# Patient Record
Sex: Female | Born: 1940 | Race: White | Hispanic: No | Smoking: Former smoker
Health system: Southern US, Community
[De-identification: ages and names within clinical notes are randomized; demographics above are authoritative.]

## PROBLEM LIST (undated history)

## (undated) DIAGNOSIS — I272 Pulmonary hypertension, unspecified: Principal | ICD-10-CM

## (undated) DIAGNOSIS — F419 Anxiety disorder, unspecified: Secondary | ICD-10-CM

## (undated) DIAGNOSIS — F329 Major depressive disorder, single episode, unspecified: Secondary | ICD-10-CM

## (undated) DIAGNOSIS — I370 Nonrheumatic pulmonary valve stenosis: Secondary | ICD-10-CM

## (undated) DIAGNOSIS — I071 Rheumatic tricuspid insufficiency: Secondary | ICD-10-CM

## (undated) DIAGNOSIS — J1282 Pneumonia due to coronavirus disease 2019: Secondary | ICD-10-CM

## (undated) DIAGNOSIS — K635 Polyp of colon: Secondary | ICD-10-CM

## (undated) DIAGNOSIS — U071 COVID-19: Secondary | ICD-10-CM

## (undated) DIAGNOSIS — M1990 Unspecified osteoarthritis, unspecified site: Secondary | ICD-10-CM

## (undated) DIAGNOSIS — G43909 Migraine, unspecified, not intractable, without status migrainosus: Secondary | ICD-10-CM

## (undated) DIAGNOSIS — K862 Cyst of pancreas: Secondary | ICD-10-CM

## (undated) DIAGNOSIS — Z8774 Personal history of (corrected) congenital malformations of heart and circulatory system: Secondary | ICD-10-CM

## (undated) DIAGNOSIS — C50912 Malignant neoplasm of unspecified site of left female breast: Secondary | ICD-10-CM

## (undated) HISTORY — DX: Migraine, unspecified, not intractable, without status migrainosus: G43.909

## (undated) HISTORY — PX: CARPAL TUNNEL RELEASE: SHX101

## (undated) HISTORY — DX: Nonrheumatic pulmonary valve stenosis: I37.0

## (undated) HISTORY — DX: Polyp of colon: K63.5

## (undated) HISTORY — DX: Anxiety disorder, unspecified: F41.9

## (undated) HISTORY — DX: Malignant neoplasm of unspecified site of left female breast: C50.912

## (undated) HISTORY — DX: Pneumonia due to coronavirus disease 2019: J12.82

## (undated) HISTORY — PX: SENTINEL LYMPH NODE BIOPSY: SHX2392

## (undated) HISTORY — DX: Major depressive disorder, single episode, unspecified: F32.9

## (undated) HISTORY — DX: COVID-19: U07.1

## (undated) HISTORY — PX: BREAST LUMPECTOMY: SHX2

## (undated) HISTORY — DX: Pulmonary hypertension, unspecified: I27.20

## (undated) HISTORY — DX: Rheumatic tricuspid insufficiency: I07.1

## (undated) HISTORY — DX: Personal history of (corrected) congenital malformations of heart and circulatory system: Z87.74

## (undated) HISTORY — DX: Cyst of pancreas: K86.2

## (undated) HISTORY — DX: Unspecified osteoarthritis, unspecified site: M19.90

---

## 2004-01-28 DIAGNOSIS — C50912 Malignant neoplasm of unspecified site of left female breast: Secondary | ICD-10-CM

## 2004-01-28 HISTORY — DX: Malignant neoplasm of unspecified site of left female breast: C50.912

## 2004-12-02 ENCOUNTER — Encounter: Admission: RE | Admit: 2004-12-02 | Discharge: 2004-12-02 | Payer: Self-pay | Admitting: Radiology

## 2004-12-05 ENCOUNTER — Encounter: Admission: RE | Admit: 2004-12-05 | Discharge: 2004-12-05 | Payer: Self-pay | Admitting: General Surgery

## 2004-12-09 ENCOUNTER — Encounter (INDEPENDENT_AMBULATORY_CARE_PROVIDER_SITE_OTHER): Payer: Self-pay | Admitting: Specialist

## 2004-12-09 ENCOUNTER — Ambulatory Visit (HOSPITAL_BASED_OUTPATIENT_CLINIC_OR_DEPARTMENT_OTHER): Admission: RE | Admit: 2004-12-09 | Discharge: 2004-12-09 | Payer: Self-pay | Admitting: General Surgery

## 2004-12-09 ENCOUNTER — Ambulatory Visit (HOSPITAL_COMMUNITY): Admission: RE | Admit: 2004-12-09 | Discharge: 2004-12-09 | Payer: Self-pay | Admitting: General Surgery

## 2004-12-13 ENCOUNTER — Ambulatory Visit: Payer: Self-pay | Admitting: Oncology

## 2005-01-15 ENCOUNTER — Ambulatory Visit (HOSPITAL_COMMUNITY): Admission: RE | Admit: 2005-01-15 | Discharge: 2005-01-15 | Payer: Self-pay | Admitting: Oncology

## 2005-01-27 ENCOUNTER — Ambulatory Visit: Admission: RE | Admit: 2005-01-27 | Discharge: 2005-04-04 | Payer: Self-pay | Admitting: Radiation Oncology

## 2005-05-08 ENCOUNTER — Ambulatory Visit: Payer: Self-pay | Admitting: Oncology

## 2005-09-05 ENCOUNTER — Ambulatory Visit: Payer: Self-pay | Admitting: Oncology

## 2005-09-09 LAB — COMPREHENSIVE METABOLIC PANEL WITH GFR
ALT: 12 U/L (ref 0–40)
AST: 19 U/L (ref 0–37)
Albumin: 4.2 g/dL (ref 3.5–5.2)
Alkaline Phosphatase: 49 U/L (ref 39–117)
BUN: 12 mg/dL (ref 6–23)
CO2: 25 meq/L (ref 19–32)
Calcium: 9.3 mg/dL (ref 8.4–10.5)
Chloride: 107 meq/L (ref 96–112)
Creatinine, Ser: 0.79 mg/dL (ref 0.40–1.20)
Glucose, Bld: 88 mg/dL (ref 70–99)
Potassium: 3.9 meq/L (ref 3.5–5.3)
Sodium: 143 meq/L (ref 135–145)
Total Bilirubin: 0.6 mg/dL (ref 0.3–1.2)
Total Protein: 6.9 g/dL (ref 6.0–8.3)

## 2005-09-09 LAB — CBC WITH DIFFERENTIAL/PLATELET
Eosinophils Absolute: 0.1 10*3/uL (ref 0.0–0.5)
MONO#: 0.4 10*3/uL (ref 0.1–0.9)
MONO%: 7.2 % (ref 0.0–13.0)
NEUT#: 3.2 10*3/uL (ref 1.5–6.5)
RBC: 4.62 10*6/uL (ref 3.70–5.32)
RDW: 13.1 % (ref 11.3–14.5)
WBC: 5.2 10*3/uL (ref 3.9–10.0)
lymph#: 1.6 10*3/uL (ref 0.9–3.3)

## 2005-09-12 ENCOUNTER — Ambulatory Visit (HOSPITAL_COMMUNITY): Admission: RE | Admit: 2005-09-12 | Discharge: 2005-09-12 | Payer: Self-pay | Admitting: Oncology

## 2006-03-03 ENCOUNTER — Ambulatory Visit: Payer: Self-pay | Admitting: Oncology

## 2006-03-06 LAB — CBC WITH DIFFERENTIAL/PLATELET
BASO%: 0.2 % (ref 0.0–2.0)
LYMPH%: 20.9 % (ref 14.0–48.0)
MCHC: 35.3 g/dL (ref 32.0–36.0)
MONO#: 0.4 10*3/uL (ref 0.1–0.9)
MONO%: 10.8 % (ref 0.0–13.0)
Platelets: 121 10*3/uL — ABNORMAL LOW (ref 145–400)
RBC: 4.42 10*6/uL (ref 3.70–5.32)
WBC: 3.4 10*3/uL — ABNORMAL LOW (ref 3.9–10.0)

## 2006-03-06 LAB — COMPREHENSIVE METABOLIC PANEL
ALT: 16 U/L (ref 0–35)
AST: 24 U/L (ref 0–37)
Alkaline Phosphatase: 42 U/L (ref 39–117)
Sodium: 140 mEq/L (ref 135–145)
Total Bilirubin: 0.4 mg/dL (ref 0.3–1.2)
Total Protein: 6.3 g/dL (ref 6.0–8.3)

## 2006-05-05 ENCOUNTER — Encounter: Admission: RE | Admit: 2006-05-05 | Discharge: 2006-05-05 | Payer: Self-pay | Admitting: Plastic Surgery

## 2006-06-09 ENCOUNTER — Ambulatory Visit: Payer: Self-pay | Admitting: Oncology

## 2006-06-11 LAB — CBC WITH DIFFERENTIAL/PLATELET
BASO%: 0.5 % (ref 0.0–2.0)
Eosinophils Absolute: 0.1 10*3/uL (ref 0.0–0.5)
LYMPH%: 28.4 % (ref 14.0–48.0)
MCHC: 34.8 g/dL (ref 32.0–36.0)
MCV: 93.2 fL (ref 81.0–101.0)
MONO%: 7.4 % (ref 0.0–13.0)
NEUT#: 3.5 10*3/uL (ref 1.5–6.5)
RBC: 4.36 10*6/uL (ref 3.70–5.32)
RDW: 13.2 % (ref 11.3–14.5)
WBC: 5.6 10*3/uL (ref 3.9–10.0)

## 2006-06-11 LAB — COMPREHENSIVE METABOLIC PANEL
ALT: 10 U/L (ref 0–35)
AST: 18 U/L (ref 0–37)
Albumin: 4.2 g/dL (ref 3.5–5.2)
Alkaline Phosphatase: 44 U/L (ref 39–117)
Glucose, Bld: 97 mg/dL (ref 70–99)
Potassium: 4 mEq/L (ref 3.5–5.3)
Sodium: 141 mEq/L (ref 135–145)
Total Bilirubin: 0.6 mg/dL (ref 0.3–1.2)
Total Protein: 6.7 g/dL (ref 6.0–8.3)

## 2006-06-29 ENCOUNTER — Ambulatory Visit: Payer: Self-pay | Admitting: Psychiatry

## 2006-09-11 ENCOUNTER — Ambulatory Visit: Payer: Self-pay | Admitting: Oncology

## 2006-09-16 LAB — CBC WITH DIFFERENTIAL/PLATELET
BASO%: 0.5 % (ref 0.0–2.0)
EOS%: 1 % (ref 0.0–7.0)
HCT: 39.4 % (ref 34.8–46.6)
LYMPH%: 28 % (ref 14.0–48.0)
MCH: 32.9 pg (ref 26.0–34.0)
MCHC: 34.8 g/dL (ref 32.0–36.0)
MCV: 94.6 fL (ref 81.0–101.0)
MONO%: 4.7 % (ref 0.0–13.0)
NEUT%: 65.8 % (ref 39.6–76.8)
Platelets: 150 10*3/uL (ref 145–400)
RBC: 4.17 10*6/uL (ref 3.70–5.32)
WBC: 6 10*3/uL (ref 3.9–10.0)

## 2006-09-17 LAB — COMPREHENSIVE METABOLIC PANEL
ALT: 14 U/L (ref 0–35)
Alkaline Phosphatase: 58 U/L (ref 39–117)
CO2: 23 mEq/L (ref 19–32)
Creatinine, Ser: 0.67 mg/dL (ref 0.40–1.20)
Sodium: 142 mEq/L (ref 135–145)
Total Bilirubin: 0.5 mg/dL (ref 0.3–1.2)
Total Protein: 6.1 g/dL (ref 6.0–8.3)

## 2006-09-17 LAB — CANCER ANTIGEN 27.29: CA 27.29: 10 U/mL (ref 0–39)

## 2006-11-06 ENCOUNTER — Ambulatory Visit: Payer: Self-pay | Admitting: Oncology

## 2006-11-10 LAB — CBC WITH DIFFERENTIAL/PLATELET
BASO%: 0.5 % (ref 0.0–2.0)
Basophils Absolute: 0 10*3/uL (ref 0.0–0.1)
EOS%: 0.7 % (ref 0.0–7.0)
HGB: 14.3 g/dL (ref 11.6–15.9)
MCH: 32.5 pg (ref 26.0–34.0)
MCHC: 35 g/dL (ref 32.0–36.0)
MCV: 92.9 fL (ref 81.0–101.0)
MONO%: 7.6 % (ref 0.0–13.0)
RBC: 4.38 10*6/uL (ref 3.70–5.32)
RDW: 13.1 % (ref 11.3–14.5)
lymph#: 1.5 10*3/uL (ref 0.9–3.3)

## 2006-11-10 LAB — COMPREHENSIVE METABOLIC PANEL
ALT: 20 U/L (ref 0–35)
AST: 32 U/L (ref 0–37)
Albumin: 3.6 g/dL (ref 3.5–5.2)
Alkaline Phosphatase: 45 U/L (ref 39–117)
BUN: 13 mg/dL (ref 6–23)
Potassium: 3.6 mEq/L (ref 3.5–5.3)

## 2006-11-11 ENCOUNTER — Ambulatory Visit (HOSPITAL_COMMUNITY): Admission: RE | Admit: 2006-11-11 | Discharge: 2006-11-11 | Payer: Self-pay | Admitting: Oncology

## 2007-04-16 ENCOUNTER — Ambulatory Visit: Payer: Self-pay | Admitting: Oncology

## 2007-04-20 LAB — CBC WITH DIFFERENTIAL/PLATELET
Basophils Absolute: 0 10*3/uL (ref 0.0–0.1)
Eosinophils Absolute: 0 10*3/uL (ref 0.0–0.5)
HCT: 42.4 % (ref 34.8–46.6)
HGB: 14.8 g/dL (ref 11.6–15.9)
LYMPH%: 31.5 % (ref 14.0–48.0)
MCHC: 34.8 g/dL (ref 32.0–36.0)
MONO#: 0.3 10*3/uL (ref 0.1–0.9)
NEUT#: 3.5 10*3/uL (ref 1.5–6.5)
NEUT%: 61.6 % (ref 39.6–76.8)
Platelets: 167 10*3/uL (ref 145–400)
WBC: 5.7 10*3/uL (ref 3.9–10.0)

## 2007-04-20 LAB — COMPREHENSIVE METABOLIC PANEL
BUN: 7 mg/dL (ref 6–23)
CO2: 27 mEq/L (ref 19–32)
Calcium: 8.9 mg/dL (ref 8.4–10.5)
Creatinine, Ser: 0.71 mg/dL (ref 0.40–1.20)
Glucose, Bld: 96 mg/dL (ref 70–99)
Total Bilirubin: 0.9 mg/dL (ref 0.3–1.2)

## 2007-04-20 LAB — CANCER ANTIGEN 27.29: CA 27.29: 12 U/mL (ref 0–39)

## 2007-06-28 ENCOUNTER — Ambulatory Visit (HOSPITAL_COMMUNITY): Admission: RE | Admit: 2007-06-28 | Discharge: 2007-06-28 | Payer: Self-pay | Admitting: Oncology

## 2007-10-22 ENCOUNTER — Ambulatory Visit: Payer: Self-pay | Admitting: Oncology

## 2007-10-26 LAB — CBC WITH DIFFERENTIAL/PLATELET
Basophils Absolute: 0 10*3/uL (ref 0.0–0.1)
EOS%: 0.9 % (ref 0.0–7.0)
Eosinophils Absolute: 0 10*3/uL (ref 0.0–0.5)
HGB: 13.9 g/dL (ref 11.6–15.9)
LYMPH%: 28.2 % (ref 14.0–48.0)
MCH: 32.4 pg (ref 26.0–34.0)
MCV: 94.8 fL (ref 81.0–101.0)
MONO%: 5.7 % (ref 0.0–13.0)
NEUT#: 3.6 10*3/uL (ref 1.5–6.5)
Platelets: 135 10*3/uL — ABNORMAL LOW (ref 145–400)

## 2007-10-26 LAB — COMPREHENSIVE METABOLIC PANEL
AST: 24 U/L (ref 0–37)
Albumin: 3.6 g/dL (ref 3.5–5.2)
Alkaline Phosphatase: 44 U/L (ref 39–117)
BUN: 12 mg/dL (ref 6–23)
Potassium: 3.9 mEq/L (ref 3.5–5.3)
Total Bilirubin: 0.9 mg/dL (ref 0.3–1.2)

## 2007-10-28 ENCOUNTER — Encounter: Admission: RE | Admit: 2007-10-28 | Discharge: 2007-10-28 | Payer: Self-pay | Admitting: Oncology

## 2008-03-28 ENCOUNTER — Ambulatory Visit: Payer: Self-pay | Admitting: Oncology

## 2008-03-30 LAB — COMPREHENSIVE METABOLIC PANEL
ALT: 11 U/L (ref 0–35)
AST: 18 U/L (ref 0–37)
Albumin: 4.1 g/dL (ref 3.5–5.2)
Alkaline Phosphatase: 40 U/L (ref 39–117)
BUN: 13 mg/dL (ref 6–23)
Calcium: 8.9 mg/dL (ref 8.4–10.5)
Chloride: 105 mEq/L (ref 96–112)
Creatinine, Ser: 0.72 mg/dL (ref 0.40–1.20)
Potassium: 3.8 mEq/L (ref 3.5–5.3)

## 2008-03-30 LAB — CBC WITH DIFFERENTIAL/PLATELET
BASO%: 0.1 % (ref 0.0–2.0)
Basophils Absolute: 0 10*3/uL (ref 0.0–0.1)
EOS%: 1.1 % (ref 0.0–7.0)
MCH: 32.2 pg (ref 25.1–34.0)
MCHC: 33.9 g/dL (ref 31.5–36.0)
MCV: 94.9 fL (ref 79.5–101.0)
MONO%: 6 % (ref 0.0–14.0)
RDW: 13.2 % (ref 11.2–14.5)
lymph#: 2 10*3/uL (ref 0.9–3.3)

## 2008-10-31 ENCOUNTER — Ambulatory Visit: Payer: Self-pay | Admitting: Oncology

## 2008-11-02 ENCOUNTER — Ambulatory Visit (HOSPITAL_COMMUNITY): Admission: RE | Admit: 2008-11-02 | Discharge: 2008-11-02 | Payer: Self-pay | Admitting: Oncology

## 2008-11-02 LAB — CBC WITH DIFFERENTIAL/PLATELET
BASO%: 0.3 % (ref 0.0–2.0)
Basophils Absolute: 0 10*3/uL (ref 0.0–0.1)
EOS%: 1.1 % (ref 0.0–7.0)
HCT: 40.1 % (ref 34.8–46.6)
HGB: 13.9 g/dL (ref 11.6–15.9)
MCH: 32.9 pg (ref 25.1–34.0)
MCHC: 34.7 g/dL (ref 31.5–36.0)
MCV: 95 fL (ref 79.5–101.0)
MONO%: 7.5 % (ref 0.0–14.0)
NEUT%: 60.3 % (ref 38.4–76.8)
RDW: 13.3 % (ref 11.2–14.5)
lymph#: 1.9 10*3/uL (ref 0.9–3.3)

## 2008-11-03 LAB — COMPREHENSIVE METABOLIC PANEL
ALT: 14 U/L (ref 0–35)
AST: 23 U/L (ref 0–37)
Alkaline Phosphatase: 38 U/L — ABNORMAL LOW (ref 39–117)
BUN: 14 mg/dL (ref 6–23)
Creatinine, Ser: 0.79 mg/dL (ref 0.40–1.20)

## 2008-11-03 LAB — VITAMIN D 25 HYDROXY (VIT D DEFICIENCY, FRACTURES): Vit D, 25-Hydroxy: 41 ng/mL (ref 30–89)

## 2009-10-29 ENCOUNTER — Ambulatory Visit: Payer: Self-pay | Admitting: Oncology

## 2009-10-31 ENCOUNTER — Ambulatory Visit (HOSPITAL_COMMUNITY): Admission: RE | Admit: 2009-10-31 | Discharge: 2009-10-31 | Payer: Self-pay | Admitting: Oncology

## 2009-10-31 LAB — CBC WITH DIFFERENTIAL/PLATELET
BASO%: 0.4 % (ref 0.0–2.0)
Basophils Absolute: 0 10*3/uL (ref 0.0–0.1)
EOS%: 1.5 % (ref 0.0–7.0)
Eosinophils Absolute: 0.1 10*3/uL (ref 0.0–0.5)
HCT: 40.5 % (ref 34.8–46.6)
HGB: 14 g/dL (ref 11.6–15.9)
LYMPH%: 31.2 % (ref 14.0–49.7)
MCH: 32.9 pg (ref 25.1–34.0)
MCHC: 34.6 g/dL (ref 31.5–36.0)
MCV: 95 fL (ref 79.5–101.0)
MONO#: 0.3 10*3/uL (ref 0.1–0.9)
MONO%: 7 % (ref 0.0–14.0)
NEUT#: 3 10*3/uL (ref 1.5–6.5)
NEUT%: 59.9 % (ref 38.4–76.8)
Platelets: 159 10*3/uL (ref 145–400)
RBC: 4.26 10*6/uL (ref 3.70–5.45)
RDW: 13.3 % (ref 11.2–14.5)
WBC: 5 10*3/uL (ref 3.9–10.3)
lymph#: 1.5 10*3/uL (ref 0.9–3.3)

## 2009-10-31 LAB — COMPREHENSIVE METABOLIC PANEL
ALT: 17 U/L (ref 0–35)
AST: 27 U/L (ref 0–37)
Albumin: 3.5 g/dL (ref 3.5–5.2)
Alkaline Phosphatase: 44 U/L (ref 39–117)
BUN: 11 mg/dL (ref 6–23)
CO2: 28 mEq/L (ref 19–32)
Calcium: 9.1 mg/dL (ref 8.4–10.5)
Chloride: 108 mEq/L (ref 96–112)
Creatinine, Ser: 0.74 mg/dL (ref 0.40–1.20)
Glucose, Bld: 92 mg/dL (ref 70–99)
Potassium: 4.1 mEq/L (ref 3.5–5.3)
Sodium: 141 mEq/L (ref 135–145)
Total Bilirubin: 0.8 mg/dL (ref 0.3–1.2)
Total Protein: 6.2 g/dL (ref 6.0–8.3)

## 2009-10-31 LAB — VITAMIN D 25 HYDROXY (VIT D DEFICIENCY, FRACTURES): Vit D, 25-Hydroxy: 62 ng/mL (ref 30–89)

## 2009-10-31 LAB — CANCER ANTIGEN 27.29: CA 27.29: 16 U/mL (ref 0–39)

## 2010-02-08 ENCOUNTER — Encounter: Payer: Self-pay | Admitting: Cardiology

## 2010-02-15 ENCOUNTER — Encounter: Payer: Self-pay | Admitting: Cardiology

## 2010-02-16 ENCOUNTER — Encounter: Payer: Self-pay | Admitting: Radiology

## 2010-02-22 ENCOUNTER — Encounter: Payer: Self-pay | Admitting: Cardiology

## 2010-04-02 ENCOUNTER — Encounter (HOSPITAL_BASED_OUTPATIENT_CLINIC_OR_DEPARTMENT_OTHER): Payer: Medicare Other | Admitting: Oncology

## 2010-04-02 ENCOUNTER — Other Ambulatory Visit: Payer: Self-pay | Admitting: Oncology

## 2010-04-02 DIAGNOSIS — Z170 Estrogen receptor positive status [ER+]: Secondary | ICD-10-CM

## 2010-04-02 DIAGNOSIS — C50419 Malignant neoplasm of upper-outer quadrant of unspecified female breast: Secondary | ICD-10-CM

## 2010-04-02 LAB — COMPREHENSIVE METABOLIC PANEL
ALT: 14 U/L (ref 0–35)
AST: 20 U/L (ref 0–37)
Alkaline Phosphatase: 59 U/L (ref 39–117)
BUN: 10 mg/dL (ref 6–23)
CO2: 23 mEq/L (ref 19–32)
Calcium: 9.1 mg/dL (ref 8.4–10.5)
Glucose, Bld: 110 mg/dL — ABNORMAL HIGH (ref 70–99)
Potassium: 3.8 mEq/L (ref 3.5–5.3)
Sodium: 141 mEq/L (ref 135–145)
Total Bilirubin: 0.4 mg/dL (ref 0.3–1.2)
Total Protein: 6.2 g/dL (ref 6.0–8.3)

## 2010-04-02 LAB — CBC WITH DIFFERENTIAL/PLATELET
BASO%: 0.8 % (ref 0.0–2.0)
Basophils Absolute: 0 10*3/uL (ref 0.0–0.1)
EOS%: 3.2 % (ref 0.0–7.0)
Eosinophils Absolute: 0.2 10*3/uL (ref 0.0–0.5)
HCT: 40.2 % (ref 34.8–46.6)
LYMPH%: 36.4 % (ref 14.0–49.7)
MCH: 32.6 pg (ref 25.1–34.0)
MCHC: 34.6 g/dL (ref 31.5–36.0)
MCV: 94.3 fL (ref 79.5–101.0)
MONO#: 0.3 10*3/uL (ref 0.1–0.9)
MONO%: 6.6 % (ref 0.0–14.0)
NEUT#: 2.8 10*3/uL (ref 1.5–6.5)
NEUT%: 53 % (ref 38.4–76.8)
Platelets: 161 10*3/uL (ref 145–400)
RBC: 4.26 10*6/uL (ref 3.70–5.45)
WBC: 5.3 10*3/uL (ref 3.9–10.3)
lymph#: 1.9 10*3/uL (ref 0.9–3.3)

## 2010-04-02 LAB — CANCER ANTIGEN 27.29: CA 27.29: 17 U/mL (ref 0–39)

## 2010-04-09 ENCOUNTER — Encounter (HOSPITAL_BASED_OUTPATIENT_CLINIC_OR_DEPARTMENT_OTHER): Payer: Medicare Other | Admitting: Oncology

## 2010-04-09 DIAGNOSIS — Z170 Estrogen receptor positive status [ER+]: Secondary | ICD-10-CM

## 2010-04-09 DIAGNOSIS — C50419 Malignant neoplasm of upper-outer quadrant of unspecified female breast: Secondary | ICD-10-CM

## 2010-05-02 LAB — CREATININE, SERUM
Creatinine, Ser: 0.76 mg/dL (ref 0.4–1.2)
GFR calc Af Amer: >60 mL/min (ref >60)
GFR calc non Af Amer: >60 mL/min (ref >60)

## 2010-05-10 ENCOUNTER — Encounter: Payer: Self-pay | Admitting: Cardiology

## 2010-05-13 ENCOUNTER — Ambulatory Visit (INDEPENDENT_AMBULATORY_CARE_PROVIDER_SITE_OTHER): Payer: Medicare Other | Admitting: Cardiology

## 2010-05-13 ENCOUNTER — Ambulatory Visit: Payer: Medicare Other | Admitting: Cardiology

## 2010-05-13 ENCOUNTER — Encounter: Payer: Self-pay | Admitting: Cardiology

## 2010-05-13 VITALS — BP 120/82 | HR 63 | Resp 12 | Ht 64.0 in | Wt 151.0 lb

## 2010-05-13 DIAGNOSIS — Z9889 Other specified postprocedural states: Secondary | ICD-10-CM

## 2010-05-13 DIAGNOSIS — Z8774 Personal history of (corrected) congenital malformations of heart and circulatory system: Secondary | ICD-10-CM

## 2010-05-13 DIAGNOSIS — I272 Pulmonary hypertension, unspecified: Secondary | ICD-10-CM

## 2010-05-13 DIAGNOSIS — I370 Nonrheumatic pulmonary valve stenosis: Secondary | ICD-10-CM

## 2010-05-13 DIAGNOSIS — I071 Rheumatic tricuspid insufficiency: Secondary | ICD-10-CM

## 2010-05-13 DIAGNOSIS — I379 Nonrheumatic pulmonary valve disorder, unspecified: Secondary | ICD-10-CM

## 2010-05-13 DIAGNOSIS — I2789 Other specified pulmonary heart diseases: Secondary | ICD-10-CM

## 2010-05-13 DIAGNOSIS — I079 Rheumatic tricuspid valve disease, unspecified: Secondary | ICD-10-CM

## 2010-05-13 HISTORY — DX: Rheumatic tricuspid insufficiency: I07.1

## 2010-05-13 HISTORY — DX: Pulmonary hypertension, unspecified: I27.20

## 2010-05-13 HISTORY — DX: Nonrheumatic pulmonary valve stenosis: I37.0

## 2010-05-13 NOTE — Progress Notes (Signed)
   [name], female    DOB: 12-08-1940, 70 y.o.   MRN: [id number]  HPI Grace Fletcher is referred today by Dr Darnelle Catalan to establish with [place] as her cardiologist. She has a history of congenital Pulmonary stenosis and is S/P ASD closure at [place] about 10 years ago. Other than intermittent dependent edema, she is asx. Her last Echo was a stress study, 02/15/10, showed a pulmonary pressure of 40-73mm with moderate tricuspid regurgitation, no ischemia, relatively good exercise tolerance. Her blood work recently I reviewed and she has a TC/HDL of <3.  EKG shows NSR, possible LAE, mild LAD, otherwise normal.   Review of Systems  All other systems reviewed and are negative.      Physical Exam  Constitutional: She is oriented to person, place, and time. She appears well-developed and well-nourished.  HENT:  Head: Normocephalic and atraumatic.  Eyes: EOM are normal. Pupils are equal, round, and reactive to light.  Neck: Neck supple. No JVD present. No tracheal deviation present. No thyromegaly present.  Cardiovascular: Normal rate, regular rhythm, S1 normal and S2 normal.   No extrasystoles are present. PMI is not displaced.  Exam reveals no gallop, no S3 and no distant heart sounds.   Murmur heard.  Crescendo systolic murmur is present with a grade of 2/6  Pulmonary/Chest: Effort normal and breath sounds normal.  Abdominal: Soft. Bowel sounds are normal.  Musculoskeletal: Normal range of motion. She exhibits no edema.  Neurological: She is alert and oriented to person, place, and time.  Skin: Skin is warm and dry.  Psychiatric: She has a normal mood and affect.

## 2010-05-13 NOTE — Assessment & Plan Note (Signed)
Moderate but asx except intermittent pedal edema. Leg elevation, no diuretics indicated at this time. Follow up in one year.

## 2010-05-13 NOTE — Patient Instructions (Signed)
Your physician recommends that you schedule a follow-up appointment in: 1 year with Dr. Wall  

## 2010-06-14 NOTE — Op Note (Signed)
Grace Fletcher, Grace Fletcher             ACCOUNT NO.:  [id number]   MEDICAL RECORD NO.:  [id number]          PATIENT TYPE:  AMB   LOCATION:  DSC                          FACILITY:  [facility]   PHYSICIAN:  Angelia Mould. Derrell Lolling, M.D.DATE OF BIRTH:  03-09-1940   DATE OF PROCEDURE:  12/09/2004  DATE OF DISCHARGE:                                 OPERATIVE REPORT   PREOPERATIVE DIAGNOSIS:  Cancer of the left breast.   POSTOPERATIVE DIAGNOSIS:  Cancer of the left breast.   OPERATION PERFORMED:  1.  Injection of blue dye, left breast.  2.  Left partial mastectomy.  3.  Left sentinel lymph node biopsy.   SURGEON:  Angelia Mould. Derrell Lolling, M.D.   OPERATIVE INDICATIONS:  This is a 70 year old white female who had felt some  thickening in the upper outer quadrant of her left breast.  She had  mammograms and ultrasound and ultrasound guided biopsy and subsequently had  an MRI.  She has a biopsy proven invasive carcinoma thought to be either  infiltrating lobular versus invasive ductal with lobular features.  She has  no sign of any cancer elsewhere.  MRI and gamma imaging showed a solitary  focus in the upper outer quadrant of the left breast.  On exam, she has a  palpable mass about 1.5 to 2 cm in diameter.  Her breasts are small.  The  mass is not fixed to the skin and does not appear fixed to the chest wall.  She, very much, wanted breast conservation.  This has been discussed in  breast multi-disciplinary conference.  The consensus view was that he was a  good candidate for breast conservation.  She is brought to the operating  room electively.   OPERATIVE FINDINGS:  There were two hot and blue sentinel nodes.  One of  these was negative for cancer and the second had a few, very minimally,  atypical cells but nothing that could be called cancer.  The axilla was very  small, very little tissue present.  In the left breast, we were able to  excise the tumor with an ellipse of skin on top of it and get  negative  margins all the way around, although the deep margin was close, probably 1-2  mm in distance.  The dissection was taken all the way down to the pectoralis  major muscle.   SURGICAL TECHNIQUE:  The patient underwent injection of radionuclide in her  left breast by the nuclear medicine department preoperatively.  She was  brought to the operating room and placed supine on the operating table.  A  suitable general LMA anesthetic was induced.  The breast was prepped with  alcohol and I injected dilute methylene blue dye in the subareolar area and  massaged the breast for 4-5 minutes.  The left breast and left axilla were  then prepped and draped in a sterile fashion.  Using the Neoprobe, I  identified an area of focal increased activity in the axilla.  A transverse  incision was made lateral to the lateral border of the pectoralis major  muscle.  Dissection was carried down  through the subcutaneous tissue.  The  clavipectoral fascia was incised.  I found two very hot, very blue lymph  nodes, both of which were removed.  The vascular and lymphatic channels were  controlled with metal clips.  After these nodes were removed, there was no  significant radioactivity in the axilla.  In print cytology of these nodes  showed no evidence of any cancer cells, although in one of the nodes, there  were a few atypical cells.  I discussed this with Dr. Laureen Ochs and neither nor I  felt that there was enough indication to consider axillary dissection.  This  wound was irrigated with saline.  Hemostasis was excellent.  The  subcutaneous tissues were closed with interrupted sutures of 3-0 Vicryl and  the skin was closed with skin staples.   We turned our attention to the palpable mass in the upper outer quadrant of  the left breast.  I chose to make a transverse radially oriented elliptical  incision excising about a 1.5 cm ellipse of skin overlying the tumor.  Dissection was carried circumferentially  around the palpable mass using  electrocautery.  This took the dissection all the way down to the pectoralis  major muscle where the breast tissue and tumor were taken off the pectoralis  major muscle and palpably and visually, it looked like we were all the way  around everything.  In print cytology was negative for cancer cells.  Dr.  Laureen Ochs felt that we were probably 1-2 mm margin at the deep margin and  excellent margins elsewhere.  He felt the margin was pathologically  negative.  This wound was irrigated with saline.  There was excellent  hemostasis.  The skin was closed with interrupted sutures of 4-0 nylon.  The  wounds were cleansed, dried, covered with bulky bandages and a 6 inch Ace  wrap wrapped around the patient's chest wall and ice packs were placed.  The  patient tolerated the procedure well and was taken to the recovery room in  stable condition.  Estimated blood loss was about 15 mL.  Complications  none.  Sponge and instrument counts were correct.      Angelia Mould. Derrell Lolling, M.D.  Electronically Signed     HMI/MEDQ  D:  12/09/2004  T:  12/09/2004  Job:  [id number]   cc:   Lowell Bouton, M.D.  Fax: [phone]   Jeralyn Ruths, M.D.   Macarthur Critchley Shelva Majestic, M.D.  Fax: [phone]

## 2010-07-08 ENCOUNTER — Encounter: Payer: Self-pay | Admitting: Cardiology

## 2010-07-19 ENCOUNTER — Telehealth: Payer: Self-pay | Admitting: Cardiology

## 2010-07-19 NOTE — Telephone Encounter (Signed)
LEFT MESSAGE FOR SUZANNE RE PT WILL FORWARD TO DR WALL FOR REVIEW./CY

## 2010-07-19 NOTE — Telephone Encounter (Signed)
Pt is having surgery on Wed 6/27 and they need a note faxed stating if pt needs to keep taking or stop ASA and the surgery is a hysteroscopy D&C and they need this note faxed to [phone] Dr. Vincente Poli is doing surgery

## 2010-07-19 NOTE — Telephone Encounter (Signed)
Faxed LOV & EKG to [facility] [place] ([phone]).

## 2011-03-21 ENCOUNTER — Telehealth: Payer: Self-pay | Admitting: Oncology

## 2011-03-21 NOTE — Telephone Encounter (Signed)
lmonvm adviisng the pt of her march 2013 appts °

## 2011-03-25 DIAGNOSIS — S91309A Unspecified open wound, unspecified foot, initial encounter: Secondary | ICD-10-CM | POA: Diagnosis not present

## 2011-04-16 ENCOUNTER — Other Ambulatory Visit (HOSPITAL_BASED_OUTPATIENT_CLINIC_OR_DEPARTMENT_OTHER): Payer: Medicare Other | Admitting: Lab

## 2011-04-16 DIAGNOSIS — Z170 Estrogen receptor positive status [ER+]: Secondary | ICD-10-CM

## 2011-04-16 DIAGNOSIS — C50419 Malignant neoplasm of upper-outer quadrant of unspecified female breast: Secondary | ICD-10-CM

## 2011-04-16 LAB — CBC WITH DIFFERENTIAL/PLATELET
Basophils Absolute: 0 10*3/uL (ref 0.0–0.1)
EOS%: 1.3 % (ref 0.0–7.0)
HCT: 42.4 % (ref 34.8–46.6)
HGB: 14.2 g/dL (ref 11.6–15.9)
MCH: 31.7 pg (ref 25.1–34.0)
MCHC: 33.4 g/dL (ref 31.5–36.0)
MCV: 94.7 fL (ref 79.5–101.0)
MONO%: 8.7 % (ref 0.0–14.0)
NEUT%: 54.7 % (ref 38.4–76.8)
RDW: 13.5 % (ref 11.2–14.5)

## 2011-04-17 LAB — COMPREHENSIVE METABOLIC PANEL
ALT: 9 U/L (ref 0–35)
Albumin: 4.1 g/dL (ref 3.5–5.2)
CO2: 21 mEq/L (ref 19–32)
Calcium: 9.3 mg/dL (ref 8.4–10.5)
Chloride: 107 mEq/L (ref 96–112)
Glucose, Bld: 92 mg/dL (ref 70–99)
Total Protein: 6.3 g/dL (ref 6.0–8.3)

## 2011-04-17 LAB — VITAMIN D 25 HYDROXY (VIT D DEFICIENCY, FRACTURES): Vit D, 25-Hydroxy: 58 ng/mL (ref 30–89)

## 2011-04-17 LAB — CANCER ANTIGEN 27.29: CA 27.29: 14 U/mL (ref 0–39)

## 2011-04-23 ENCOUNTER — Ambulatory Visit (HOSPITAL_BASED_OUTPATIENT_CLINIC_OR_DEPARTMENT_OTHER): Payer: Medicare Other | Admitting: Oncology

## 2011-04-23 ENCOUNTER — Telehealth: Payer: Self-pay | Admitting: *Deleted

## 2011-04-23 VITALS — BP 126/76 | HR 87 | Temp 98.2°F | Ht 64.0 in | Wt 147.7 lb

## 2011-04-23 DIAGNOSIS — Z853 Personal history of malignant neoplasm of breast: Secondary | ICD-10-CM | POA: Insufficient documentation

## 2011-04-23 DIAGNOSIS — C50919 Malignant neoplasm of unspecified site of unspecified female breast: Secondary | ICD-10-CM | POA: Diagnosis not present

## 2011-04-23 DIAGNOSIS — C50912 Malignant neoplasm of unspecified site of left female breast: Secondary | ICD-10-CM | POA: Insufficient documentation

## 2011-04-23 NOTE — Telephone Encounter (Signed)
gave patient appointment for 03-2012

## 2011-04-23 NOTE — Progress Notes (Signed)
ID: Grace Fletcher Offer   DOB: 31-Jan-1940  MR#: [id number]  WUJ#:[id number]  HISTORY OF PRESENT ILLNESS: The patient moved to this area from [place] about 2004 and neglected her health maintenance.  She eventually had problems with the right carpal tunnel and was evaluated by Dr. Shirlee Latch for this.  Dr. Metro Kung went beyond the immediate issue and set the patient up for a mammogram at [facility] on 11/21/2004.  This showed a suspicious mass in the left upper outer quadrant.  The next day, the patient was brought back for further evaluation and had breast specific gamma imaging, which showed in the extreme upper outer quadrant of the left breast 1.8 cm focus of intense activity.  There was no axillary activity noted.  An ultrasound confirmed the presence of the mass and this was biopsied the same day 11/22/2004.  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  With this information, the patient was referred to Dr. Derrell Lolling and after appropriate discussion, he proceeded to left lumpectomy with a sentinel lymph node sampling on 12/09/2004.  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  INTERVAL HISTORY: Since her last visit here the interval history has been unremarkable. The patient tells me she had a D&C under Dr. Vincente Poli a year ago and that she is due for repeat ultrasonography.  REVIEW OF SYSTEMS: The biggest problem Grace Fletcher is having his worsening arthritis, particularly involving the hands. She has painful joints elsewhere as well, including the jaw and knees. This makes it hard for her to open jars [name] simple activities. She is using glucosamine for this  with little success. She continues to have of course her heart problems and some shortness of breath with a mild dry cough but this is not a new issue. Her hot flashes are mild. A detailed review systems was otherwise noncontributory  PAST MEDICAL HISTORY: Past Medical History  Diagnosis Date  . Breast cancer   . Cyst of pancreas   . Pulmonary hypertension 05/13/2010  Significant for remote history of migraines, history of patent foramen ovale repair approximately 2002 after what may have been small strokes with no residual, history of palpitation secondary to the "patch" which is now much better controlled on Toprol, history of carpal tunnel repair, history of hemorrhoidectomy, history of colon polyp removal approximately 15 years ago, history of osteopenia, and history of mild osteoarthritis.  PAST SURGICAL HISTORY: Past Surgical History  Procedure Date  . Breast lumpectomy     left  . Sentinel lymph node biopsy     FAMILY HISTORY The patient's father died at the age of 32 from a myocardial infarction.  The patient's mother died at the age of nearly 84.  The patient has 1 sister, others no history of breast or ovarian cancer in the family.  GYNECOLOGIC HISTORY: She is  GX P4,  change of life  at age 71.  She took Prempro until she had her heart surgery approximately 2002  SOCIAL HISTORY: The patient worked as a Diplomatic Services operational officer, but is now retired, as is her husband [name] (goes by [employer]")   He was a Environmental education officer.  They  have 4 children.  Daughter [name] in [place] works in Chief Financial Officer, daughter Research scientist (physical sciences) in [place] works in administration, son [name] in [place] is a Proofreader with the [employer] and son [name] in  [place], [place] is a Comptroller.  The patient has 1 grandchild and 1 great grandchild.  She attends the [employer].   ADVANCED DIRECTIVES:  HEALTH MAINTENANCE: History  Substance Use Topics  . Smoking status: Former Games developer  . Smokeless tobacco: Not  on file  . Alcohol Use: Not on file     Colonoscopy:  PAP:  Bone density:  Lipid panel:  No Known Allergies  Current Outpatient Prescriptions  Medication Sig Dispense Refill  . aspirin 81 MG tablet Take 81 mg by mouth daily.        . citalopram (CELEXA) 40 MG tablet Take 40 mg by mouth daily.        Marland Kitchen glucosamine-chondroitin 500-400 MG tablet Take 1 tablet by mouth 3 (three) times daily.      . Multiple Vitamin (MULTIVITAMIN) capsule Take 1 capsule by mouth daily.          OBJECTIVE: Middle-aged white woman in no acute distress Filed Vitals:   04/23/11 1059  BP: 126/76  Pulse: 87  Temp: 98.2 F (36.8 C)     Body mass index is 25.35 kg/(m^2).    ECOG FS: 1  Sclerae unicteric Oropharynx clear No peripheral adenopathy Lungs no rales or rhonchi Heart regular rate and rhythm Abd benign MSK no focal spinal tenderness, no peripheral edema Neuro: nonfocal Breasts: The right breast is unremarkable; the left breast is status post lumpectomy; there is no evidence of local recurrence  LAB RESULTS: Lab Results  Component Value Date   WBC 4.8 04/16/2011   NEUTROABS 2.6 04/16/2011   HGB 14.2 04/16/2011   HCT 42.4 04/16/2011   MCV 94.7 04/16/2011   PLT 168 04/16/2011      Chemistry      Component Value Date/Time   NA 140 04/16/2011 1311   K 4.2 04/16/2011 1311   CL 107 04/16/2011 1311   CO2 21 04/16/2011 1311   BUN 14 04/16/2011 1311   CREATININE 0.64 04/16/2011 1311      Component Value Date/Time   CALCIUM 9.3 04/16/2011 1311   ALKPHOS 63 04/16/2011 1311   AST 21 04/16/2011 1311   ALT 9 04/16/2011 1311   BILITOT 0.7 04/16/2011 1311       Lab Results  Component Value Date   LABCA2 14 04/16/2011    No components found with this basename: [id number]    No results found for this basename: INR:1;PROTIME:1 in the last 168 hours  Urinalysis No results found for this basename: colorurine, appearanceur, labspec, phurine, glucoseu, hgbur, bilirubinur, ketonesur, proteinur,  urobilinogen, nitrite, leukocytesur    STUDIES: No new results found. Her bone density at [facility] showed osteopenia with a T score of -1.9 at the left femoral neck. 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   PLAN: She stopped tamoxifen a year ago, and decided against going on aromatase inhibitors. I am going to see her on a yearly basis for 2 more years  until she completes her 10 years of followup. From a breast cancer point of view she is doing great. She knows to call for any problems that may develop before the next visit   Paquita Printy C    04/23/2011

## 2011-07-03 DIAGNOSIS — Z79899 Other long term (current) drug therapy: Secondary | ICD-10-CM | POA: Diagnosis not present

## 2011-07-17 ENCOUNTER — Other Ambulatory Visit: Payer: Self-pay | Admitting: Orthopedic Surgery

## 2011-07-17 ENCOUNTER — Other Ambulatory Visit (HOSPITAL_COMMUNITY): Payer: Self-pay | Admitting: Orthopedic Surgery

## 2011-07-17 DIAGNOSIS — M546 Pain in thoracic spine: Secondary | ICD-10-CM

## 2011-07-17 DIAGNOSIS — IMO0002 Reserved for concepts with insufficient information to code with codable children: Secondary | ICD-10-CM | POA: Diagnosis not present

## 2011-07-21 ENCOUNTER — Ambulatory Visit
Admission: RE | Admit: 2011-07-21 | Discharge: 2011-07-21 | Disposition: A | Discharge status: Home / Self Care | Payer: Medicare Other | Source: Ambulatory Visit | Attending: Orthopedic Surgery | Admitting: Orthopedic Surgery

## 2011-07-21 DIAGNOSIS — R079 Chest pain, unspecified: Secondary | ICD-10-CM | POA: Diagnosis not present

## 2011-07-21 DIAGNOSIS — IMO0002 Reserved for concepts with insufficient information to code with codable children: Secondary | ICD-10-CM

## 2011-07-21 MED ORDER — IOHEXOL 300 MG/ML  SOLN
75.0000 mL | Freq: Once | INTRAMUSCULAR | Status: AC | PRN
  Administered 2011-07-21: 75 mL via INTRAVENOUS

## 2011-07-24 ENCOUNTER — Encounter (HOSPITAL_COMMUNITY): Payer: Medicare Other

## 2011-07-24 ENCOUNTER — Ambulatory Visit (HOSPITAL_COMMUNITY): Payer: Medicare Other

## 2011-07-25 ENCOUNTER — Encounter (HOSPITAL_COMMUNITY)
Admission: RE | Admit: 2011-07-25 | Discharge: 2011-07-25 | Disposition: A | Discharge status: Home / Self Care | Payer: Medicare Other | Source: Ambulatory Visit | Attending: Orthopedic Surgery | Admitting: Orthopedic Surgery

## 2011-07-25 ENCOUNTER — Ambulatory Visit (HOSPITAL_COMMUNITY): Payer: Medicare Other

## 2011-07-25 DIAGNOSIS — M549 Dorsalgia, unspecified: Secondary | ICD-10-CM | POA: Diagnosis not present

## 2011-07-25 DIAGNOSIS — M546 Pain in thoracic spine: Secondary | ICD-10-CM | POA: Diagnosis not present

## 2011-07-25 DIAGNOSIS — C50919 Malignant neoplasm of unspecified site of unspecified female breast: Secondary | ICD-10-CM | POA: Insufficient documentation

## 2011-07-25 MED ORDER — TECHNETIUM TC 99M MEDRONATE IV KIT
25.3000 | PACK | Freq: Once | INTRAVENOUS | Status: AC | PRN
  Administered 2011-07-25: 25.3 via INTRAVENOUS

## 2011-08-08 DIAGNOSIS — IMO0002 Reserved for concepts with insufficient information to code with codable children: Secondary | ICD-10-CM | POA: Diagnosis not present

## 2011-12-05 DIAGNOSIS — Z9861 Coronary angioplasty status: Secondary | ICD-10-CM | POA: Diagnosis not present

## 2011-12-05 DIAGNOSIS — I379 Nonrheumatic pulmonary valve disorder, unspecified: Secondary | ICD-10-CM | POA: Diagnosis not present

## 2011-12-05 DIAGNOSIS — R0789 Other chest pain: Secondary | ICD-10-CM | POA: Diagnosis not present

## 2011-12-17 DIAGNOSIS — Z853 Personal history of malignant neoplasm of breast: Secondary | ICD-10-CM | POA: Diagnosis not present

## 2012-02-04 DIAGNOSIS — J029 Acute pharyngitis, unspecified: Secondary | ICD-10-CM | POA: Diagnosis not present

## 2012-04-15 ENCOUNTER — Other Ambulatory Visit (HOSPITAL_BASED_OUTPATIENT_CLINIC_OR_DEPARTMENT_OTHER): Payer: Medicare Other | Admitting: Lab

## 2012-04-15 DIAGNOSIS — C50919 Malignant neoplasm of unspecified site of unspecified female breast: Secondary | ICD-10-CM | POA: Diagnosis not present

## 2012-04-15 DIAGNOSIS — C50419 Malignant neoplasm of upper-outer quadrant of unspecified female breast: Secondary | ICD-10-CM | POA: Diagnosis not present

## 2012-04-15 LAB — COMPREHENSIVE METABOLIC PANEL (CC13)
Alkaline Phosphatase: 73 U/L (ref 40–150)
Glucose: 96 mg/dl (ref 70–99)
Sodium: 140 mEq/L (ref 136–145)
Total Bilirubin: 0.56 mg/dL (ref 0.20–1.20)
Total Protein: 6.7 g/dL (ref 6.4–8.3)

## 2012-04-15 LAB — CBC WITH DIFFERENTIAL/PLATELET
Basophils Absolute: 0 10*3/uL (ref 0.0–0.1)
EOS%: 1.9 % (ref 0.0–7.0)
MCH: 30.8 pg (ref 25.1–34.0)
MCHC: 33.5 g/dL (ref 31.5–36.0)
MCV: 92 fL (ref 79.5–101.0)
MONO%: 6.8 % (ref 0.0–14.0)
RBC: 4.77 10*6/uL (ref 3.70–5.45)
RDW: 13 % (ref 11.2–14.5)

## 2012-04-15 LAB — CANCER ANTIGEN 27.29: CA 27.29: 17 U/mL (ref 0–39)

## 2012-04-22 ENCOUNTER — Telehealth: Payer: Self-pay | Admitting: *Deleted

## 2012-04-22 ENCOUNTER — Ambulatory Visit (HOSPITAL_BASED_OUTPATIENT_CLINIC_OR_DEPARTMENT_OTHER): Payer: Medicare Other | Admitting: Oncology

## 2012-04-22 VITALS — BP 138/72 | HR 68 | Temp 97.9°F | Resp 20 | Ht 64.0 in | Wt 142.5 lb

## 2012-04-22 DIAGNOSIS — Z853 Personal history of malignant neoplasm of breast: Secondary | ICD-10-CM | POA: Diagnosis not present

## 2012-04-22 DIAGNOSIS — C50919 Malignant neoplasm of unspecified site of unspecified female breast: Secondary | ICD-10-CM

## 2012-04-22 NOTE — Telephone Encounter (Signed)
appts made printed 

## 2012-04-22 NOTE — Progress Notes (Signed)
ID: Grace Fletcher   DOB: [birth year]  MR#: [id number]  WUJ#:[id number]  PCP: Reather Littler GYN: Marcelle Overlie SU: OTHER MD: Verdis Prime  HISTORY OF PRESENT ILLNESS: The patient moved to this area from [place] about 2004 and neglected her health maintenance.  She eventually had problems with the right carpal tunnel and was evaluated by Dr. Shirlee Latch for this.  Dr. Metro Kung went beyond the immediate issue and set the patient up for a mammogram at [facility] on 11/21/2004.  This showed a suspicious mass in the left upper outer quadrant.  The next day, the patient was brought back for further evaluation and had breast specific gamma imaging, which showed in the extreme upper outer quadrant of the left breast 1.8 cm focus of intense activity.  There was no axillary activity noted.  An ultrasound confirmed the presence of the mass and this was biopsied the same day 11/22/2004.  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  With this information, the patient was referred to Dr. Derrell Lolling and after appropriate discussion, he proceeded to left lumpectomy with a sentinel lymph node sampling on 12/09/2004.  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  INTERVAL HISTORY: [name] returns for followup of her breast cancer. The interval history is generally unremarkable. She continues to exercise 3-4 times a week at the [employer] and does some gardening in addition.  REVIEW OF SYSTEMS: She has some sleep disturbance, which makes her mildly fatigued. She has a history of palpitations, which have been thoroughly evaluated by cardiology. She is occasionally short of  breath, but this is very inconstant, not associated with palpitations, and when she exercises she is not more short of breath than anyone else. There is no chest pain or pressure. She does have back pain and occasional arthritis and joint pains, which are inconstant and not more frequent or intense than prior. She continues to have hot flashes. A detailed review of systems today was otherwise noncontributory.  PAST MEDICAL HISTORY: Past Medical History  Diagnosis Date  . Breast cancer   . Cyst of pancreas   . Pulmonary hypertension 05/13/2010  Significant for remote history of migraines, history of patent foramen ovale repair approximately 2002 after what may have been small strokes with no residual, history of palpitation secondary to the "patch" which is now much better controlled on Toprol, history of carpal tunnel repair, history of hemorrhoidectomy, history of colon polyp removal approximately 15 years ago, history of osteopenia, and history of mild osteoarthritis.  PAST SURGICAL HISTORY: Past Surgical History  Procedure Laterality Date  . Breast lumpectomy      left  . Sentinel lymph node biopsy      FAMILY HISTORY The patient's father died at the age of 19 from a myocardial infarction.  The patient's mother died at the age of nearly 53.  The patient has 1 sister, others no history of breast or ovarian cancer in the family.  GYNECOLOGIC HISTORY: She is  GX P4,  change of life  at age 26.  She took Prempro until she had her heart surgery approximately 2002  SOCIAL HISTORY: The  patient worked as a Diplomatic Services operational officer, but is now retired, as is her husband Grace Fletcher (goes by [employer]")   He was a Environmental education officer.  They have 4 children.  Daughter Grace Fletcher in [place] works in Chief Financial Officer, daughter Research scientist (physical sciences) in [place] works in administration, son Grace Fletcher in [place] is a Proofreader with the [employer] and son Grace Fletcher in  [place], [place] is a Comptroller.  The patient has 1 grandchild and 1 great  grandchild.  She attends the [employer].   ADVANCED DIRECTIVES:  HEALTH MAINTENANCE: History  Substance Use Topics  . Smoking status: Former Games developer  . Smokeless tobacco: Not on file  . Alcohol Use: Not on file     Colonoscopy:  PAP:  Bone density:  Lipid panel:  No Known Allergies  Current Outpatient Prescriptions  Medication Sig Dispense Refill  . aspirin 81 MG tablet Take 81 mg by mouth daily.        . citalopram (CELEXA) 40 MG tablet Take 40 mg by mouth daily.        Marland Kitchen glucosamine-chondroitin 500-400 MG tablet Take 1 tablet by mouth 3 (three) times daily.      . Multiple Vitamin (MULTIVITAMIN) capsule Take 1 capsule by mouth daily.         No current facility-administered medications for this visit.    OBJECTIVE: Middle-aged white woman in no acute distress Filed Vitals:   04/22/12 1132  BP: 138/72  Pulse: 68  Temp: 97.9 F (36.6 C)  Resp: 20     Body mass index is 24.45 kg/(m^2).    ECOG FS: 0  Sclerae unicteric Oropharynx clear No cervical or supraclavicular adenopathy Lungs no rales or rhonchi Heart regular rate and rhythm Abd soft, nontender positive bowel sounds MSK no focal spinal tenderness, no peripheral edema Neuro: nonfocal, well oriented, pleasant affect. Breasts: The right breast is unremarkable; the left breast is status post lumpectomy; there is no evidence of local recurrence; the left axilla is benign  LAB RESULTS: Lab Results  Component Value Date   WBC 4.7 04/15/2012   NEUTROABS 2.6 04/15/2012   HGB 14.7 04/15/2012   HCT 43.9 04/15/2012   MCV 92.0 04/15/2012   PLT 176 04/15/2012      Chemistry      Component Value Date/Time   NA 140 04/15/2012 1210   NA 140 04/16/2011 1311   K 3.9 04/15/2012 1210   K 4.2 04/16/2011 1311   CL 106 04/15/2012 1210   CL 107 04/16/2011 1311   CO2 24 04/15/2012 1210   CO2 21 04/16/2011 1311   BUN 9.8 04/15/2012 1210   BUN 14 04/16/2011 1311   CREATININE 0.7 04/15/2012 1210   CREATININE 0.64  04/16/2011 1311      Component Value Date/Time   CALCIUM 9.4 04/15/2012 1210   CALCIUM 9.3 04/16/2011 1311   ALKPHOS 73 04/15/2012 1210   ALKPHOS 63 04/16/2011 1311   AST 21 04/15/2012 1210   AST 21 04/16/2011 1311   ALT 12 04/15/2012 1210   ALT 9 04/16/2011 1311   BILITOT 0.56 04/15/2012 1210   BILITOT 0.7 04/16/2011 1311       Lab Results  Component Value Date   LABCA2 17 04/15/2012    No components found with this basename: [id number]    No results found for this basename: INR,  in the last 168 hours  Urinalysis No results found for this basename: colorurine,  appearanceur,  labspec,  phurine,  glucoseu,  hgbur,  bilirubinur,  ketonesur,  proteinur,  urobilinogen,  nitrite,  leukocytesur    STUDIES: Mammography at [facility] 12/17/2011 was unremarkable  ASSESSMENT: 73 y.o.  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   (2) completed adjuvant radiation 03/27/2005  (3) on tamoxifen April of 2007 to April of 2012. The patient decided against aromatase inhibitors at that time.  PLAN: Jashawna is doing well, with no evidence of recurrence, and an overall good prognosis. We are going to see her twice more, on a yearly basis, and at her 10 year mark we will discharge her to her primary care physician. She is considering establishing herself with Dr. Marinda Elk. She knows to call for any problems that may develop before the next visit.   Kiaja Shorty C    04/22/2012

## 2012-05-20 ENCOUNTER — Other Ambulatory Visit: Payer: Self-pay | Admitting: Obstetrics and Gynecology

## 2012-05-20 DIAGNOSIS — N950 Postmenopausal bleeding: Secondary | ICD-10-CM | POA: Diagnosis not present

## 2012-05-20 DIAGNOSIS — Z124 Encounter for screening for malignant neoplasm of cervix: Secondary | ICD-10-CM | POA: Diagnosis not present

## 2012-05-20 DIAGNOSIS — Z01419 Encounter for gynecological examination (general) (routine) without abnormal findings: Secondary | ICD-10-CM | POA: Diagnosis not present

## 2012-09-09 DIAGNOSIS — R141 Gas pain: Secondary | ICD-10-CM | POA: Diagnosis not present

## 2012-09-09 DIAGNOSIS — R143 Flatulence: Secondary | ICD-10-CM | POA: Diagnosis not present

## 2012-09-09 DIAGNOSIS — K5900 Constipation, unspecified: Secondary | ICD-10-CM | POA: Diagnosis not present

## 2012-09-09 DIAGNOSIS — R195 Other fecal abnormalities: Secondary | ICD-10-CM | POA: Diagnosis not present

## 2012-09-09 DIAGNOSIS — Z1211 Encounter for screening for malignant neoplasm of colon: Secondary | ICD-10-CM | POA: Diagnosis not present

## 2012-09-09 DIAGNOSIS — R1013 Epigastric pain: Secondary | ICD-10-CM | POA: Diagnosis not present

## 2012-09-24 ENCOUNTER — Telehealth: Payer: Self-pay | Admitting: *Deleted

## 2012-09-24 NOTE — Telephone Encounter (Signed)
Message left by pt requesting a return call to obtain a " pill for my nerves that Amy had given me years ago when I was getting treatment ". " I have a situation with my son in [place] that is very overwhelming and I need to get something for my nerves ".  This RN returned call to pt to inform her Amy nor Dr Thea Silversmith is in the office at this time to review for dispense of above medication. Per conversation Hadasa was able to state concern with son is relating to a legal issue.  Per medication review Hiawatha states she is no longer taking the celexa, " and Amy had at one time given me xanax for my nerves ". " I have been doing so good for this last year ".  Pt has not established herself with a primary MD ( per note she was going to be seeing Dr Foy Guadalajara ).  Per phone conversation this RN gave pt verbal support per conversation of how her and her husband will be assisting with son's legal situation. This RN also informed her concern would be left with MD for review upon his return to the office.

## 2012-09-28 ENCOUNTER — Other Ambulatory Visit: Payer: Self-pay | Admitting: *Deleted

## 2012-09-28 MED ORDER — CITALOPRAM HYDROBROMIDE 40 MG PO TABS: 40.0000 mg | ORAL_TABLET | Freq: Every day | ORAL | Status: DC

## 2012-09-28 MED ORDER — LORAZEPAM 0.5 MG PO TABS: 0.5000 mg | ORAL_TABLET | Freq: Three times a day (TID) | ORAL | Status: DC

## 2012-09-28 MED ORDER — CITALOPRAM HYDROBROMIDE 20 MG PO TABS: 20.0000 mg | ORAL_TABLET | Freq: Every day | ORAL | Status: DC

## 2012-09-28 NOTE — Telephone Encounter (Signed)
Per MD review of pt's call on Friday requesting medications previously prescribed by this offce - this RN obtained prescriptions for celexa and ativan.  Called and discussed with pt as well as called to pharmacy.

## 2012-10-21 DIAGNOSIS — Z23 Encounter for immunization: Secondary | ICD-10-CM | POA: Diagnosis not present

## 2012-11-08 ENCOUNTER — Encounter: Payer: Self-pay | Admitting: *Deleted

## 2012-11-08 ENCOUNTER — Encounter: Payer: Self-pay | Admitting: Interventional Cardiology

## 2012-11-11 ENCOUNTER — Ambulatory Visit (HOSPITAL_COMMUNITY): Payer: Medicare Other | Attending: Cardiology | Admitting: Radiology

## 2012-11-11 ENCOUNTER — Encounter: Payer: Self-pay | Admitting: Interventional Cardiology

## 2012-11-11 ENCOUNTER — Ambulatory Visit (INDEPENDENT_AMBULATORY_CARE_PROVIDER_SITE_OTHER): Payer: Medicare Other | Admitting: Interventional Cardiology

## 2012-11-11 ENCOUNTER — Other Ambulatory Visit (HOSPITAL_COMMUNITY): Payer: Self-pay | Admitting: Interventional Cardiology

## 2012-11-11 VITALS — BP 108/80 | HR 62 | Ht 64.0 in | Wt 136.0 lb

## 2012-11-11 DIAGNOSIS — Z853 Personal history of malignant neoplasm of breast: Secondary | ICD-10-CM | POA: Diagnosis not present

## 2012-11-11 DIAGNOSIS — Q2111 Secundum atrial septal defect: Secondary | ICD-10-CM | POA: Insufficient documentation

## 2012-11-11 DIAGNOSIS — I071 Rheumatic tricuspid insufficiency: Secondary | ICD-10-CM

## 2012-11-11 DIAGNOSIS — I079 Rheumatic tricuspid valve disease, unspecified: Secondary | ICD-10-CM

## 2012-11-11 DIAGNOSIS — Q211 Atrial septal defect: Secondary | ICD-10-CM | POA: Insufficient documentation

## 2012-11-11 DIAGNOSIS — R0789 Other chest pain: Secondary | ICD-10-CM

## 2012-11-11 DIAGNOSIS — I370 Nonrheumatic pulmonary valve stenosis: Secondary | ICD-10-CM

## 2012-11-11 DIAGNOSIS — I059 Rheumatic mitral valve disease, unspecified: Secondary | ICD-10-CM | POA: Insufficient documentation

## 2012-11-11 DIAGNOSIS — I379 Nonrheumatic pulmonary valve disorder, unspecified: Secondary | ICD-10-CM | POA: Insufficient documentation

## 2012-11-11 DIAGNOSIS — C50919 Malignant neoplasm of unspecified site of unspecified female breast: Secondary | ICD-10-CM

## 2012-11-11 DIAGNOSIS — Z9889 Other specified postprocedural states: Secondary | ICD-10-CM | POA: Diagnosis not present

## 2012-11-11 DIAGNOSIS — Z8774 Personal history of (corrected) congenital malformations of heart and circulatory system: Secondary | ICD-10-CM

## 2012-11-11 NOTE — Progress Notes (Signed)
Echocardiogram performed.  

## 2012-11-11 NOTE — Patient Instructions (Signed)
Your physician recommends that you continue on your current medications as directed. Please refer to the Current Medication list given to you today.  Your physician recommends that you schedule a follow-up appointment in: 1 year with Dr. Smith.   

## 2012-11-11 NOTE — Progress Notes (Signed)
[name], female   DOB: 04/21/1940, 72 y.o.   MRN: [id number]    HPI Overall, Grace [place] is doing well from cardiac standpoint. She denies chills, fever, ankle swelling, dyspnea on exertion, and syncope. She still has occasional chest tightness. This is been previously evaluated without findings suggestive of CAD. No medication side effects. She is not exercising. No Known Allergies  Current Outpatient Prescriptions  Medication Sig Dispense Refill  . aspirin 81 MG tablet Take 81 mg by mouth daily.        . Cholecalciferol (VITAMIN D) 1000 UNITS capsule Take 1,000 Units by mouth daily.      . citalopram (CELEXA) 40 MG tablet Take 1 tablet (40 mg total) by mouth daily.  30 tablet  6  . Docusate Calcium (STOOL SOFTENER PO) Take by mouth daily.      Marland Kitchen LORazepam (ATIVAN) 0.5 MG tablet Take 1 tablet (0.5 mg total) by mouth every 8 (eight) hours.  10 tablet  1  . Multiple Vitamin (MULTIVITAMIN) capsule Take 1 capsule by mouth daily.         No current facility-administered medications for this visit.    Past Medical History  Diagnosis Date  . Breast cancer   . Cyst of pancreas   . Pulmonary hypertension 05/13/2010  . Chest discomfort   . Pulmonary stenosis     Past Surgical History  Procedure Laterality Date  . Breast lumpectomy      left  . Sentinel lymph node biopsy    . Cesarean section    . Carpal tunnel release      ROS: Tearful. Feels depressed because of her sons troubles. No cardiac complaints. No prolonged palpitations. Denies syncope. No ankle edema. Denies chills, and fever.  PHYSICAL EXAM BP 108/80  Pulse 62  Ht 5\' 4"  (1.626 m)  Wt 136 lb (61.689 kg)  BMI 23.33 kg/m2  Neck exam reveals no JVD or carotid bruits. Chest is clear to consultation and percussion. Cardiac exam reveals a soft 1-2 of 6 systolic murmur along the left mid sternal border. Abdomen is soft. Liver is nonpalpable. No peripheral edema is noted. Neurological exam is  normal. Patient appears depressed.  EKG: Normal sinus rhythm with leftward axis and a relatively short PR interval  ASSESSMENT AND PLAN  1. Congenital pulmonic stenosis.  2. ASD, status post closure with CardioSEAL device, [facility]  3. History of exertional chest pain, with no prior workup, suggesting the presence of coronary disease.  4. Moderate tricuspid regurgitation by echocardiography when last performed. Clinically silent.  5. History of breast cancer   In summary, Grace [place], stable. Her congenital heart disease, status is stable. There is no significant murmur of pulmonic stenosis noted. Even though the echo suggests the presence of significant tricuspid regurgitation, there is no clinical evidence to support. I have encouraged her to increase her aerobic activity and return to see me in one year per

## 2012-12-22 DIAGNOSIS — Z1231 Encounter for screening mammogram for malignant neoplasm of breast: Secondary | ICD-10-CM | POA: Diagnosis not present

## 2013-01-10 ENCOUNTER — Encounter: Payer: Self-pay | Admitting: Oncology

## 2013-02-07 DIAGNOSIS — D126 Benign neoplasm of colon, unspecified: Secondary | ICD-10-CM | POA: Diagnosis not present

## 2013-02-07 DIAGNOSIS — R195 Other fecal abnormalities: Secondary | ICD-10-CM | POA: Diagnosis not present

## 2013-02-07 DIAGNOSIS — Z1211 Encounter for screening for malignant neoplasm of colon: Secondary | ICD-10-CM | POA: Diagnosis not present

## 2013-02-07 DIAGNOSIS — H04129 Dry eye syndrome of unspecified lacrimal gland: Secondary | ICD-10-CM | POA: Diagnosis not present

## 2013-02-07 DIAGNOSIS — K5900 Constipation, unspecified: Secondary | ICD-10-CM | POA: Diagnosis not present

## 2013-02-07 DIAGNOSIS — H2510 Age-related nuclear cataract, unspecified eye: Secondary | ICD-10-CM | POA: Diagnosis not present

## 2013-02-22 DIAGNOSIS — K648 Other hemorrhoids: Secondary | ICD-10-CM | POA: Diagnosis not present

## 2013-02-22 DIAGNOSIS — K5900 Constipation, unspecified: Secondary | ICD-10-CM | POA: Diagnosis not present

## 2013-04-18 ENCOUNTER — Other Ambulatory Visit (HOSPITAL_BASED_OUTPATIENT_CLINIC_OR_DEPARTMENT_OTHER): Payer: Medicare Other

## 2013-04-18 DIAGNOSIS — C50919 Malignant neoplasm of unspecified site of unspecified female breast: Secondary | ICD-10-CM

## 2013-04-18 DIAGNOSIS — Z853 Personal history of malignant neoplasm of breast: Secondary | ICD-10-CM | POA: Diagnosis not present

## 2013-04-18 LAB — CBC WITH DIFFERENTIAL/PLATELET
BASO%: 0.9 % (ref 0.0–2.0)
Basophils Absolute: 0 10*3/uL (ref 0.0–0.1)
EOS%: 1.6 % (ref 0.0–7.0)
Eosinophils Absolute: 0.1 10*3/uL (ref 0.0–0.5)
HEMATOCRIT: 44.4 % (ref 34.8–46.6)
HEMOGLOBIN: 14.9 g/dL (ref 11.6–15.9)
LYMPH#: 1.9 10*3/uL (ref 0.9–3.3)
LYMPH%: 36 % (ref 14.0–49.7)
MCH: 31.6 pg (ref 25.1–34.0)
MCHC: 33.5 g/dL (ref 31.5–36.0)
MCV: 94.3 fL (ref 79.5–101.0)
MONO#: 0.5 10*3/uL (ref 0.1–0.9)
MONO%: 8.7 % (ref 0.0–14.0)
NEUT#: 2.8 10*3/uL (ref 1.5–6.5)
NEUT%: 52.8 % (ref 38.4–76.8)
Platelets: 187 10*3/uL (ref 145–400)
RBC: 4.71 10*6/uL (ref 3.70–5.45)
RDW: 12.9 % (ref 11.2–14.5)
WBC: 5.3 10*3/uL (ref 3.9–10.3)

## 2013-04-18 LAB — COMPREHENSIVE METABOLIC PANEL (CC13)
ALBUMIN: 3.8 g/dL (ref 3.5–5.0)
ALT: 13 U/L (ref 0–55)
ANION GAP: 10 meq/L (ref 3–11)
AST: 17 U/L (ref 5–34)
Alkaline Phosphatase: 69 U/L (ref 40–150)
BUN: 12.2 mg/dL (ref 7.0–26.0)
CALCIUM: 9.8 mg/dL (ref 8.4–10.4)
CHLORIDE: 106 meq/L (ref 98–109)
CO2: 26 meq/L (ref 22–29)
Creatinine: 0.7 mg/dL (ref 0.6–1.1)
Glucose: 84 mg/dl (ref 70–140)
POTASSIUM: 4.2 meq/L (ref 3.5–5.1)
Sodium: 143 mEq/L (ref 136–145)
TOTAL PROTEIN: 6.9 g/dL (ref 6.4–8.3)
Total Bilirubin: 0.51 mg/dL (ref 0.20–1.20)

## 2013-04-25 ENCOUNTER — Encounter: Payer: Medicare Other | Admitting: Physician Assistant

## 2013-04-25 ENCOUNTER — Other Ambulatory Visit: Payer: Self-pay | Admitting: Physician Assistant

## 2013-04-25 ENCOUNTER — Telehealth: Payer: Self-pay | Admitting: Physician Assistant

## 2013-04-25 DIAGNOSIS — F32A Depression, unspecified: Secondary | ICD-10-CM

## 2013-04-25 DIAGNOSIS — F329 Major depressive disorder, single episode, unspecified: Secondary | ICD-10-CM

## 2013-04-25 MED ORDER — CITALOPRAM HYDROBROMIDE 40 MG PO TABS: 40.0000 mg | ORAL_TABLET | Freq: Every day | ORAL | Status: DC

## 2013-04-25 NOTE — Telephone Encounter (Signed)
, °

## 2013-04-25 NOTE — Progress Notes (Signed)
Pt agreed to reschedule her follow-up appt with Micah Flesher, PA-C on 05/26/2013. Pt has been having experiencing some sadness due to family circumstances, otherwise is feeling fine. Talked with pt and encouraged her to see a primary care physician and perhaps counseling. Pt agreed and will see [place] in office on 05/26/2013 @1 :45p. I will call to check on pt tomorrow. Message to be forwarded to [employer], PA-C.

## 2013-05-24 DIAGNOSIS — Z01419 Encounter for gynecological examination (general) (routine) without abnormal findings: Secondary | ICD-10-CM | POA: Diagnosis not present

## 2013-05-26 ENCOUNTER — Encounter: Payer: Self-pay | Admitting: Physician Assistant

## 2013-05-26 ENCOUNTER — Ambulatory Visit (HOSPITAL_BASED_OUTPATIENT_CLINIC_OR_DEPARTMENT_OTHER): Payer: Medicare Other | Admitting: Physician Assistant

## 2013-05-26 ENCOUNTER — Telehealth: Payer: Self-pay | Admitting: Physician Assistant

## 2013-05-26 VITALS — BP 128/81 | HR 63 | Temp 98.1°F | Resp 18 | Ht 64.0 in | Wt 143.2 lb

## 2013-05-26 DIAGNOSIS — F32A Depression, unspecified: Secondary | ICD-10-CM | POA: Insufficient documentation

## 2013-05-26 DIAGNOSIS — F419 Anxiety disorder, unspecified: Secondary | ICD-10-CM

## 2013-05-26 DIAGNOSIS — Z853 Personal history of malignant neoplasm of breast: Secondary | ICD-10-CM

## 2013-05-26 DIAGNOSIS — F341 Dysthymic disorder: Secondary | ICD-10-CM

## 2013-05-26 DIAGNOSIS — F329 Major depressive disorder, single episode, unspecified: Secondary | ICD-10-CM

## 2013-05-26 DIAGNOSIS — M2550 Pain in unspecified joint: Secondary | ICD-10-CM | POA: Diagnosis not present

## 2013-05-26 DIAGNOSIS — Z1231 Encounter for screening mammogram for malignant neoplasm of breast: Secondary | ICD-10-CM

## 2013-05-26 DIAGNOSIS — C50919 Malignant neoplasm of unspecified site of unspecified female breast: Secondary | ICD-10-CM

## 2013-05-26 HISTORY — DX: Anxiety disorder, unspecified: F41.9

## 2013-05-26 HISTORY — DX: Depression, unspecified: F32.A

## 2013-05-26 NOTE — Progress Notes (Signed)
ID: Grace Fletcher   DOB: 02-14-1940  MR#: [id number]  UDJ#:[id number]  PCP: GYN: Dian Queen, MD HY:IFOYDXA Dalbert Batman, MD OTHER MD: Daneen Schick, MD  CHIEF COMPLAINT:  Hx of Left Breast Cancer    HISTORY OF PRESENT ILLNESS: The patient moved to this area from [place] about 2004 and neglected her health maintenance.  She eventually had problems with the right carpal tunnel and was evaluated by Dr. Jackey Loge for this.  Dr. Almira Bar went beyond the immediate issue and set the patient up for a mammogram at [facility] on 11/21/2004.  This showed a suspicious mass in the left upper outer quadrant.  The next day, the patient was brought back for further evaluation and had breast specific gamma imaging, which showed in the extreme upper outer quadrant of the left breast 1.8 cm focus of intense activity.  There was no axillary activity noted.  An ultrasound confirmed the presence of the mass and this was biopsied the same day 11/22/2004.  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  With this information, the patient was referred to Dr. Dalbert Batman and after appropriate discussion, he proceeded to left lumpectomy with a sentinel lymph node sampling on 12/09/2004.  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  Subsequent history is as detailed below.  INTERVAL HISTORY: Grace [place] returns alone today for followup of her left breast cancer. Physically, she is doing well.  Emotionally, however, she is having a lot of anxiety and depression. There are some family situations going on involving her son who lives in [place], and this is causing her an extreme amount  of stress. She recently started on Celexa, 40 mg daily, in addition to Wellbutrin, 300 mg daily. She uses lorazepam only sparingly if needed for anxiety.  She denies suicidal ideation.   REVIEW OF SYSTEMS: Grace [place] denies any recent illnesses and has had no fevers, chills, night sweats, or hot flashes. She has some problems sleeping, usually secondary to anxiety, and this causes mild fatigue. She's had no rashes or skin changes. She denies any abnormal bruising or bleeding. Her appetite is fair. She's had no nausea, emesis, or change in bowel or bladder habits. She denies any cough, phlegm production, pleurisy, or shortness of breath. She does have a history of palpitations, and this is followed regularly by Dr. Tamala Julian. She denies any abnormal headaches or dizziness. She does have some joint pain associated with arthritis, but denies any new myalgias, arthralgias, or bony pain. She's had no peripheral swelling.  A detailed review of systems is otherwise stable and noncontributory.   PAST MEDICAL HISTORY: Past Medical History  Diagnosis Date  . Breast cancer   . Cyst of pancreas   . Pulmonary hypertension 05/13/2010  . Chest discomfort   . Pulmonary stenosis   Significant for remote history of migraines, history of patent foramen ovale repair approximately 2002 after what may have been small strokes with no residual, history of palpitation secondary to the "patch" which is now much better controlled on Toprol, history of carpal tunnel repair, history of hemorrhoidectomy, history of colon polyp removal approximately 15 years ago, history of osteopenia,  and history of mild osteoarthritis.  PAST SURGICAL HISTORY: Past Surgical History  Procedure Laterality Date  . Breast lumpectomy      left  . Sentinel lymph node biopsy    . Cesarean section    . Carpal tunnel release      FAMILY HISTORY The patient's father died at the age of 88 from a myocardial infarction.  The patient's mother died at the  age of nearly 24.  The patient has 1 sister, others no history of breast or ovarian cancer in the family.  GYNECOLOGIC HISTORY:  (Reviewed 05/26/2013) She is  GX P4,  change of life  at age 50.  She took Prempro until she had her heart surgery approximately 2002  SOCIAL HISTORY:  (Updated on 05/26/2013) The patient worked as a Network engineer, but is now retired, as is her husband Grace Fletcher (goes by Grace Fletcher")   He was a Lexicographer.  They have 4 children.  Daughter Grace Fletcher in [place] works in Pharmacologist; daughter Grace [place] in [place] works in administration; son Grace [place] in [place] was a Hydrologist with the [employer] but is currently incarcerated in [place]; and son Grace [place] in  [place], [place] is a Land.  The patient has 1 grandchild and 1 great grandchild.  She attends the [employer].   ADVANCED DIRECTIVES:  HEALTH MAINTENANCE:  (Updated 05/26/2013) History  Substance Use Topics  . Smoking status: Former Research scientist (life sciences)  . Smokeless tobacco: Never Used  . Alcohol Use: No     Colonoscopy:  Not on file  PAP: April 2014/[facility]  Bone density: Not on file  Lipid panel:  UTD/[facility]  No Known Allergies  Current Outpatient Prescriptions  Medication Sig Dispense Refill  . aspirin 81 MG tablet Take 81 mg by mouth daily.        Marland Kitchen buPROPion (WELLBUTRIN SR) 150 MG 12 hr tablet       . Cholecalciferol (VITAMIN D) 1000 UNITS capsule Take 1,000 Units by mouth daily.      . citalopram (CELEXA) 40 MG tablet Take 1 tablet (40 mg total) by mouth daily.  30 tablet  3  . Docusate Calcium (STOOL SOFTENER PO) Take by mouth daily.      . Multiple Vitamin (MULTIVITAMIN) capsule Take 1 capsule by mouth daily.        Marland Kitchen LORazepam (ATIVAN) 0.5 MG tablet Take 1 tablet (0.5 mg total) by mouth every 8 (eight) hours.  10 tablet  1   No current facility-administered medications for this visit.    OBJECTIVE: Middle-aged white woman who appears anxious but is in no acute distress Filed Vitals:    05/26/13 1333  BP: 128/81  Pulse: 63  Temp: 98.1 F (36.7 C)  Resp: 18     Body mass index is 24.57 kg/(m^2).    ECOG FS: 0 Filed Weights   05/26/13 1333  Weight: 143 lb 3.2 oz (64.955 kg)   Physical Exam: HEENT:  Sclerae anicteric.  Oropharynx clear and moist. Trachea midline. Neck supple. No thyromegaly.  NODES:  No cervical or supraclavicular lymphadenopathy palpated.  BREAST EXAM:  Bilateral implants are intact. Right breast is unremarkable. Left breast is status post lumpectomy.  There no suspicious nodularities, abnormalities, skin changes, or evidence of local recurrence. Axillae are benign bilaterally palpable lymphadenopathy. LUNGS:  Clear to auscultation bilaterally with good excursion.  No wheezes or rhonchi HEART:  Regular rate and rhythm. ABDOMEN:  Soft, nontender. No organomegaly or masses palpated. Positive bowel sounds.  MSK:  No focal spinal tenderness to palpation. Good range of motion bilaterally in the upper extremities. EXTREMITIES:  No peripheral edema.  No lymphedema in the left upper extremity. SKIN:  No visible rashes. No excessive ecchymoses. No petechiae. No pallor. Good skin turgor. NEURO:  Nonfocal. Well oriented.  Depressed affect.   LAB RESULTS: Lab Results  Component Value Date   WBC 5.3 04/18/2013   NEUTROABS 2.8 04/18/2013   HGB 14.9 04/18/2013   HCT 44.4 04/18/2013   MCV 94.3 04/18/2013   PLT 187 04/18/2013      Chemistry      Component Value Date/Time   NA 143 04/18/2013 1119   NA 140 04/16/2011 1311   K 4.2 04/18/2013 1119   K 4.2 04/16/2011 1311   CL 106 04/15/2012 1210   CL 107 04/16/2011 1311   CO2 26 04/18/2013 1119   CO2 21 04/16/2011 1311   BUN 12.2 04/18/2013 1119   BUN 14 04/16/2011 1311   CREATININE 0.7 04/18/2013 1119   CREATININE 0.64 04/16/2011 1311      Component Value Date/Time   CALCIUM 9.8 04/18/2013 1119   CALCIUM 9.3 04/16/2011 1311   ALKPHOS 69 04/18/2013 1119   ALKPHOS 63 04/16/2011 1311   AST 17 04/18/2013 1119   AST 21  04/16/2011 1311   ALT 13 04/18/2013 1119   ALT 9 04/16/2011 1311   BILITOT 0.51 04/18/2013 1119   BILITOT 0.7 04/16/2011 1311       STUDIES:  Bilateral mammogram at [facility] on 12/22/2012 was unremarkable.   ASSESSMENT: 73 y.o.  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   (3) completed adjuvant radiation 03/27/2005  (4) on tamoxifen April of 2007 to April of 2012. The patient decided against aromatase inhibitors at that time.  PLAN:  With regards to her breast cancer, Levonne is doing well, and there is no clinical evidence of disease recurrence. The plan is to see her one more time in April of 2016 which will be her 10 year mark. We will likely allow her to "graduate" from followup at that time. In the meanwhile, she'll continue to be followed by Dr. Helane Rima, and we have also encouraged her to find a primary care physician as well.  Her next mammogram is due in November.  All of the above was reviewed in detail with [facility] today. She voices understanding and agreement with our plan, and will call with any changes or problems.  Niurka Benecke Milda Smart PA-C    05/26/2013

## 2013-05-26 NOTE — Telephone Encounter (Signed)
, °

## 2013-06-01 DIAGNOSIS — M949 Disorder of cartilage, unspecified: Secondary | ICD-10-CM | POA: Diagnosis not present

## 2013-06-01 DIAGNOSIS — Z79899 Other long term (current) drug therapy: Secondary | ICD-10-CM | POA: Diagnosis not present

## 2013-06-01 DIAGNOSIS — M899 Disorder of bone, unspecified: Secondary | ICD-10-CM | POA: Diagnosis not present

## 2013-06-01 DIAGNOSIS — F064 Anxiety disorder due to known physiological condition: Secondary | ICD-10-CM | POA: Diagnosis not present

## 2013-06-01 DIAGNOSIS — N950 Postmenopausal bleeding: Secondary | ICD-10-CM | POA: Diagnosis not present

## 2013-09-05 ENCOUNTER — Other Ambulatory Visit: Payer: Self-pay | Admitting: *Deleted

## 2013-09-05 DIAGNOSIS — F32A Depression, unspecified: Secondary | ICD-10-CM

## 2013-09-05 DIAGNOSIS — F329 Major depressive disorder, single episode, unspecified: Secondary | ICD-10-CM

## 2013-09-05 MED ORDER — CITALOPRAM HYDROBROMIDE 40 MG PO TABS: 40.0000 mg | ORAL_TABLET | Freq: Every day | ORAL | Status: DC

## 2013-10-01 DIAGNOSIS — J029 Acute pharyngitis, unspecified: Secondary | ICD-10-CM | POA: Diagnosis not present

## 2013-10-01 DIAGNOSIS — J00 Acute nasopharyngitis [common cold]: Secondary | ICD-10-CM | POA: Diagnosis not present

## 2013-12-15 ENCOUNTER — Ambulatory Visit (INDEPENDENT_AMBULATORY_CARE_PROVIDER_SITE_OTHER): Payer: Medicare Other | Admitting: Interventional Cardiology

## 2013-12-15 ENCOUNTER — Encounter: Payer: Self-pay | Admitting: Interventional Cardiology

## 2013-12-15 VITALS — BP 122/74 | HR 63 | Ht 64.0 in | Wt 148.0 lb

## 2013-12-15 DIAGNOSIS — Z9889 Other specified postprocedural states: Secondary | ICD-10-CM

## 2013-12-15 DIAGNOSIS — I270 Primary pulmonary hypertension: Secondary | ICD-10-CM | POA: Diagnosis not present

## 2013-12-15 DIAGNOSIS — Z8774 Personal history of (corrected) congenital malformations of heart and circulatory system: Secondary | ICD-10-CM

## 2013-12-15 DIAGNOSIS — I272 Pulmonary hypertension, unspecified: Secondary | ICD-10-CM

## 2013-12-15 DIAGNOSIS — I370 Nonrheumatic pulmonary valve stenosis: Secondary | ICD-10-CM

## 2013-12-15 HISTORY — DX: Personal history of (corrected) congenital malformations of heart and circulatory system: Z87.74

## 2013-12-15 NOTE — Progress Notes (Signed)
[name], female   DOB: [birth year], 73 y.o.   MRN: [id number]    [address] [place], [place]  [address] Phone: [phone] Fax:  [phone]  Date:  12/15/2013   ID:  Grace [place], DOB 11-01-1940, MRN [id number]  PCP:  No PCP Per Patient   ASSESSMENT:  1. Pulmonic stenosis with mild to moderate exertional symptoms 2. Pulmonary hypertension, a secondary residual of ASD 3. Status post CardioSEAL closure device for AST, 2004   PLAN:  1. Aerobic activity 2. Notified to call exertional fatigue, syncope, or edema 3. Clinical follow-up in one year. May consider echocardiography at that time   SUBJECTIVE: Grace Fletcher is a 73 y.o. female who is doing well. Recently went on a 17 mile bike ride that cause significant dyspnea. She had no lightheadedness or syncope. She denies orthopnea, PND, and persistent edema. No palpitations. No chest pain.   Wt Readings from Last 3 Encounters:  12/15/13 148 lb (67.132 kg)  05/26/13 143 lb 3.2 oz (64.955 kg)  11/11/12 136 lb (61.689 kg)     Past Medical History  Diagnosis Date  . Breast cancer   . Cyst of pancreas   . Pulmonary hypertension 05/13/2010  . Chest discomfort   . Pulmonary stenosis     Current Outpatient Prescriptions  Medication Sig Dispense Refill  . aspirin 81 MG tablet Take 81 mg by mouth daily.      Marland Kitchen buPROPion (WELLBUTRIN SR) 150 MG 12 hr tablet Take 150 mg by mouth every other day.     . Cholecalciferol (VITAMIN D) 1000 UNITS capsule Take 1,000 Units by mouth daily.    . citalopram (CELEXA) 40 MG tablet Take 1 tablet (40 mg total) by mouth daily. 30 tablet 3  . Docusate Calcium (STOOL SOFTENER PO) Take by mouth daily.    Marland Kitchen LORazepam (ATIVAN) 0.5 MG tablet Take 1 tablet (0.5 mg total) by mouth every 8 (eight) hours. (Patient taking differently: Take 0.5 mg by mouth every 8 (eight) hours as needed. ) 10 tablet 1  . Multiple Vitamin (MULTIVITAMIN) capsule Take 1 capsule by  mouth daily.       No current facility-administered medications for this visit.    Allergies:   No Known Allergies  Social History:  The patient  reports that she has quit smoking. She has never used smokeless tobacco. She reports that she does not drink alcohol or use illicit drugs.   ROS:  Please see the history of present illness.   Appetite is stable. No transient neurological complaints. No hemoptysis.   All other systems reviewed and negative.   OBJECTIVE: VS:  BP 122/74 mmHg  Pulse 63  Ht 5\' 4"  (1.626 m)  Wt 148 lb (67.132 kg)  BMI 25.39 kg/m2 Well nourished, well developed, in no acute distress, appears her stated age 70: normal Neck: JVD flat. Carotid bruit absent  Cardiac:  normal S1, S2; RRR; no murmur Lungs:  clear to auscultation bilaterally, no wheezing, rhonchi or rales Abd: soft, nontender, no hepatomegaly Ext: Edema trace bilateral. Pulses 2+ and symmetric Skin: warm and dry Neuro:  CNs 2-12 intact, no focal abnormalities noted  EKG:  Normal sinus rhythm with short PR interval right atrial abnormality otherwise unremarkable.       Signed, Illene Labrador III, MD 12/15/2013 4:12 PM

## 2013-12-15 NOTE — Patient Instructions (Signed)
Your physician recommends that you continue on your current medications as directed. Please refer to the Current Medication list given to you today.  Your physician wants you to follow-up in: 1 year with Dr.Smith You will receive a reminder letter in the mail two months in advance. If you don't receive a letter, please call our office to schedule the follow-up appointment.  

## 2013-12-26 DIAGNOSIS — Z1231 Encounter for screening mammogram for malignant neoplasm of breast: Secondary | ICD-10-CM | POA: Diagnosis not present

## 2013-12-26 DIAGNOSIS — Z853 Personal history of malignant neoplasm of breast: Secondary | ICD-10-CM | POA: Diagnosis not present

## 2014-01-15 ENCOUNTER — Other Ambulatory Visit: Payer: Self-pay | Admitting: Oncology

## 2014-05-16 ENCOUNTER — Other Ambulatory Visit (HOSPITAL_BASED_OUTPATIENT_CLINIC_OR_DEPARTMENT_OTHER): Payer: Medicare Other

## 2014-05-16 ENCOUNTER — Other Ambulatory Visit: Payer: Self-pay | Admitting: *Deleted

## 2014-05-16 DIAGNOSIS — Z853 Personal history of malignant neoplasm of breast: Secondary | ICD-10-CM | POA: Diagnosis present

## 2014-05-16 DIAGNOSIS — C50919 Malignant neoplasm of unspecified site of unspecified female breast: Secondary | ICD-10-CM

## 2014-05-16 LAB — COMPREHENSIVE METABOLIC PANEL (CC13)
ALBUMIN: 3.6 g/dL (ref 3.5–5.0)
ALK PHOS: 69 U/L (ref 40–150)
ALT: 11 U/L (ref 0–55)
ANION GAP: 7 meq/L (ref 3–11)
AST: 17 U/L (ref 5–34)
BUN: 7.2 mg/dL (ref 7.0–26.0)
CALCIUM: 9.1 mg/dL (ref 8.4–10.4)
CHLORIDE: 108 meq/L (ref 98–109)
CO2: 25 meq/L (ref 22–29)
Creatinine: 0.6 mg/dL (ref 0.6–1.1)
EGFR: 89 mL/min/{1.73_m2} — ABNORMAL LOW (ref >90)
GLUCOSE: 84 mg/dL (ref 70–140)
POTASSIUM: 4.1 meq/L (ref 3.5–5.1)
SODIUM: 141 meq/L (ref 136–145)
Total Bilirubin: 0.32 mg/dL (ref 0.20–1.20)
Total Protein: 6.3 g/dL — ABNORMAL LOW (ref 6.4–8.3)

## 2014-05-16 LAB — CBC WITH DIFFERENTIAL/PLATELET
BASO%: 0.3 % (ref 0.0–2.0)
Basophils Absolute: 0 10*3/uL (ref 0.0–0.1)
EOS%: 2.3 % (ref 0.0–7.0)
Eosinophils Absolute: 0.1 10*3/uL (ref 0.0–0.5)
HEMATOCRIT: 42.6 % (ref 34.8–46.6)
HGB: 14.2 g/dL (ref 11.6–15.9)
LYMPH#: 1.8 10*3/uL (ref 0.9–3.3)
LYMPH%: 29.6 % (ref 14.0–49.7)
MCH: 31.9 pg (ref 25.1–34.0)
MCHC: 33.3 g/dL (ref 31.5–36.0)
MCV: 95.7 fL (ref 79.5–101.0)
MONO#: 0.5 10*3/uL (ref 0.1–0.9)
MONO%: 7.9 % (ref 0.0–14.0)
NEUT#: 3.6 10*3/uL (ref 1.5–6.5)
NEUT%: 59.9 % (ref 38.4–76.8)
PLATELETS: 169 10*3/uL (ref 145–400)
RBC: 4.45 10*6/uL (ref 3.70–5.45)
RDW: 13.3 % (ref 11.2–14.5)
WBC: 6.1 10*3/uL (ref 3.9–10.3)

## 2014-05-22 ENCOUNTER — Other Ambulatory Visit: Payer: Self-pay | Admitting: Oncology

## 2014-05-23 ENCOUNTER — Telehealth: Payer: Self-pay | Admitting: Oncology

## 2014-05-23 ENCOUNTER — Ambulatory Visit (HOSPITAL_BASED_OUTPATIENT_CLINIC_OR_DEPARTMENT_OTHER): Payer: Medicare Other | Admitting: Oncology

## 2014-05-23 VITALS — BP 123/62 | HR 57 | Temp 98.2°F | Resp 18 | Ht 64.0 in | Wt 148.5 lb

## 2014-05-23 DIAGNOSIS — Q831 Accessory breast: Secondary | ICD-10-CM | POA: Diagnosis not present

## 2014-05-23 DIAGNOSIS — C50912 Malignant neoplasm of unspecified site of left female breast: Secondary | ICD-10-CM

## 2014-05-23 DIAGNOSIS — Z853 Personal history of malignant neoplasm of breast: Secondary | ICD-10-CM

## 2014-05-23 MED ORDER — CITALOPRAM HYDROBROMIDE 40 MG PO TABS: ORAL_TABLET | ORAL | Status: DC

## 2014-05-23 MED ORDER — LORAZEPAM 0.5 MG PO TABS: 0.5000 mg | ORAL_TABLET | Freq: Every day | ORAL | Status: DC | PRN

## 2014-05-23 NOTE — Telephone Encounter (Signed)
Gave patient avs report and appointments for April 2017 and left breast US at [facility] for 05/26/14 @ 11am.

## 2014-05-23 NOTE — Progress Notes (Signed)
ID: Ishana Blades Baudoin   DOB: [birth year]  MR#: [id number]  YIR#:[id number]  PCP: GYN: Dian Queen, MD JK:[id number] Dalbert Batman, MD OTHER MD: Daneen Schick, MD  CHIEF COMPLAINT:  Hx of Left Breast Cancer    HISTORY OF PRESENT ILLNESS: The patient moved to this area from [place] about 2004 and neglected her health maintenance.  She eventually had problems with the right carpal tunnel and was evaluated by Dr. Jackey Loge for this.  Dr. Almira Bar went beyond the immediate issue and set the patient up for a mammogram at [facility] on 11/21/2004.  This showed a suspicious mass in the left upper outer quadrant.  The next day, the patient was brought back for further evaluation and had breast specific gamma imaging, which showed in the extreme upper outer quadrant of the left breast 1.8 cm focus of intense activity.  There was no axillary activity noted.  An ultrasound confirmed the presence of the mass and this was biopsied the same day 11/22/2004.  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  With this information, the patient was referred to Dr. Dalbert Batman and after appropriate discussion, he proceeded to left lumpectomy with a sentinel lymph node sampling on 12/09/2004.  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  Subsequent history is as detailed below.  INTERVAL HISTORY: Shealyn returns today for followup of her left breast cancer. She is 10 years out from her original diagnosis and I was ready to release her today, but she has not been able to establish yourself with a primary care physician. The physician I referred her to does not take Medicare.  REVIEW  OF SYSTEMS: Keyanna continues to be consumed by the situation of her son in [place]. He remains incarcerated. They are in the middle of an appeal and she is hopeful that sometime in the next few weeks or months the original decision maybe over turned. She went off the bupropion. She did not think it was helping very much. Though she remains depressed and anxious because of this problem she is doing some exercise regularly and she does get out of the house now" do things". She has problems with palpitations at times, she can be short of breath when walking up stairs, as she has some arthritis pains here in there which are not more intense or persistent than before. The one issue that is bothering her aside from the family situation is "something in her left axilla" that she wanted me to check today. A detailed review of systems was otherwise stable  PAST MEDICAL HISTORY: Past Medical History  Diagnosis Date  . Breast cancer   . Cyst of pancreas   . Pulmonary hypertension 05/13/2010  . Chest discomfort   . Pulmonary stenosis   Significant for remote history of migraines, history of patent foramen ovale repair approximately 2002 after what may have been small strokes with no residual, history of palpitation secondary to the "patch" which is now much better controlled on Toprol, history of carpal tunnel repair, history of hemorrhoidectomy, history of colon polyp removal approximately 15 years ago, history of osteopenia, and history of mild osteoarthritis.  PAST  SURGICAL HISTORY: Past Surgical History  Procedure Laterality Date  . Breast lumpectomy      left  . Sentinel lymph node biopsy    . Cesarean section    . Carpal tunnel release      FAMILY HISTORY The patient's father died at the age of 42 from a myocardial infarction.  The patient's mother died at the age of nearly 71.  The patient has 1 sister, others no history of breast or ovarian cancer in the family.  GYNECOLOGIC HISTORY:  (Reviewed  05/26/2013) She is  GX P4,  change of life  at age 55.  She took Prempro until she had her heart surgery approximately 2002  SOCIAL HISTORY:  (Updated on 05/26/2013) The patient worked as a Network engineer, but is now retired, as is her husband [name] (goes by [employer]")   He was a Lexicographer.  They have 4 children.  Daughter Manuela Schwartz in [place] works in Pharmacologist; daughter Warden/ranger in [place] works in administration; son [name] in [place] was a Hydrologist with the [employer] but is currently incarcerated in [place]; and son Remo Lipps in  [place], [place] is a Land.  The patient has 1 grandchild and 1 great grandchild.  She attends the [employer].   ADVANCED DIRECTIVES: not in place  HEALTH MAINTENANCE:  (Updated 05/26/2013) History  Substance Use Topics  . Smoking status: Former Research scientist (life sciences)  . Smokeless tobacco: Never Used  . Alcohol Use: No     Colonoscopy:  Not on file  PAP: April 2014/[facility]  Bone density: Not on file  Lipid panel:  UTD/[facility]  No Known Allergies  Current Outpatient Prescriptions  Medication Sig Dispense Refill  . aspirin 81 MG tablet Take 81 mg by mouth daily.      . Cholecalciferol (VITAMIN D) 1000 UNITS capsule Take 1,000 Units by mouth daily.    . citalopram (CELEXA) 40 MG tablet TAKE 1 TABLET (40 MG TOTAL) BY MOUTH DAILY. 30 tablet 3  . Docusate Calcium (STOOL SOFTENER PO) Take by mouth daily.    Marland Kitchen LORazepam (ATIVAN) 0.5 MG tablet Take 1 tablet (0.5 mg total) by mouth daily as needed for anxiety. 10 tablet 1  . Multiple Vitamin (MULTIVITAMIN) capsule Take 1 capsule by mouth daily.       No current facility-administered medications for this visit.    OBJECTIVE: Middle-aged white woman who was tearful during today's visit  Filed Vitals:   05/23/14 1258  BP: 123/62  Pulse: 57  Temp: 98.2 F (36.8 C)  Resp: 18     Body mass index is 25.48 kg/(m^2).    ECOG FS: 0 Filed Weights   05/23/14 1258  Weight: 148 lb 8 oz (67.359 kg)     Sclerae unicteric, pupils equal and reactive Oropharynx clear and moist No cervical or supraclavicular adenopathy Lungs no rales or rhonchi Heart regular rate and rhythm Abd soft, nontender, positive bowel sounds MSK no focal spinal tenderness, no upper extremity lymphedema Neuro: nonfocal, well oriented, appropriate affect Breasts: The right breast is unremarkable. The left breast is status post lumpectomy and radiation. There is no evidence of breast recurrence. In the left axilla pushing laterally against the breast there is a 1 cm x 3 mm area of irregularity which is possibly scar tissue, but which I do not remember palpating previously.    LAB RESULTS: Lab Results  Component Value Date   WBC 6.1 05/16/2014   NEUTROABS 3.6 05/16/2014   HGB 14.2 05/16/2014  HCT 42.6 05/16/2014   MCV 95.7 05/16/2014   PLT 169 05/16/2014      Chemistry      Component Value Date/Time   NA 141 05/16/2014 1107   NA 140 04/16/2011 1311   K 4.1 05/16/2014 1107   K 4.2 04/16/2011 1311   CL 106 04/15/2012 1210   CL 107 04/16/2011 1311   CO2 25 05/16/2014 1107   CO2 21 04/16/2011 1311   BUN 7.2 05/16/2014 1107   BUN 14 04/16/2011 1311   CREATININE 0.6 05/16/2014 1107   CREATININE 0.64 04/16/2011 1311      Component Value Date/Time   CALCIUM 9.1 05/16/2014 1107   CALCIUM 9.3 04/16/2011 1311   ALKPHOS 69 05/16/2014 1107   ALKPHOS 63 04/16/2011 1311   AST 17 05/16/2014 1107   AST 21 04/16/2011 1311   ALT 11 05/16/2014 1107   ALT 9 04/16/2011 1311   BILITOT 0.32 05/16/2014 1107   BILITOT 0.7 04/16/2011 1311       STUDIES:  Mammography November 2015 at [facility] was unremarkable   ASSESSMENT: 74 y.o.  [facility] woman,   (1)  status post left lumpectomy and sentinel lymph node dissection November 2006 for a T2 N0 grade 1 invasive ductal carcinoma, strongly estrogen and progesterone receptor positive, with no HER2/neu amplification   (2) a low Oncotype score predicted a 10% risk of  recurrence of 6% if her only systemic adjuvant treatment was tamoxifen for five years.   (3) completed adjuvant radiation 03/27/2005  (4) on tamoxifen April of 2007 to April of 2012. The patient decided against aromatase inhibitors at that time.  PLAN:  I am setting Fahmida up for ultrasonography and possibly repeat mammography of the left breast through [facility] just to make sure we are not dealing with a small area of local recurrence.  Otherwise she will see me again in one year. If she has establish herself with a primary care physician at that time we will consider a releasing her to his or her care. Otherwise we will continue to see her on a yearly basis. She knows to call for any problems that may develop before the next visit here.  Chauncey Cruel, MD     05/23/2014

## 2014-05-26 DIAGNOSIS — M7989 Other specified soft tissue disorders: Secondary | ICD-10-CM | POA: Diagnosis not present

## 2014-05-29 ENCOUNTER — Telehealth: Payer: Self-pay

## 2014-05-29 NOTE — Telephone Encounter (Signed)
Ultrasoud results received from [facility] 4/29/116.  Reviewed by Dr. Jana Hakim.  Sent to scan.

## 2014-10-10 DIAGNOSIS — Z7982 Long term (current) use of aspirin: Secondary | ICD-10-CM | POA: Diagnosis not present

## 2014-10-10 DIAGNOSIS — K5900 Constipation, unspecified: Secondary | ICD-10-CM | POA: Diagnosis not present

## 2014-10-10 DIAGNOSIS — N3001 Acute cystitis with hematuria: Secondary | ICD-10-CM | POA: Diagnosis not present

## 2014-10-10 DIAGNOSIS — K529 Noninfective gastroenteritis and colitis, unspecified: Secondary | ICD-10-CM | POA: Diagnosis not present

## 2014-10-10 DIAGNOSIS — Z79899 Other long term (current) drug therapy: Secondary | ICD-10-CM | POA: Diagnosis not present

## 2014-10-10 DIAGNOSIS — R1030 Lower abdominal pain, unspecified: Secondary | ICD-10-CM | POA: Diagnosis not present

## 2014-10-17 DIAGNOSIS — K5900 Constipation, unspecified: Secondary | ICD-10-CM | POA: Diagnosis not present

## 2014-10-17 DIAGNOSIS — K625 Hemorrhage of anus and rectum: Secondary | ICD-10-CM | POA: Diagnosis not present

## 2014-10-17 DIAGNOSIS — R933 Abnormal findings on diagnostic imaging of other parts of digestive tract: Secondary | ICD-10-CM | POA: Diagnosis not present

## 2014-10-17 DIAGNOSIS — A09 Infectious gastroenteritis and colitis, unspecified: Secondary | ICD-10-CM | POA: Diagnosis not present

## 2014-10-17 DIAGNOSIS — R1032 Left lower quadrant pain: Secondary | ICD-10-CM | POA: Diagnosis not present

## 2014-11-07 DIAGNOSIS — R140 Abdominal distension (gaseous): Secondary | ICD-10-CM | POA: Diagnosis not present

## 2014-11-07 DIAGNOSIS — K5900 Constipation, unspecified: Secondary | ICD-10-CM | POA: Diagnosis not present

## 2014-11-28 DIAGNOSIS — Z124 Encounter for screening for malignant neoplasm of cervix: Secondary | ICD-10-CM | POA: Diagnosis not present

## 2014-11-28 DIAGNOSIS — Z6827 Body mass index (BMI) 27.0-27.9, adult: Secondary | ICD-10-CM | POA: Diagnosis not present

## 2014-11-28 DIAGNOSIS — Z01419 Encounter for gynecological examination (general) (routine) without abnormal findings: Secondary | ICD-10-CM | POA: Diagnosis not present

## 2014-12-29 DIAGNOSIS — H2513 Age-related nuclear cataract, bilateral: Secondary | ICD-10-CM | POA: Diagnosis not present

## 2015-01-27 ENCOUNTER — Other Ambulatory Visit: Payer: Self-pay | Admitting: Oncology

## 2015-01-31 NOTE — Progress Notes (Signed)
Cardiology Office Note   Date:  02/01/2015   ID:  Martina Weinreich Lynes, DOB December 01, 1940, MRN [id number]  PCP:  Chauncey Cruel, MD  Cardiologist:  Sinclair Grooms, MD   Chief Complaint  Patient presents with  . Atrial Septal Defect      History of Present Illness: Grace Fletcher is a 75 y.o. female who presents for pulmonic stenosis, prior percutaneous ASD repair, mild dilation of the right ventricle, , and history of chest pain.    Past Medical History  Diagnosis Date  . Breast cancer ([place])   . Cyst of pancreas   . Pulmonary hypertension ([place]) 05/13/2010  . Chest discomfort   . Pulmonary stenosis   . 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 Systolic function was mildly reduced. The estimated ejection fraction was in the range of 45% to 50%. There is hypokinesis of the anteroseptal myocardium. Doppler parameters are consistent with abnormal left ventricular relaxation (grade 1 diastolic dysfunction). - Right ventricle: The cavity size was mildly dilated. Wall thickness was normal. Systolic function was mildly reduced. - Right atrium: The atrium was mildly dilated. - Atrial septum: No atrial level shunt, at baseline with Doppler analysis. Prior ASD repair, CardioSeal 2004. - Pulmonic valve: Transvalvular velocity was increased, due to mild degree of stenosis. Peak velocity: 224cm/s (S). - Pulmonary arteries: Systolic pressure was mildly increased.    . Moderate tricuspid regurgitation by prior echocardiogram 05/13/2010  . Breast cancer, left breast ([place]) 04/23/2011  . Anxiety 05/26/2013  . Depression 05/26/2013  . 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 Breast lumpectomy      left  . Sentinel lymph node biopsy    . Cesarean section    . Carpal tunnel release       Current  Outpatient Prescriptions  Medication Sig Dispense Refill  . aspirin 81 MG tablet Take 81 mg by mouth daily.      . Cholecalciferol (VITAMIN D) 1000 UNITS capsule Take 1,000 Units by mouth daily.    . citalopram (CELEXA) 40 MG tablet TAKE 1 TABLET (40 MG TOTAL) BY MOUTH DAILY. 30 tablet 3  . Docusate Calcium (STOOL SOFTENER PO) Take by mouth daily.    Marland Kitchen LINZESS 145 MCG CAPS capsule Take 1 capsule by mouth every other day.  4  . Multiple Vitamin (MULTIVITAMIN) capsule Take 1 capsule by mouth daily.       No current facility-administered medications for this visit.    Allergies:   Review of patient's allergies indicates no known allergies.    Social History:  The patient  reports that she has quit smoking. She has never used smokeless tobacco. She reports that she does not drink alcohol or use illicit drugs.   Family History:  The patient's family history includes Arthritis in her mother; Dementia in her mother; Healthy in her sister; Heart attack in her father; Heart disease in her father.    ROS:  Please see the history of present illness.   Otherwise, review of systems are positive for cough, shortness of breath, easy bruising, irregular heartbeats, and slight depression..   All other systems are reviewed and negative.    PHYSICAL EXAM: VS:  BP 138/90 mmHg  Pulse 60  Ht 5\' 4"  (1.626 m)  Wt 154 lb 12.8 oz (70.217 kg)  BMI 26.56 kg/m2 , BMI Body mass index is 26.56 kg/(m^2). GEN: Well nourished, well developed, in no acute distress HEENT: normal Neck: no JVD, carotid bruits, or masses Cardiac: Regular RR.  There is no murmur, rub, or gallop. There is no edema. Respiratory:  clear to auscultation bilaterally, normal work of breathing. GI: soft, nontender, nondistended, + BS MS: no deformity or atrophy Skin: warm and dry, no rash Neuro:  Strength and sensation are intact Psych: euthymic mood, full affect   EKG:  EKG is ordered today. The ekg reveals normal sinus rhythm with low  voltage. Leftward axis is noted.   Recent Labs: 05/16/2014: ALT 11; BUN 7.2; Creatinine 0.6; HGB 14.2; Platelets 169; Potassium 4.1; Sodium 141    Lipid Panel No results found for: CHOL, TRIG, HDL, CHOLHDL, VLDL, LDLCALC, LDLDIRECT    Wt Readings from Last 3 Encounters:  02/01/15 154 lb 12.8 oz (70.217 kg)  05/23/14 148 lb 8 oz (67.359 kg)  12/15/13 148 lb (67.132 kg)      Other studies Reviewed: Additional studies/ records that were reviewed today include: Reviewed prior records and most recent echo is remote, August 2014... The findings include LVEF on that study 45-50%.    ASSESSMENT AND PLAN:  1. Pulmonary stenosis Clinically quiesced sent  2. Status post device closure of ASD No evidence of dislodgment. Now status post deployment greater than 10 years  3. Pulmonary hypertension ([place]) Not recently assessed  4. Elevated blood pressure of diagnosis of hypertension  Current medicines are reviewed at length with the patient today.  The patient has the following concerns regarding medicines: None.  The following changes/actions have been instituted:    2-D Doppler echocardiogram to follow-up pulmonic stenosis  Labs/ tests ordered today include:  No orders of the defined types were placed in this encounter.     Disposition:   FU with HS in 1 year  Signed, Sinclair Grooms, MD  02/01/2015 11:11 AM    [address], [place], [place]  [address] Phone: [phone]; Fax: [phone]

## 2015-02-01 ENCOUNTER — Ambulatory Visit (INDEPENDENT_AMBULATORY_CARE_PROVIDER_SITE_OTHER): Payer: Medicare Other | Admitting: Interventional Cardiology

## 2015-02-01 ENCOUNTER — Encounter: Payer: Self-pay | Admitting: Interventional Cardiology

## 2015-02-01 VITALS — BP 138/90 | HR 60 | Ht 64.0 in | Wt 154.8 lb

## 2015-02-01 DIAGNOSIS — Z8774 Personal history of (corrected) congenital malformations of heart and circulatory system: Secondary | ICD-10-CM | POA: Diagnosis not present

## 2015-02-01 DIAGNOSIS — I370 Nonrheumatic pulmonary valve stenosis: Secondary | ICD-10-CM | POA: Diagnosis not present

## 2015-02-01 DIAGNOSIS — Z9889 Other specified postprocedural states: Secondary | ICD-10-CM

## 2015-02-01 DIAGNOSIS — I272 Other secondary pulmonary hypertension: Secondary | ICD-10-CM

## 2015-02-01 NOTE — Patient Instructions (Signed)
Medication Instructions:  Your physician recommends that you continue on your current medications as directed. Please refer to the Current Medication list given to you today.   Labwork: None ordered  Testing/Procedures: Your physician has requested that you have an echocardiogram. Echocardiography is a painless test that uses sound waves to create images of your heart. It provides your doctor with information about the size and shape of your heart and how well your heart's chambers and valves are working. This procedure takes approximately one hour. There are no restrictions for this procedure.    Follow-Up: Your physician wants you to follow-up in: 1 year with Dr.Smith You will receive a reminder letter in the mail two months in advance. If you don't receive a letter, please call our office to schedule the follow-up appointment.   Any Other Special Instructions Will Be Listed Below (If Applicable).     If you need a refill on your cardiac medications before your next appointment, please call your pharmacy.   

## 2015-05-17 ENCOUNTER — Other Ambulatory Visit: Payer: Medicare Other

## 2015-05-21 ENCOUNTER — Other Ambulatory Visit: Payer: Self-pay

## 2015-05-21 DIAGNOSIS — C50912 Malignant neoplasm of unspecified site of left female breast: Secondary | ICD-10-CM

## 2015-05-22 ENCOUNTER — Other Ambulatory Visit (HOSPITAL_BASED_OUTPATIENT_CLINIC_OR_DEPARTMENT_OTHER): Payer: Medicare Other

## 2015-05-22 DIAGNOSIS — C50912 Malignant neoplasm of unspecified site of left female breast: Secondary | ICD-10-CM | POA: Diagnosis not present

## 2015-05-22 LAB — CBC WITH DIFFERENTIAL/PLATELET
BASO%: 0.6 % (ref 0.0–2.0)
Basophils Absolute: 0 10*3/uL (ref 0.0–0.1)
EOS%: 2.9 % (ref 0.0–7.0)
Eosinophils Absolute: 0.1 10*3/uL (ref 0.0–0.5)
HCT: 43.7 % (ref 34.8–46.6)
HGB: 14.2 g/dL (ref 11.6–15.9)
LYMPH%: 33.9 % (ref 14.0–49.7)
MCH: 31 pg (ref 25.1–34.0)
MCHC: 32.5 g/dL (ref 31.5–36.0)
MCV: 95.3 fL (ref 79.5–101.0)
MONO#: 0.4 10*3/uL (ref 0.1–0.9)
MONO%: 8.4 % (ref 0.0–14.0)
NEUT%: 54.2 % (ref 38.4–76.8)
NEUTROS ABS: 2.5 10*3/uL (ref 1.5–6.5)
PLATELETS: 183 10*3/uL (ref 145–400)
RBC: 4.59 10*6/uL (ref 3.70–5.45)
RDW: 13.8 % (ref 11.2–14.5)
WBC: 4.7 10*3/uL (ref 3.9–10.3)
lymph#: 1.6 10*3/uL (ref 0.9–3.3)

## 2015-05-22 LAB — COMPREHENSIVE METABOLIC PANEL
ALT: 10 U/L (ref 0–55)
ANION GAP: 7 meq/L (ref 3–11)
AST: 18 U/L (ref 5–34)
Albumin: 3.5 g/dL (ref 3.5–5.0)
Alkaline Phosphatase: 66 U/L (ref 40–150)
BILIRUBIN TOTAL: 0.7 mg/dL (ref 0.20–1.20)
BUN: 9.2 mg/dL (ref 7.0–26.0)
CO2: 24 meq/L (ref 22–29)
CREATININE: 0.8 mg/dL (ref 0.6–1.1)
Calcium: 9.2 mg/dL (ref 8.4–10.4)
Chloride: 108 mEq/L (ref 98–109)
EGFR: 76 mL/min/{1.73_m2} — ABNORMAL LOW (ref >90)
GLUCOSE: 84 mg/dL (ref 70–140)
Potassium: 4.1 mEq/L (ref 3.5–5.1)
SODIUM: 140 meq/L (ref 136–145)
TOTAL PROTEIN: 6.5 g/dL (ref 6.4–8.3)

## 2015-05-24 ENCOUNTER — Ambulatory Visit (HOSPITAL_BASED_OUTPATIENT_CLINIC_OR_DEPARTMENT_OTHER): Payer: Medicare Other | Admitting: Oncology

## 2015-05-24 VITALS — BP 127/63 | HR 62 | Temp 98.0°F | Resp 18 | Ht 64.0 in | Wt 156.7 lb

## 2015-05-24 DIAGNOSIS — Z853 Personal history of malignant neoplasm of breast: Secondary | ICD-10-CM

## 2015-05-24 DIAGNOSIS — C50912 Malignant neoplasm of unspecified site of left female breast: Secondary | ICD-10-CM

## 2015-05-24 NOTE — Progress Notes (Signed)
ID: Grace Fletcher   DOB: 01/25/1941  MR#: [id number]  YKZ#:[id number]  PCP: GYN: Grace Queen, MD [id number] Grace Batman, MD OTHER MD: Grace Schick, MD, Grace James MD  CHIEF COMPLAINT:  Hx of Left Breast Cancer  CURRENT TREATMENT: Observation    HISTORY OF PRESENT ILLNESS: From the earlier summary note:  The patient moved to this area from [place] about 2004 and neglected her health maintenance.  She eventually had problems with the right carpal tunnel and was evaluated by Dr. Jackey Fletcher for this.  Dr. Almira Fletcher went beyond the immediate issue and set the patient up for a mammogram at [facility] on 11/21/2004.  This showed a suspicious mass in the left upper outer quadrant.  The next day, the patient was brought back for further evaluation and had breast specific gamma imaging, which showed in the extreme upper outer quadrant of the left breast 1.8 cm focus of intense activity.  There was no axillary activity noted.  An ultrasound confirmed the presence of the mass and this was biopsied the same day 11/22/2004.  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  With this information, the patient was referred to Dr. Dalbert Fletcher and after appropriate discussion, he proceeded to left lumpectomy with a sentinel lymph node sampling on 12/09/2004.  The final pathology (S06 - [id number]) showed a 2.1 cm infiltrating  Grace Fletcher  tumor with lobular features, with no evidence of lymphovascular invasion, grade 1, and 0 of 2 sentinel lymph node involved by tumor for final stage of P2 N0 MX or stage 2A.  Subsequent history is as detailed below.  INTERVAL HISTORY: Grace Fletcher returns today for followup of her Estrogen receptor positive breast cancer.she had mammography at [facility] in the fall of 2016 and it was unremarkable. She is now a little over 10 years out from her  definitive surgery  REVIEW OF SYSTEMS: Grace Fletcher has "Silver sneakers" but doesn't use it. She just doesn't like to exercise. Occasional she takes a brief walk. She recently visited family in [place] where she has a new Grace Fletcher she says, and she was very active. This gave her some right groin pain which is already resolving. She does have a history of urinary tract infections but that does not appear to be active at present. Otherwise a detailed review of systems today was negative  PAST MEDICAL HISTORY: Past Medical History  Diagnosis Date  . Breast cancer ([place])   . Cyst of pancreas   . Pulmonary hypertension ([place]) 05/13/2010  . Chest discomfort   . Pulmonary stenosis   . 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 Systolic function was mildly reduced. The estimated ejection fraction was in the range of 45% to 50%. There is hypokinesis of the anteroseptal myocardium. Doppler parameters are consistent with abnormal left ventricular relaxation (grade 1 diastolic dysfunction). - Right ventricle: The cavity size was mildly dilated. Wall thickness was normal. Systolic function was mildly reduced. - Right atrium: The atrium was mildly dilated. - Atrial septum: No atrial level shunt, at baseline with Doppler analysis. Prior ASD repair, CardioSeal 2004. - Pulmonic valve: Transvalvular velocity was increased, due to mild degree of stenosis. Peak velocity: 224cm/s (S). - Pulmonary arteries: Systolic pressure was mildly increased.    . Moderate tricuspid regurgitation by prior echocardiogram 05/13/2010  . Breast  cancer, left breast ([place]) 04/23/2011  . Anxiety 05/26/2013  . Depression 05/26/2013  . 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  PAST SURGICAL HISTORY: Past Surgical History  Procedure Laterality Date  . Breast lumpectomy      left  . Sentinel lymph node biopsy    . Cesarean section    . Carpal tunnel release      FAMILY HISTORY The patient's father died at the age of 82 from a myocardial infarction.  The patient's mother died at the age of nearly 69.  The patient has 1 sister, others no history of breast or ovarian cancer in the family.  GYNECOLOGIC HISTORY:  (Reviewed 05/26/2013) She is  GX P4,  change of life  at age 55.  She took Prempro until she had her heart surgery approximately 2002  SOCIAL HISTORY:   The patient worked as a Network engineer, but is now retired, as is her husband Grace Fletcher (goes by [employer]")   He was a Lexicographer.  They have 4 children.  Daughter Grace Fletcher in [place] works in Pharmacologist; daughter Grace Fletcher in [place] works in administration; son Grace Fletcher in [place] was a Hydrologist with the [employer] but is currently finding a legal case that Grace Fletcher is going all the way to the [employer]; and son Grace Fletcher in  [place], [place] is a Land.  The patient has 2 grandchildren and 1 great grandchild.  She attends the [employer].   ADVANCED DIRECTIVES: not in place  HEALTH MAINTENANCE:  (Updated 05/26/2013) Social History  Substance Use Topics  . Smoking status: Former Research scientist (life sciences)  . Smokeless tobacco: Never Used  . Alcohol Use: No     Colonoscopy:  Not on file  PAP: April 2014/[facility]  Bone density: Not on file  Lipid panel:  UTD/[facility]  No Known Allergies  Current Outpatient Prescriptions  Medication Sig Dispense Refill  . aspirin 81 MG tablet Take 81 mg by mouth  daily.      . Cholecalciferol (VITAMIN D) 1000 UNITS capsule Take 1,000 Units by mouth daily.    . citalopram (CELEXA) 40 MG tablet TAKE 1 TABLET (40 MG TOTAL) BY MOUTH DAILY. 30 tablet 3  . Docusate Calcium (STOOL SOFTENER PO) Take by mouth daily.    Marland Kitchen LINZESS 145 MCG CAPS capsule Take 1 capsule by mouth every other day.  4  . Multiple Vitamin (MULTIVITAMIN) capsule Take 1 capsule by mouth daily.       No current facility-administered medications for this visit.    OBJECTIVE: Middle-aged white woman in no acute distress  Filed Vitals:   05/24/15 1335  BP: 127/63  Pulse: 62  Temp: 98 F (36.7 C)  Resp: 18     Body mass index is 26.88 kg/(m^2).    ECOG FS: 0 Filed Weights   05/24/15 1335  Weight: 156 lb 11.2 oz (71.079 kg)    Sclerae unicteric, EOMs intact Oropharynx clear, dentition in good repair No cervical or supraclavicular adenopathy Lungs no rales or rhonchi Heart regular rate and rhythm  Abd soft, nontender, positive bowel sounds MSK no focal spinal tenderness, no upper extremity lymphedema Neuro: nonfocal, well oriented, appropriate affect Breasts: There are bilateral implants. The right breast is otherwise unremarkable. The left breast is status post lumpectomy and radiation. There is no evidence of local recurrence. Left axilla is benign.   LAB RESULTS: Lab Results  Component Value Date   WBC 4.7 05/22/2015   NEUTROABS 2.5 05/22/2015   HGB 14.2 05/22/2015   HCT 43.7 05/22/2015   MCV 95.3 05/22/2015   PLT 183 05/22/2015      Chemistry      Component Value Date/Time   NA 140 05/22/2015 1202   NA 140 04/16/2011 1311   K 4.1 05/22/2015 1202   K 4.2 04/16/2011 1311   CL 106 04/15/2012 1210   CL 107 04/16/2011 1311   CO2 24 05/22/2015 1202   CO2 21 04/16/2011 1311   BUN 9.2 05/22/2015 1202   BUN 14 04/16/2011 1311   CREATININE 0.8 05/22/2015 1202   CREATININE 0.64 04/16/2011 1311      Component Value Date/Time   CALCIUM 9.2 05/22/2015 1202   CALCIUM  9.3 04/16/2011 1311   ALKPHOS 66 05/22/2015 1202   ALKPHOS 63 04/16/2011 1311   AST 18 05/22/2015 1202   AST 21 04/16/2011 1311   ALT 10 05/22/2015 1202   ALT 9 04/16/2011 1311   BILITOT 0.70 05/22/2015 1202   BILITOT 0.7 04/16/2011 1311       STUDIES:  Mammography November 2016 at [facility] was unremarkable   ASSESSMENT: 75 y.o.  [facility] woman,   (1)  status post left lumpectomy and sentinel lymph node dissection November 2006 for a T2 N0 grade 1 invasive ductal carcinoma, strongly estrogen and progesterone receptor positive, with no HER2/neu amplification   (2) a low Oncotype score predicted a 10% risk of recurrence of 6% if her only systemic adjuvant treatment was tamoxifen for five years.   (3) completed adjuvant radiation 03/27/2005  (4) on tamoxifen April of 2007 to April of 2012. The patient decided against aromatase inhibitors at that time.  PLAN:  Mersedes is now 10 years out from her definitive surgery, and I am very comfortable releasing her from follow-up.  I did offer her participation in our survivorship program. However she intends to establish herself with [facility] and she feels that will be enough. Of course she also follows up with Dr. Collene Mares for her gastrointestinal problems.  The problems with her son I hope we'll be resolved favorably. If it does I have asked her to let me know  In terms of breast cancer follow-up on [place] will need is yearly mammography and a yearly physician breast exam.  I will be glad to see her at any point in the future but as of now  We are making no further routine appointments for her here.   Chauncey Cruel, MD     05/24/2015

## 2015-06-01 ENCOUNTER — Other Ambulatory Visit: Payer: Self-pay | Admitting: Oncology

## 2015-09-26 ENCOUNTER — Other Ambulatory Visit: Payer: Self-pay | Admitting: Oncology

## 2015-10-08 ENCOUNTER — Other Ambulatory Visit: Payer: Self-pay

## 2015-10-08 ENCOUNTER — Ambulatory Visit (HOSPITAL_COMMUNITY): Payer: Medicare Other | Attending: Interventional Cardiology

## 2015-10-08 DIAGNOSIS — Q211 Atrial septal defect: Secondary | ICD-10-CM | POA: Diagnosis not present

## 2015-10-08 DIAGNOSIS — I370 Nonrheumatic pulmonary valve stenosis: Secondary | ICD-10-CM | POA: Diagnosis not present

## 2015-10-08 DIAGNOSIS — Z853 Personal history of malignant neoplasm of breast: Secondary | ICD-10-CM | POA: Diagnosis not present

## 2015-10-08 DIAGNOSIS — I351 Nonrheumatic aortic (valve) insufficiency: Secondary | ICD-10-CM | POA: Insufficient documentation

## 2015-10-08 DIAGNOSIS — I517 Cardiomegaly: Secondary | ICD-10-CM | POA: Diagnosis not present

## 2015-10-10 ENCOUNTER — Other Ambulatory Visit: Payer: Self-pay | Admitting: Oncology

## 2015-10-26 ENCOUNTER — Telehealth: Payer: Self-pay | Admitting: Interventional Cardiology

## 2015-10-26 NOTE — Telephone Encounter (Deleted)
error 

## 2015-11-06 ENCOUNTER — Ambulatory Visit (INDEPENDENT_AMBULATORY_CARE_PROVIDER_SITE_OTHER): Payer: Medicare Other | Admitting: Interventional Cardiology

## 2015-11-06 ENCOUNTER — Encounter (INDEPENDENT_AMBULATORY_CARE_PROVIDER_SITE_OTHER): Payer: Self-pay

## 2015-11-06 ENCOUNTER — Encounter: Payer: Self-pay | Admitting: Interventional Cardiology

## 2015-11-06 VITALS — BP 140/86 | HR 57 | Ht 64.0 in | Wt 153.4 lb

## 2015-11-06 DIAGNOSIS — Z9889 Other specified postprocedural states: Secondary | ICD-10-CM

## 2015-11-06 DIAGNOSIS — Z8774 Personal history of (corrected) congenital malformations of heart and circulatory system: Secondary | ICD-10-CM

## 2015-11-06 DIAGNOSIS — I370 Nonrheumatic pulmonary valve stenosis: Secondary | ICD-10-CM

## 2015-11-06 DIAGNOSIS — I071 Rheumatic tricuspid insufficiency: Secondary | ICD-10-CM | POA: Diagnosis not present

## 2015-11-06 DIAGNOSIS — I5022 Chronic systolic (congestive) heart failure: Secondary | ICD-10-CM

## 2015-11-06 MED ORDER — CARVEDILOL 3.125 MG PO TABS: 3.1250 mg | ORAL_TABLET | Freq: Two times a day (BID) | ORAL | 3 refills | Status: DC

## 2015-11-06 NOTE — Progress Notes (Signed)
Cardiology Office Note    Date:  11/06/2015   ID:  Grace Fletcher, DOB [birth year], MRN [id number]  PCP:  Chauncey Cruel, MD  Cardiologist: Sinclair Grooms, MD   Chief Complaint  Patient presents with  . Cardiac Valve Problem    History of Present Illness:  Grace Fletcher is a 75 y.o. female who presents for pulmonic stenosis, prior percutaneous ASD repair, mild dilation of the right ventricle, , and history of chest pain  EF has progressed to 35% estimated on echo from September. I personally reviewed the 2014 102,017 echocardiogram. LV function is not normal on either study. There is not a significant difference in the 2 with LV systolic function probably in the 40% range.  In speaking with the patient she feels relatively well. She is under a lot of emotional stress due to the imprisonment of her son. She denies orthopnea. She does have some mild exertional dyspnea. She does not believe this is significantly changed over the past 9-12 months.    Past Medical History:  Diagnosis Date  . Anxiety 05/26/2013  . Breast cancer ([place])   . Breast cancer, left breast ([place]) 04/23/2011  . Chest discomfort   . Cyst of pancreas   . Depression 05/26/2013  . Moderate tricuspid regurgitation by prior echocardiogram 05/13/2010  . Pulmonary hypertension 05/13/2010  . Pulmonary stenosis   . Pulmonary stenosis 05/13/2010   Study Conclusions  - Left ventricle: The cavity size was normal. Systolic function was mildly reduced. The estimated ejection fraction was in the range of 45% to 50%. There is hypokinesis of the anteroseptal myocardium. Doppler parameters are consistent with abnormal left ventricular relaxation (grade 1 diastolic dysfunction). - Right ventricle: The cavity size was mildly dilated. Wall thickness was normal. Systolic function was mildly reduced. - Right atrium: The atrium was mildly dilated. - Atrial septum: No atrial level shunt, at baseline with Doppler  analysis. Prior ASD repair, CardioSeal 2004. - Pulmonic valve: Transvalvular velocity was increased, due to mild degree of stenosis. Peak velocity: 224cm/s (S). - Pulmonary arteries: Systolic pressure was mildly increased.    . 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 BREAST LUMPECTOMY     left  . CARPAL TUNNEL RELEASE    . CESAREAN SECTION    . SENTINEL LYMPH NODE BIOPSY      Current Medications: Outpatient Medications Prior to Visit  Medication Sig Dispense Refill  . aspirin 81 MG tablet Take 81 mg by mouth daily.      . Cholecalciferol (VITAMIN D) 1000 UNITS capsule Take 1,000 Units by mouth daily.    . citalopram (CELEXA) 40 MG tablet TAKE 1 TABLET (40 MG TOTAL) BY MOUTH DAILY. 30 tablet 3  . Docusate Calcium (STOOL SOFTENER PO) Take 1 capsule by mouth daily.     Marland Kitchen LINZESS 145 MCG CAPS capsule Take 1 capsule by mouth every other day.  4  . Multiple Vitamin (MULTIVITAMIN) capsule Take 1 capsule by mouth daily.       No facility-administered medications prior to visit.      Allergies:   Review of patient's allergies indicates no known allergies.   Social History   Social History  . Marital status: Married    Spouse name: N/A  . Number of children: N/A  . Years of education: N/A   Social History Main Topics  .  Smoking status: Former Research scientist (life sciences)  . Smokeless tobacco: Never Used  . Alcohol use No  . Drug use: No  . Sexual activity: Yes    Birth control/ protection: Post-menopausal   Other Topics Concern  . None   Social History Narrative  . None     Family History:  The patient's family history includes Arthritis in her mother; Dementia in her mother; Healthy in her sister; Heart attack in her father; Heart disease in her father.   ROS:   Please see the history of present illness.    Occasional cough, easy bruising, occasional irregular  heartbeat. Episodes of sweating. States that she sleeps well.  All other systems reviewed and are negative.   PHYSICAL EXAM:   VS:  BP 140/86   Pulse (!) 57   Ht 5\' 4"  (1.626 m)   Wt 153 lb 6.4 oz (69.6 kg)   BMI 26.33 kg/m    GEN: Well nourished, well developed, in no acute distress  HEENT: normal  Neck: no JVD, carotid bruits, or masses Cardiac: RRR; RUSB 2/6 systolic murmur. No rub or gallop. No edema. Respiratory:  clear to auscultation bilaterally, normal work of breathing GI: soft, nontender, nondistended, + BS MS: no deformity or atrophy  Skin: warm and dry, no rash Neuro:  Alert and Oriented x 3, Strength and sensation are intact Psych: euthymic mood, full affect  Wt Readings from Last 3 Encounters:  11/06/15 153 lb 6.4 oz (69.6 kg)  05/24/15 156 lb 11.2 oz (71.1 kg)  02/01/15 154 lb 12.8 oz (70.2 kg)      Studies/Labs Reviewed:   EKG:  EKG  Sinus bradycardia 57 bpm. Otherwise unremarkable tracing with the exception of T wave flattening.  Recent Labs: 05/22/2015: ALT 10; BUN 9.2; Creatinine 0.8; HGB 14.2; Platelets 183; Potassium 4.1; Sodium 140   Lipid Panel No results found for: CHOL, TRIG, HDL, CHOLHDL, VLDL, LDLCALC, LDLDIRECT  Additional studies/ records that were reviewed today include:  Echocardiogram 09/2015  Study Conclusions   - Left ventricle: Very poor acoustic windows limit study Overall   LVEF appears mild to moderately depressed Consider limited echo   with contrast to evaluate function further. The cavity size was   normal. Wall thickness was normal. Systolic function was   moderately reduced. The estimated ejection fraction was in the   range of 35% to 40%. - Aortic valve: There was trivial regurgitation. - Right ventricle: The cavity size was mildly dilated. Wall   thickness was normal. Systolic function was mildly reduced. - Right atrium: The atrium was mildly dilated. - Pulmonic valve: Pulmonic valve is not well visualized Peak    gradient through the valve is 15 mm Hg. no significant change   from previous echo report.  Digital echo images were personally reviewed and for 2017 study was contrasted with the 2014 study with the patient. Multiple questions were answered.   Chest CT angio 2013  IMPRESSION:   1.  No acute process or explanation for patient's symptoms. 2.  Status post left axillary nodal dissection and bilateral breast implants.  No evidence of metastatic disease. 3.  Similar appearance of the heart and pulmonary arteries. Findings most likely represent pulmonary valve stenosis and pulmonary arterial hypertension. 4.  Stable cystic lesion in the pancreatic tail.  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  ASSESSMENT:    1. Nonrheumatic pulmonary valve stenosis   2. Moderate tricuspid regurgitation by prior echocardiogram   3. Status post device  closure of ASD   4. Chronic systolic HF (heart failure) (HCC)      PLAN:  In order of problems listed above:  1. No significant gradient noted on the most recent echo. Soft systolic murmur is present. Overall this problem is stable. 2. This is an echocardiographic finding. No significant clinical tricuspid regurgitation exists. 3. Status post percutaneous closure of secundum ASD. No evidence of left to right shunting. Right heart size is normal on echo. 4. Overall mild reduction in LV systolic function. Characterize the current condition as chronic combined systolic and diastolic heart failure. No specific etiology for the mild decrease in LV function is noted. Decrease in function is global. In the absence of angina, no ischemic evaluation is entertained at this time. Start carvedilol 3.125 mg daily for one week and then increase to twice a day. 2-3 month follow-up which time low-dose he is a ARB are ACE therapy will be started. With a long discussion concerning concerning the rationale for starting therapy. She seems to  understand that this may help block regression to symptomatic systolic dysfunction.    Medication Adjustments/Labs and Tests Ordered: Current medicines are reviewed at length with the patient today.  Concerns regarding medicines are outlined above.  Medication changes, Labs and Tests ordered today are listed in the Patient Instructions below. There are no Patient Instructions on file for this visit.   Signed, Sinclair Grooms, MD  11/06/2015 11:39 AM    [facility] [place], [place],   [address] Phone: [phone]; Fax: [phone]

## 2015-11-06 NOTE — Patient Instructions (Signed)
Medication Instructions:  1) START Carvedilol 3.125mg  twice daily.  For the first week take it only once daily and then increase it.  Labwork: None  Testing/Procedures: None  Follow-Up: Your physician recommends that you schedule a follow-up appointment in: December or January with Dr. Tamala Julian.   Any Other Special Instructions Will Be Listed Below (If Applicable).     If you need a refill on your cardiac medications before your next appointment, please call your pharmacy.

## 2015-11-07 ENCOUNTER — Other Ambulatory Visit: Payer: Self-pay

## 2015-11-07 MED ORDER — CITALOPRAM HYDROBROMIDE 40 MG PO TABS
ORAL_TABLET | ORAL | 3 refills | Status: DC
Start: 2015-11-07 — End: 2017-01-07

## 2015-11-12 ENCOUNTER — Other Ambulatory Visit: Payer: Self-pay | Admitting: *Deleted

## 2015-12-06 NOTE — Telephone Encounter (Signed)
error 

## 2016-02-07 ENCOUNTER — Ambulatory Visit (INDEPENDENT_AMBULATORY_CARE_PROVIDER_SITE_OTHER): Payer: Medicare Other | Admitting: Interventional Cardiology

## 2016-02-07 ENCOUNTER — Encounter: Payer: Self-pay | Admitting: Interventional Cardiology

## 2016-02-07 VITALS — BP 110/60 | HR 66 | Ht 64.0 in | Wt 154.0 lb

## 2016-02-07 DIAGNOSIS — I272 Pulmonary hypertension, unspecified: Secondary | ICD-10-CM | POA: Diagnosis not present

## 2016-02-07 DIAGNOSIS — I071 Rheumatic tricuspid insufficiency: Secondary | ICD-10-CM

## 2016-02-07 DIAGNOSIS — I5042 Chronic combined systolic (congestive) and diastolic (congestive) heart failure: Secondary | ICD-10-CM | POA: Diagnosis not present

## 2016-02-07 DIAGNOSIS — I370 Nonrheumatic pulmonary valve stenosis: Secondary | ICD-10-CM | POA: Diagnosis not present

## 2016-02-07 DIAGNOSIS — Z8774 Personal history of (corrected) congenital malformations of heart and circulatory system: Secondary | ICD-10-CM

## 2016-02-07 DIAGNOSIS — Z9889 Other specified postprocedural states: Secondary | ICD-10-CM

## 2016-02-07 MED ORDER — LOSARTAN POTASSIUM 25 MG PO TABS: 25.0000 mg | ORAL_TABLET | Freq: Every day | ORAL | 3 refills | Status: DC

## 2016-02-07 NOTE — Progress Notes (Signed)
Cardiology Office Note    Date:  02/07/2016   ID:  [name], DOB Feb 13, 1940, MRN [id number]  PCP:  Chauncey Cruel, MD  Cardiologist: Sinclair Grooms, MD   Chief Complaint  Patient presents with  . Congestive Heart Failure    History of Present Illness:  Grace Fletcher is a 76 y.o. female who presents for pulmonic stenosis, prior percutaneous ASD repair, mild dilation of the right ventricle,Relatively newly diagnosed systolic dysfunction , and history of chest pain  She has not taken her medications as prescribed. She has only been taking Cardura vein along one time daily. She did take it twice a day for a while but then started for getting a p.m. dose. On twice a day dosing she felt well.   Past Medical History:  Diagnosis Date  . Anxiety 05/26/2013  . Breast cancer ([place])   . Breast cancer, left breast ([place]) 04/23/2011  . Chest discomfort   . Cyst of pancreas   . Depression 05/26/2013  . Moderate tricuspid regurgitation by prior echocardiogram 05/13/2010  . Pulmonary hypertension 05/13/2010  . Pulmonary stenosis   . Pulmonary stenosis 05/13/2010   Study Conclusions  - Left ventricle: The cavity size was normal. Systolic function was mildly reduced. The estimated ejection fraction was in the range of 45% to 50%. There is hypokinesis of the anteroseptal myocardium. Doppler parameters are consistent with abnormal left ventricular relaxation (grade 1 diastolic dysfunction). - Right ventricle: The cavity size was mildly dilated. Wall thickness was normal. Systolic function was mildly reduced. - Right atrium: The atrium was mildly dilated. - Atrial septum: No atrial level shunt, at baseline with Doppler analysis. Prior ASD repair, CardioSeal 2004. - Pulmonic valve: Transvalvular velocity was increased, due to mild degree of stenosis. Peak velocity: 224cm/s (S). - Pulmonary arteries: Systolic pressure was mildly increased.    . Status post device closure of  ASD 12/15/2013   CardioSEAL device, 2004; [facility]     Past Surgical History:  Procedure Laterality Date  . BREAST LUMPECTOMY     left  . CARPAL TUNNEL RELEASE    . CESAREAN SECTION    . SENTINEL LYMPH NODE BIOPSY      Current Medications: Outpatient Medications Prior to Visit  Medication Sig Dispense Refill  . aspirin 81 MG tablet Take 81 mg by mouth daily.      . Cholecalciferol (VITAMIN D) 1000 UNITS capsule Take 1,000 Units by mouth daily.    . citalopram (CELEXA) 40 MG tablet TAKE 1 TABLET (40 MG TOTAL) BY MOUTH DAILY. 30 tablet 3  . Docusate Calcium (STOOL SOFTENER PO) Take 1 capsule by mouth daily.     . Multiple Vitamin (MULTIVITAMIN) capsule Take 1 capsule by mouth daily.      . carvedilol (COREG) 3.125 MG tablet Take 1 tablet (3.125 mg total) by mouth 2 (two) times daily. 180 tablet 3  . LINZESS 145 MCG CAPS capsule Take 1 capsule by mouth every other day.  4   No facility-administered medications prior to visit.      Allergies:   Patient has no known allergies.   Social History   Social History  . Marital status: Married    Spouse name: N/A  . Number of children: N/A  . Years of education: N/A   Social History Main Topics  . Smoking status: Former Research scientist (life sciences)  . Smokeless tobacco: Never Used  . Alcohol use No  . Drug use: No  . Sexual activity:  Yes    Birth control/ protection: Post-menopausal   Other Topics Concern  . None   Social History Narrative  . None     Family History:  The patient's family history includes Arthritis in her mother; Dementia in her mother; Healthy in her sister; Heart attack in her father; Heart disease in her father.   ROS:   Please see the history of present illness.    She complains of upper respiratory congestion and nasal stuffiness. No chest discomfort with exertion but states while at rest there is a continuous sensation of chest tightness that she feels is related to indigestion  All other  systems reviewed and are negative.   PHYSICAL EXAM:   VS:  BP 110/60 (BP Location: Left Arm)   Pulse 66   Ht 5\' 4"  (1.626 m)   Wt 154 lb (69.9 kg)   BMI 26.43 kg/m    GEN: Well nourished, well developed, in no acute distress  HEENT: normal  Neck: no JVD, carotid bruits, or masses Cardiac: RRR; no murmurs, rubs, or gallops,no edema  Respiratory:  clear to auscultation bilaterally, normal work of breathing GI: soft, nontender, nondistended, + BS MS: no deformity or atrophy  Skin: warm and dry, no rash Neuro:  Alert and Oriented x 3, Strength and sensation are intact Psych: euthymic mood, full affect  Wt Readings from Last 3 Encounters:  02/07/16 154 lb (69.9 kg)  11/06/15 153 lb 6.4 oz (69.6 kg)  05/24/15 156 lb 11.2 oz (71.1 kg)      Studies/Labs Reviewed:   EKG:  EKG  Did not repeat her EKG on this office visit.  Recent Labs: 05/22/2015: ALT 10; BUN 9.2; Creatinine 0.8; HGB 14.2; Platelets 183; Potassium 4.1; Sodium 140   Lipid Panel No results found for: CHOL, TRIG, HDL, CHOLHDL, VLDL, LDLCALC, LDLDIRECT  Additional studies/ records that were reviewed today include:  Echocardiography 10/08/15: Study Conclusions  - Left ventricle: Very poor acoustic windows limit study Overall   LVEF appears mild to moderately depressed Consider limited echo   with contrast to evaluate function further. The cavity size was   normal. Wall thickness was normal. Systolic function was   moderately reduced. The estimated ejection fraction was in the   range of 35% to 40%. - Aortic valve: There was trivial regurgitation. - Right ventricle: The cavity size was mildly dilated. Wall   thickness was normal. Systolic function was mildly reduced. - Right atrium: The atrium was mildly dilated. - Pulmonic valve: Pulmonic valve is not well visualized Peak   gradient through the valve is 15 mm Hg. no significant change   from previous echo report.    ASSESSMENT:    1. Chronic combined  systolic and diastolic heart failure ([place])   2. Nonrheumatic pulmonary valve stenosis   3. Pulmonary hypertension   4. Moderate tricuspid regurgitation by prior echocardiogram   5. Status post device closure of ASD      PLAN:  In order of problems listed above:  1. We discussed the importance of heart failure therapy compliance. She will take carvedilol twice daily for week and if no side effects started losartan 25 mg per day. Follow-up in office in 2 weeks with an advanced Practice Provider for further up titration of beta blocker therapy if possible. Basic metabolic panel should be performed since starting losartan. She should follow-up with me in 2-3 months. In 3-6 months I will repeat her echocardiogram. 2. Not addressed. 3. Clinically silent. 4. Not addressed.  This was a longer than usual office visit because we spent significant time discussing systolic dysfunction, management, prognosis, and natural history with and without appropriate medical therapy. She seems to understand.  Medication Adjustments/Labs and Tests Ordered: Current medicines are reviewed at length with the patient today.  Concerns regarding medicines are outlined above.  Medication changes, Labs and Tests ordered today are listed in the Patient Instructions below. Patient Instructions  Medication Instructions:  Your physician has recommended you make the following change in your medication: 1. Increase Coreg 3.125 mg to twice daily as prescribed. 2. In one (1) week, START losartan 25 mg by mouth daily.   Labwork: Your physician recommends that you return for lab work in: 3 weeks at the time of your follow up appointment.    Testing/Procedures: None  Follow-Up: Your physician recommends that you schedule a follow-up appointment in: 3 weeks with a PA or NP for possible further titration of medication.  Your physician wants you to follow-up in: 3 months with Dr. Tamala Julian.  You will receive a reminder letter in  the mail two months in advance. If you don't receive a letter, please call our office to schedule the follow-up appointment.    Any Other Special Instructions Will Be Listed Below (If Applicable).     If you need a refill on your cardiac medications before your next appointment, please call your pharmacy.      Signed, Sinclair Grooms, MD  02/07/2016 1:11 PM    [facility] [place], [place], [place]  [address] Phone: [phone]; Fax: [phone]

## 2016-02-07 NOTE — Patient Instructions (Addendum)
Medication Instructions:  Your physician has recommended you make the following change in your medication: 1. Increase Coreg 3.125 mg to twice daily as prescribed. 2. In one (1) week, START losartan 25 mg by mouth daily.   Labwork: Your physician recommends that you return for lab work in: 3 weeks at the time of your follow up appointment.    Testing/Procedures: None  Follow-Up: Your physician recommends that you schedule a follow-up appointment in: 3 weeks with a PA or NP for possible further titration of medication.  Your physician wants you to follow-up in: 3 months with Dr. Tamala Julian.  You will receive a reminder letter in the mail two months in advance. If you don't receive a letter, please call our office to schedule the follow-up appointment.    Any Other Special Instructions Will Be Listed Below (If Applicable).     If you need a refill on your cardiac medications before your next appointment, please call your pharmacy.

## 2016-03-03 ENCOUNTER — Ambulatory Visit: Payer: Medicare Other | Admitting: Physician Assistant

## 2016-03-04 ENCOUNTER — Ambulatory Visit: Payer: Medicare Other | Admitting: Physician Assistant

## 2016-03-10 NOTE — Progress Notes (Signed)
Cardiology Office Note    Date:  03/13/2016   ID:  Grace Fletcher, DOB 03-29-1940, MRN [id number]  PCP:  Chauncey Cruel, MD  Cardiologist:  Dr. Tamala Julian   CC: follow up   History of Present Illness:  Grace Fletcher is a 76 y.o. female with a history of congenital pulmonic stenosis, prior percutaneous ASD repair (2004), chronic combined S/D CHF, history of breast cancer s/p lumpectomy and radiation and history of chest pain who presents to clinic for follow up.   She had a stress echo in 02/15/10 that showed a pulmonary pressure of 40-81mm with moderate tricuspid regurgitation, no ischemia, and relatively good exercise tolerance.  2D ECHO in 10/2012 showed EF 45-50%, G1DD, mild RAE, mild pulm stenosis. 2D ECHO in 09/2015 showed EF 35-40%, mild RV dilation, mild RA dilation, pulmonic valve not well visualized but no significant change.   Recently seen by Dr. Tamala Julian in the office on 02/07/16. She was not taking her CHF meds as prescribed. She was started on Coreg 3.125mg  BID with plans to start Losartan 25mg  daily in 1 week if no side effects.  Today she presents to clinic for follow up. Actually feeling more tired and SOB since starting the Coreg twice a day and losartan. Not sure if it is related to medication changes or stress/anxiety given recent problems with her son.   No CP or SOB. No LE edema, orthopnea or PND. No dizziness or syncope. No blood in stool or urine. Gets occasional palpitations. She was told she had PAD by home health care RN. No pain in legs with walking. She also has had a heaviness in her chest and some SOB with exertion. Strong family history of CAD in her father who had first MI in 30s. She is a never smoker.     Past Medical History:  Diagnosis Date  . Anxiety 05/26/2013  . Breast cancer ([place])   . Breast cancer, left breast ([place]) 04/23/2011  . Chest discomfort   . Cyst of pancreas   . Depression 05/26/2013  . Moderate tricuspid regurgitation by prior  echocardiogram 05/13/2010  . Pulmonary hypertension 05/13/2010  . Pulmonary stenosis   . Pulmonary stenosis 05/13/2010   Study Conclusions  - Left ventricle: The cavity size was normal. Systolic function was mildly reduced. The estimated ejection fraction was in the range of 45% to 50%. There is hypokinesis of the anteroseptal myocardium. Doppler parameters are consistent with abnormal left ventricular relaxation (grade 1 diastolic dysfunction). - Right ventricle: The cavity size was mildly dilated. Wall thickness was normal. Systolic function was mildly reduced. - Right atrium: The atrium was mildly dilated. - Atrial septum: No atrial level shunt, at baseline with Doppler analysis. Prior ASD repair, CardioSeal 2004. - Pulmonic valve: Transvalvular velocity was increased, due to mild degree of stenosis. Peak velocity: 224cm/s (S). - Pulmonary arteries: Systolic pressure was mildly increased.    . 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   Card[facility]SEAL device, 2004; [facility]     Past Surgical History:  Procedure Laterality Date  . BREAST LUMPECTOMY     left  . CARPAL TUNNEL RELEASE    . CESAREAN SECTION    . SENTINEL LYMPH NODE BIOPSY      Current Medications: Outpatient Medications Prior to Visit  Medication Sig Dispense Refill  . aspirin 81 MG tablet Take 81 mg by mouth daily.      . carvedilol (COREG) 3.125 MG tablet Take 3.125 mg by mouth 2 (  two) times daily with a meal.    . Cholecalciferol (VITAMIN D) 1000 UNITS capsule Take 1,000 Units by mouth daily.    . citalopram (CELEXA) 40 MG tablet TAKE 1 TABLET (40 MG TOTAL) BY MOUTH DAILY. 30 tablet 3  . Docusate Calcium (STOOL SOFTENER PO) Take 1 capsule by mouth daily.     Marland Kitchen losartan (COZAAR) 25 MG tablet Take 1 tablet (25 mg total) by mouth daily. 90 tablet 3  . Multiple Vitamin (MULTIVITAMIN) capsule Take 1 capsule by mouth daily.       No facility-administered medications prior to visit.       Allergies:   Patient has no known allergies.   Social History   Social History  . Marital status: Married    Spouse name: N/A  . Number of children: N/A  . Years of education: N/A   Social History Main Topics  . Smoking status: Former Research scientist (life sciences)  . Smokeless tobacco: Never Used  . Alcohol use No  . Drug use: No  . Sexual activity: Yes    Birth control/ protection: Post-menopausal   Other Topics Concern  . None   Social History Narrative  . None     Family History:  The patient's family history includes Arthritis in her mother; Dementia in her mother; Healthy in her sister; Heart attack in her father; Heart disease in her father.      ROS:   Please see the history of present illness.    ROS All other systems reviewed and are negative.   PHYSICAL EXAM:   VS:  BP 112/70   Pulse 61   Ht 5\' 4"  (1.626 m)   Wt 153 lb 6.4 oz (69.6 kg)   SpO2 97%   BMI 26.33 kg/m    GEN: Well nourished, well developed, in no acute distress  HEENT: normal  Neck: no JVD, carotid bruits, or masses Cardiac: RRR; no murmurs, rubs, or gallops,no edema  Respiratory:  clear to auscultation bilaterally, normal work of breathing GI: soft, nontender, nondistended, + BS MS: no deformity or atrophy  Skin: warm and dry, no rash Neuro:  Alert and Oriented x 3, Strength and sensation are intact Psych: euthymic mood, full affect   Wt Readings from Last 3 Encounters:  03/13/16 153 lb 6.4 oz (69.6 kg)  02/07/16 154 lb (69.9 kg)  11/06/15 153 lb 6.4 oz (69.6 kg)     Studies/Labs Reviewed:   EKG:  EKG is NOT  ordered today.    Recent Labs: 05/22/2015: ALT 10; BUN 9.2; Creatinine 0.8; HGB 14.2; Platelets 183; Potassium 4.1; Sodium 140   Lipid Panel No results found for: CHOL, TRIG, HDL, CHOLHDL, VLDL, LDLCALC, LDLDIRECT  Additional studies/ records that were reviewed today include:  2D ECHO  10/08/15: Study Conclusions - Left ventricle: Very poor acoustic windows limit study Overall LVEF  appears mild to moderately depressed Consider limited echo with contrast to evaluate function further. The cavity size was normal. Wall thickness was normal. Systolic function was moderately reduced. The estimated ejection fraction was in the range of 35% to 40%. - Aortic valve: There was trivial regurgitation. - Right ventricle: The cavity size was mildly dilated. Wall thickness was normal. Systolic function was mildly reduced. - Right atrium: The atrium was mildly dilated. - Pulmonic valve: Pulmonic valve is not well visualized Peak gradient through the valve is 15 mm Hg. no significant change from previous echo report.   ASSESSMENT & PLAN:   Chronic combined S/D CHF: recently started on  Coreg 3.125mg  BID and Losartan 25mg  daily. BMET, repeat echo in 3-6 months. She has never had any clinical heart failure. Feels more SOB with the new medications. Also have DOE and chest tightness. Will get exercise myoview. Keep meds the same for right now. If myoview low risk and still SOB we can try Toprol XL or stop it to see if sx improve.   ASD s/p repair:  Stable by most recent echo  Pulmonic stenosis: congenital. Stable by most recent echo  Tricuspid regurgitation: mild by last echo.     Medication Adjustments/Labs and Tests Ordered: Current medicines are reviewed at length with the patient today.  Concerns regarding medicines are outlined above.  Medication changes, Labs and Tests ordered today are listed in the Patient Instructions below. Patient Instructions  Medication Instructions:  Your physician recommends that you continue on your current medications as directed. Please refer to the Current Medication list given to you today.   Labwork: TODAY:  BMET  Testing/Procedures: Your physician has requested that you have en exercise stress myoview. For further information please visit [email or web address]. Please follow instruction sheet, as given.   Follow-Up: Your  physician recommends that you schedule a follow-up appointment in: 04/2016 WITH DR. Tamala Julian  Any Other Special Instructions Will Be Listed Below (If Applicable).   Exercise Stress Electrocardiogram An exercise stress electrocardiogram is a test that is done to evaluate the blood supply to your heart. This test may also be called exercise stress electrocardiography. The test is done while you are walking on a treadmill. The goal of this test is to raise your heart rate. This test is done to find areas of poor blood flow to the heart by determining the extent of coronary artery disease (CAD). CAD is defined as narrowing in one or more heart (coronary) arteries of more than 70%. If you have an abnormal test result, this may mean that you are not getting adequate blood flow to your heart during exercise. Additional testing may be needed to understand why your test was abnormal. Tell a health care provider about:  Any allergies you have.  All medicines you are taking, including vitamins, herbs, eye drops, creams, and over-the-counter medicines.  Any problems you or family members have had with anesthetic medicines.  Any blood disorders you have.  Any surgeries you have had.  Any medical conditions you have.  Possibility of pregnancy, if this applies. What are the risks? Generally, this is a safe procedure. However, as with any procedure, complications can occur. Possible complications can include:  Pain or pressure in the following areas:  Chest.  Jaw or neck.  Between your shoulder blades.  Radiating down your left arm.  Dizziness or light-headedness.  Shortness of breath.  Increased or irregular heartbeats.  Nausea or vomiting.  Heart attack (rare). What happens before the procedure?  Avoid all forms of caffeine 24 hours before your test or as directed by your health care provider. This includes coffee, tea (even decaffeinated tea), caffeinated sodas, chocolate, cocoa, and  certain pain medicines.  Follow your health care provider's instructions regarding eating and drinking before the test.  Take your medicines as directed at regular times with water unless instructed otherwise. Exceptions may include:  If you have diabetes, ask how you are to take your insulin or pills. It is common to adjust insulin dosing the morning of the test.  If you are taking beta-blocker medicines, it is important to talk to your health care provider about these medicines well  before the date of your test. Taking beta-blocker medicines may interfere with the test. In some cases, these medicines need to be changed or stopped 24 hours or more before the test.  If you wear a nitroglycerin patch, it may need to be removed prior to the test. Ask your health care provider if the patch should be removed before the test.  If you use an inhaler for any breathing condition, bring it with you to the test.  If you are an outpatient, bring a snack so you can eat right after the stress phase of the test.  Do not smoke for 4 hours prior to the test or as directed by your health care provider.  Do not apply lotions, powders, creams, or oils on your chest prior to the test.  Wear loose-fitting clothes and comfortable shoes for the test. This test involves walking on a treadmill. What happens during the procedure?  Multiple patches (electrodes) will be put on your chest. If needed, small areas of your chest may have to be shaved to get better contact with the electrodes. Once the electrodes are attached to your body, multiple wires will be attached to the electrodes and your heart rate will be monitored.  Your heart will be monitored both at rest and while exercising.  You will walk on a treadmill. The treadmill will be started at a slow pace. The treadmill speed and incline will gradually be increased to raise your heart rate. What happens after the procedure?  Your heart rate and blood pressure  will be monitored after the test.  You may return to your normal schedule including diet, activities, and medicines, unless your health care provider tells you otherwise. This information is not intended to replace advice given to you by your health care provider. Make sure you discuss any questions you have with your health care provider. Document Released: 01/11/2000 Document Revised: 06/21/2015 Document Reviewed: 09/20/2012 Elsevier Interactive Patient Education  2017 [employer].   If you need a refill on your cardiac medications before your next appointment, please call your pharmacy.      Signed, Angelena Form, PA-C  03/13/2016 10:56 AM    [facility] [place], [place], [place]  [address] Phone: [phone]; Fax: [phone]

## 2016-03-13 ENCOUNTER — Telehealth (HOSPITAL_COMMUNITY): Payer: Self-pay | Admitting: *Deleted

## 2016-03-13 ENCOUNTER — Encounter: Payer: Self-pay | Admitting: Physician Assistant

## 2016-03-13 ENCOUNTER — Ambulatory Visit (INDEPENDENT_AMBULATORY_CARE_PROVIDER_SITE_OTHER): Payer: Medicare Other | Admitting: Physician Assistant

## 2016-03-13 VITALS — BP 112/70 | HR 61 | Ht 64.0 in | Wt 153.4 lb

## 2016-03-13 DIAGNOSIS — Z9889 Other specified postprocedural states: Secondary | ICD-10-CM

## 2016-03-13 DIAGNOSIS — I071 Rheumatic tricuspid insufficiency: Secondary | ICD-10-CM | POA: Diagnosis not present

## 2016-03-13 DIAGNOSIS — R079 Chest pain, unspecified: Secondary | ICD-10-CM

## 2016-03-13 DIAGNOSIS — I370 Nonrheumatic pulmonary valve stenosis: Secondary | ICD-10-CM | POA: Diagnosis not present

## 2016-03-13 DIAGNOSIS — Z8774 Personal history of (corrected) congenital malformations of heart and circulatory system: Secondary | ICD-10-CM

## 2016-03-13 DIAGNOSIS — I5042 Chronic combined systolic (congestive) and diastolic (congestive) heart failure: Secondary | ICD-10-CM

## 2016-03-13 LAB — BASIC METABOLIC PANEL
BUN/Creatinine Ratio: 21 (ref 12–28)
BUN: 14 mg/dL (ref 8–27)
CO2: 23 mmol/L (ref 18–29)
CREATININE: 0.66 mg/dL (ref 0.57–1.00)
Calcium: 9.6 mg/dL (ref 8.7–10.3)
Chloride: 102 mmol/L (ref 96–106)
GFR calc Af Amer: 101 mL/min/{1.73_m2} (ref >59)
GFR, EST NON AFRICAN AMERICAN: 87 mL/min/{1.73_m2} (ref >59)
GLUCOSE: 90 mg/dL (ref 65–99)
Potassium: 4.3 mmol/L (ref 3.5–5.2)
SODIUM: 141 mmol/L (ref 134–144)

## 2016-03-13 NOTE — Telephone Encounter (Signed)
Left message on voicemail per DPR in reference to upcoming appointment scheduled on 03/18/16 with detailed instructions given per Myocardial Perfusion Study Information Sheet for the test. LM to arrive 15 minutes early, and that it is imperative to arrive on time for appointment to keep from having the test rescheduled. If you need to cancel or reschedule your appointment, please call the office within 24 hours of your appointment. Failure to do so may result in a cancellation of your appointment, and a $50 no show fee. Phone number given for call back for any questions. Kizzy Olafson Jacqueline    

## 2016-03-13 NOTE — Patient Instructions (Addendum)
Medication Instructions:  Your physician recommends that you continue on your current medications as directed. Please refer to the Current Medication list given to you today.   Labwork: TODAY:  BMET  Testing/Procedures: Your physician has requested that you have en exercise stress myoview. For further information please visit [email or web address]. Please follow instruction sheet, as given.   Follow-Up: Your physician recommends that you schedule a follow-up appointment in: 04/2016 WITH DR. Tamala Julian  Any Other Special Instructions Will Be Listed Below (If Applicable).   Exercise Stress Electrocardiogram An exercise stress electrocardiogram is a test that is done to evaluate the blood supply to your heart. This test may also be called exercise stress electrocardiography. The test is done while you are walking on a treadmill. The goal of this test is to raise your heart rate. This test is done to find areas of poor blood flow to the heart by determining the extent of coronary artery disease (CAD). CAD is defined as narrowing in one or more heart (coronary) arteries of more than 70%. If you have an abnormal test result, this may mean that you are not getting adequate blood flow to your heart during exercise. Additional testing may be needed to understand why your test was abnormal. Tell a health care provider about:  Any allergies you have.  All medicines you are taking, including vitamins, herbs, eye drops, creams, and over-the-counter medicines.  Any problems you or family members have had with anesthetic medicines.  Any blood disorders you have.  Any surgeries you have had.  Any medical conditions you have.  Possibility of pregnancy, if this applies. What are the risks? Generally, this is a safe procedure. However, as with any procedure, complications can occur. Possible complications can include:  Pain or pressure in the following areas:  Chest.  Jaw or neck.  Between your  shoulder blades.  Radiating down your left arm.  Dizziness or light-headedness.  Shortness of breath.  Increased or irregular heartbeats.  Nausea or vomiting.  Heart attack (rare). What happens before the procedure?  Avoid all forms of caffeine 24 hours before your test or as directed by your health care provider. This includes coffee, tea (even decaffeinated tea), caffeinated sodas, chocolate, cocoa, and certain pain medicines.  Follow your health care provider's instructions regarding eating and drinking before the test.  Take your medicines as directed at regular times with water unless instructed otherwise. Exceptions may include:  If you have diabetes, ask how you are to take your insulin or pills. It is common to adjust insulin dosing the morning of the test.  If you are taking beta-blocker medicines, it is important to talk to your health care provider about these medicines well before the date of your test. Taking beta-blocker medicines may interfere with the test. In some cases, these medicines need to be changed or stopped 24 hours or more before the test.  If you wear a nitroglycerin patch, it may need to be removed prior to the test. Ask your health care provider if the patch should be removed before the test.  If you use an inhaler for any breathing condition, bring it with you to the test.  If you are an outpatient, bring a snack so you can eat right after the stress phase of the test.  Do not smoke for 4 hours prior to the test or as directed by your health care provider.  Do not apply lotions, powders, creams, or oils on your chest prior to the test.  Wear loose-fitting clothes and comfortable shoes for the test. This test involves walking on a treadmill. What happens during the procedure?  Multiple patches (electrodes) will be put on your chest. If needed, small areas of your chest may have to be shaved to get better contact with the electrodes. Once the  electrodes are attached to your body, multiple wires will be attached to the electrodes and your heart rate will be monitored.  Your heart will be monitored both at rest and while exercising.  You will walk on a treadmill. The treadmill will be started at a slow pace. The treadmill speed and incline will gradually be increased to raise your heart rate. What happens after the procedure?  Your heart rate and blood pressure will be monitored after the test.  You may return to your normal schedule including diet, activities, and medicines, unless your health care provider tells you otherwise. This information is not intended to replace advice given to you by your health care provider. Make sure you discuss any questions you have with your health care provider. Document Released: 01/11/2000 Document Revised: 06/21/2015 Document Reviewed: 09/20/2012 Elsevier Interactive Patient Education  2017 [employer].   If you need a refill on your cardiac medications before your next appointment, please call your pharmacy.

## 2016-03-18 ENCOUNTER — Ambulatory Visit (HOSPITAL_COMMUNITY): Payer: Medicare Other | Attending: Cardiovascular Disease

## 2016-03-18 DIAGNOSIS — R079 Chest pain, unspecified: Secondary | ICD-10-CM | POA: Insufficient documentation

## 2016-03-18 DIAGNOSIS — R5383 Other fatigue: Secondary | ICD-10-CM | POA: Diagnosis not present

## 2016-03-18 DIAGNOSIS — R0609 Other forms of dyspnea: Secondary | ICD-10-CM | POA: Diagnosis not present

## 2016-03-18 DIAGNOSIS — I5042 Chronic combined systolic (congestive) and diastolic (congestive) heart failure: Secondary | ICD-10-CM | POA: Diagnosis present

## 2016-03-18 DIAGNOSIS — Z8249 Family history of ischemic heart disease and other diseases of the circulatory system: Secondary | ICD-10-CM | POA: Insufficient documentation

## 2016-03-18 LAB — MYOCARDIAL PERFUSION IMAGING
CHL CUP NUCLEAR SDS: 2
CSEPED: 6 min
CSEPHR: 95 %
CSEPPHR: 139 {beats}/min
Estimated workload: 7 METS
Exercise duration (sec): 0 s
LV dias vol: 78 mL (ref 46–106)
LVSYSVOL: 33 mL
MPHR: 146 {beats}/min
RATE: 0.29
Rest HR: 61 {beats}/min
SRS: 6
SSS: 6
TID: 1.04

## 2016-03-18 MED ORDER — TECHNETIUM TC 99M TETROFOSMIN IV KIT
10.9000 | PACK | Freq: Once | INTRAVENOUS | Status: AC | PRN
  Administered 2016-03-18: 10.9 via INTRAVENOUS
  Filled 2016-03-18: qty 11

## 2016-03-18 MED ORDER — TECHNETIUM TC 99M TETROFOSMIN IV KIT
32.6000 | PACK | Freq: Once | INTRAVENOUS | Status: AC | PRN
  Administered 2016-03-18: 32.6 via INTRAVENOUS
  Filled 2016-03-18: qty 33

## 2016-05-14 ENCOUNTER — Encounter (INDEPENDENT_AMBULATORY_CARE_PROVIDER_SITE_OTHER): Payer: Self-pay

## 2016-05-14 ENCOUNTER — Ambulatory Visit (INDEPENDENT_AMBULATORY_CARE_PROVIDER_SITE_OTHER): Payer: Medicare Other | Admitting: Interventional Cardiology

## 2016-05-14 ENCOUNTER — Encounter: Payer: Self-pay | Admitting: Interventional Cardiology

## 2016-05-14 VITALS — BP 116/76 | HR 58 | Ht 64.0 in | Wt 152.2 lb

## 2016-05-14 DIAGNOSIS — I5022 Chronic systolic (congestive) heart failure: Secondary | ICD-10-CM | POA: Diagnosis not present

## 2016-05-14 DIAGNOSIS — I370 Nonrheumatic pulmonary valve stenosis: Secondary | ICD-10-CM

## 2016-05-14 DIAGNOSIS — I071 Rheumatic tricuspid insufficiency: Secondary | ICD-10-CM | POA: Diagnosis not present

## 2016-05-14 DIAGNOSIS — I272 Pulmonary hypertension, unspecified: Secondary | ICD-10-CM

## 2016-05-14 HISTORY — DX: Chronic systolic (congestive) heart failure: I50.22

## 2016-05-14 NOTE — Patient Instructions (Signed)
Medication Instructions:  1) We will discontinue Carvedilol.  Take one tablet once daily for a week and then discontinue.  Labwork: None  Testing/Procedures: None  Follow-Up: Your physician wants you to follow-up in: 1 year with Dr. Tamala Julian.  You will receive a reminder letter in the mail two months in advance. If you don't receive a letter, please call our office to schedule the follow-up appointment.   Any Other Special Instructions Will Be Listed Below (If Applicable).     If you need a refill on your cardiac medications before your next appointment, please call your pharmacy.

## 2016-05-14 NOTE — Progress Notes (Signed)
Cardiology Office Note    Date:  05/14/2016   ID:  Grace Fletcher, DOB 1940/12/27, MRN [id number]  PCP:  Chauncey Cruel, MD  Cardiologist: Sinclair Grooms, MD   Chief Complaint  Patient presents with  . Congestive Heart Failure    History of Present Illness:  Grace Fletcher is a 76 y.o. female who presents for pulmonic stenosis, prior percutaneous ASD repair, mild dilation of the right ventricle,Relatively newly diagnosed systolic dysfunction , and history of chest pain.  The patient is doing okay. She denies complaints. An echocardiogram done late last year to evaluate pulmonic valve and right ventricular function suggested decreased LVEF in the 30-40 range. This led to initiation of guide line mandated heart failure therapy. She was subsequently seen and a myocardial perfusion study was ordered by Nell Range, PAC to assess for etiology of decreased LV function. The nuclear study was low risk and demonstrated an EF of 57%. Further medication titration was not carried out. She is here today with no complaints.  Past Medical History:  Diagnosis Date  . Anxiety 05/26/2013  . Breast cancer ([place])   . Breast cancer, left breast ([place]) 04/23/2011  . Chest discomfort   . Cyst of pancreas   . Depression 05/26/2013  . Moderate tricuspid regurgitation by prior echocardiogram 05/13/2010  . Pulmonary hypertension ([place]) 05/13/2010  . Pulmonary stenosis   . Pulmonary stenosis 05/13/2010   Study Conclusions  - Left ventricle: The cavity size was normal. Systolic function was mildly reduced. The estimated ejection fraction was in the range of 45% to 50%. There is hypokinesis of the anteroseptal myocardium. Doppler parameters are consistent with abnormal left ventricular relaxation (grade 1 diastolic dysfunction). - Right ventricle: The cavity size was mildly dilated. Wall thickness was normal. Systolic function was mildly reduced. - Right atrium: The atrium was mildly dilated.  - Atrial septum: No atrial level shunt, at baseline with Doppler analysis. Prior ASD repair, CardioSeal 2004. - Pulmonic valve: Transvalvular velocity was increased, due to mild degree of stenosis. Peak velocity: 224cm/s (S). - Pulmonary arteries: Systolic pressure was mildly increased.    . 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 BREAST LUMPECTOMY     left  . CARPAL TUNNEL RELEASE    . CESAREAN SECTION    . SENTINEL LYMPH NODE BIOPSY      Current Medications: Outpatient Medications Prior to Visit  Medication Sig Dispense Refill  . aspirin 81 MG tablet Take 81 mg by mouth daily.      . carvedilol (COREG) 3.125 MG tablet Take 3.125 mg by mouth 2 (two) times daily with a meal.    . Cholecalciferol (VITAMIN D) 1000 UNITS capsule Take 1,000 Units by mouth daily.    . citalopram (CELEXA) 40 MG tablet TAKE 1 TABLET (40 MG TOTAL) BY MOUTH DAILY. 30 tablet 3  . Docusate Calcium (STOOL SOFTENER PO) Take 1 capsule by mouth daily.     . Multiple Vitamin (MULTIVITAMIN) capsule Take 1 capsule by mouth daily.      Marland Kitchen losartan (COZAAR) 25 MG tablet Take 1 tablet (25 mg total) by mouth daily. 90 tablet 3   No facility-administered medications prior to visit.      Allergies:   Patient has no known allergies.   Social History   Social History  . Marital status: Married    Spouse name:  N/A  . Number of children: N/A  . Years of education: N/A   Social History Main Topics  . Smoking status: Former Research scientist (life sciences)  . Smokeless tobacco: Never Used  . Alcohol use No  . Drug use: No  . Sexual activity: Yes    Birth control/ protection: Post-menopausal   Other Topics Concern  . None   Social History Narrative  . None     Family History:  The patient's family history includes Arthritis in her mother; Dementia in her mother; Healthy in her sister; Heart attack in her  father; Heart disease in her father.   ROS:   Please see the history of present illness.    No complaints  All other systems reviewed and are negative.   PHYSICAL EXAM:   VS:  BP 116/76 (BP Location: Left Arm)   Pulse (!) 58   Ht 5\' 4"  (1.626 m)   Wt 152 lb 3.2 oz (69 kg)   BMI 26.13 kg/m    GEN: Well nourished, well developed, in no acute distress  HEENT: normal  Neck: no JVD, carotid bruits, or masses Cardiac: RRR; no murmurs, rubs, or gallops,no edema  Respiratory:  clear to auscultation bilaterally, normal work of breathing GI: soft, nontender, nondistended, + BS MS: no deformity or atrophy  Skin: warm and dry, no rash Neuro:  Alert and Oriented x 3, Strength and sensation are intact Psych: euthymic mood, full affect  Wt Readings from Last 3 Encounters:  05/14/16 152 lb 3.2 oz (69 kg)  03/18/16 153 lb (69.4 kg)  03/13/16 153 lb 6.4 oz (69.6 kg)      Studies/Labs Reviewed:   EKG:  EKG  Not repeated  Recent Labs: 05/22/2015: ALT 10; HGB 14.2; Platelets 183 03/13/2016: BUN 14; Creatinine, Ser 0.66; Potassium 4.3; Sodium 141   Lipid Panel No results found for: CHOL, TRIG, HDL, CHOLHDL, VLDL, LDLCALC, LDLDIRECT  Additional studies/ records that were reviewed today include:  Myocardial perfusion study February 2018: Study Highlights     The left ventricular ejection fraction is normal (55-65%).  Nuclear stress EF: 57%.  There was no ST segment deviation noted during stress.  Blood pressure demonstrated a normal response to exercise.  This is a low risk study.   1. EF 57% with normal wall motion.  2. Fixed small, mild mid inferoseptal perfusion defect. Given normal wall motion, I suspect that this may represent attenuation.  No ischemia.  3. Low risk study.     Echocardiogram performed 10/08/15: ------------------------------------------------------------------- Study Conclusions  - Left ventricle: Very poor acoustic windows limit study Overall   LVEF  appears mild to moderately depressed Consider limited echo   with contrast to evaluate function further. The cavity size was   normal. Wall thickness was normal. Systolic function was   moderately reduced. The estimated ejection fraction was in the   range of 35% to 40%. - Aortic valve: There was trivial regurgitation. - Right ventricle: The cavity size was mildly dilated. Wall   thickness was normal. Systolic function was mildly reduced. - Right atrium: The atrium was mildly dilated. - Pulmonic valve: Pulmonic valve is not well visualized Peak   gradient through the valve is 15 mm Hg. no significant change   from previous echo report.  ASSESSMENT:    1. Pulmonary hypertension ([place])   2. Nonrheumatic pulmonary valve stenosis   3. Moderate tricuspid regurgitation by prior echocardiogram   4. Chronic systolic heart failure (HCC)      PLAN:  In  order of problems listed above:  1. There is mild right heart dilatation on echocardiogram. No evidence of significant pulmonic valve dysfunction. Surprisingly the echocardiogram demonstrated LV dysfunction. Please see below. 2. No evidence of pulmonic stenosis is noted. 3. Not addressed 4. Systolic dysfunction may not actually be a clinical diagnosis. EF noted to be 57% by perfusion imaging recently. Very trivial doses of beta blocker and ARB therapy were started with Isaac Laud and side effects. The improvement in LV function from 35% to 57% may represent an interpretation hour of the echo or improvement in LV function based on low-dose heart failure therapy. I doubt the latter and suspect that the echo interpretation was a Miss Reid.  I will personally review the echocardiogram. I will wean the heart failure therapy as the patient really has no reason for this therapy if LV function is normal. We will DC carvedilol. We will subsequently DC the low after I interpret the echo function.  Personally review the echocardiogram. Cross-sectional view suggests  an EF greater than 50%. Long axis views a more difficult to interpret and I can understand possibly reading EF less than 50%. After personally reviewing the data I am not convinced that the patient has systolic dysfunction and will take her off the medications that we started.  Medication Adjustments/Labs and Tests Ordered: Current medicines are reviewed at length with the patient today.  Concerns regarding medicines are outlined above.  Medication changes, Labs and Tests ordered today are listed in the Patient Instructions below. There are no Patient Instructions on file for this visit.   Signed, Sinclair Grooms, MD  05/14/2016 10:38 AM    [facility] [address], [place], [place]  [address] Phone: [phone]; Fax: [phone]

## 2016-12-22 ENCOUNTER — Other Ambulatory Visit: Payer: Self-pay | Admitting: *Deleted

## 2016-12-23 ENCOUNTER — Other Ambulatory Visit: Payer: Self-pay

## 2017-01-07 ENCOUNTER — Other Ambulatory Visit: Payer: Self-pay | Admitting: *Deleted

## 2017-01-07 MED ORDER — CITALOPRAM HYDROBROMIDE 40 MG PO TABS: ORAL_TABLET | ORAL | 3 refills | Status: DC

## 2017-01-29 ENCOUNTER — Other Ambulatory Visit: Payer: Self-pay | Admitting: Interventional Cardiology

## 2017-02-09 ENCOUNTER — Telehealth: Payer: Self-pay | Admitting: *Deleted

## 2017-02-09 NOTE — Telephone Encounter (Signed)
   [facility] Pre-operative Risk Assessment    Request for surgical clearance:  1. What type of surgery is being performed? Dental Cleaning or Procedure  2. When is this surgery scheduled? Pending Clearance  3. Are there any medications that need to be held prior to surgery and how long? Advise for prophylactic antibiotic premedication before dental treatments and medication treatment after dental procedure  4. Practice name and name of physician performing surgery? [facility]  5. What is your office phone and fax number? P# [phone], F# [phone]  6. Anesthesia type (None, local, MAC, general) ? Not Mentioned   Grace Fletcher 02/09/2017, 2:24 PM  _________________________________________________________________   (provider comments below)

## 2017-02-10 NOTE — Telephone Encounter (Signed)
Pre op pool contacted to address 

## 2017-02-10 NOTE — Telephone Encounter (Signed)
Follow up    Patient is being seen 02/11/17 for a broken filling, needs response back today

## 2017-02-11 NOTE — Telephone Encounter (Signed)
S/w pt states needs lots of dental work, pt needs work up for pre-op clearance. Made sooner appt with APP and kept upcoming doctors appt in system.  

## 2017-02-11 NOTE — Telephone Encounter (Signed)
Follow up     Patient in office , they need call back asap on dental clearance

## 2017-02-11 NOTE — Telephone Encounter (Signed)
Notified [facility] that pt will likely need appt for clearance since appt was nearly a year ago

## 2017-02-19 ENCOUNTER — Encounter: Payer: Self-pay | Admitting: Cardiology

## 2017-02-19 ENCOUNTER — Ambulatory Visit (INDEPENDENT_AMBULATORY_CARE_PROVIDER_SITE_OTHER): Payer: Medicare Other | Admitting: Cardiology

## 2017-02-19 VITALS — BP 110/76 | HR 64 | Ht 64.0 in | Wt 155.0 lb

## 2017-02-19 DIAGNOSIS — I370 Nonrheumatic pulmonary valve stenosis: Secondary | ICD-10-CM | POA: Diagnosis not present

## 2017-02-19 DIAGNOSIS — I5022 Chronic systolic (congestive) heart failure: Secondary | ICD-10-CM

## 2017-02-19 DIAGNOSIS — Z8774 Personal history of (corrected) congenital malformations of heart and circulatory system: Secondary | ICD-10-CM

## 2017-02-19 DIAGNOSIS — Z01818 Encounter for other preprocedural examination: Secondary | ICD-10-CM

## 2017-02-19 MED ORDER — AMOXICILLIN 500 MG PO CAPS: ORAL_CAPSULE | ORAL | 1 refills | Status: DC

## 2017-02-19 NOTE — Patient Instructions (Signed)
Medication Instructions:  Your physician recommends that you continue on your current medications as directed. Please refer to the Current Medication list given to you today. Take Amoxicillin as recommended prior to dental procedure   Labwork: None Ordered   Testing/Procedures: None Ordered   Follow-Up: Keep your follow-up appointment with Dr. Tamala Julian on 4/1  If you need a refill on your cardiac medications before your next appointment, please call your pharmacy.   Thank you for choosing [facility]! Christen Bame, RN [phone]

## 2017-02-19 NOTE — Progress Notes (Signed)
Cardiology Office Note   Date:  02/19/2017   ID:  Grace Fletcher, DOB 08/11/1940, MRN [id number]  PCP:  Chauncey Cruel, MD  Cardiologist:  Dr. Tamala Julian     Chief Complaint  Patient presents with  . Pre-op Exam      History of Present Illness: Grace Fletcher is a 77 y.o. female who presents for pre-op clearance for dental filling and crown.  She has hx of pulmonic stenosis, prior percutaneous ASD repair, mild dilation of the right ventricle,Relatively newly diagnosed systolic dysfunction, and history of chest pain.  Echocardiogram done 2017 to evaluate pulmonic valve and right ventricular function suggested decreased LVEF in the 30-40 range. This led to initiation of guide line mandated heart failure therapy. She was subsequently seen and a myocardial perfusion study was ordered by Nell Range, PAC to assess for etiology of decreased LV function. The nuclear study was low risk and demonstrated an EF of 57%.    Today she denies chest pain or SOB.  No lightheadedness or dizziness.  Will recommend ABX for pulmonic stenosis.  she has not exercised as often recently.      Past Medical History:  Diagnosis Date  . Anxiety 05/26/2013  . Breast cancer ([place])   . Breast cancer, left breast ([place]) 04/23/2011  . Chest discomfort   . Cyst of pancreas   . Depression 05/26/2013  . Moderate tricuspid regurgitation by prior echocardiogram 05/13/2010  . Pulmonary hypertension ([place]) 05/13/2010  . Pulmonary stenosis   . Pulmonary stenosis 05/13/2010   Study Conclusions  - Left ventricle: The cavity size was normal. Systolic function was mildly reduced. The estimated ejection fraction was in the range of 45% to 50%. There is hypokinesis of the anteroseptal myocardium. Doppler parameters are consistent with abnormal left ventricular relaxation (grade 1 diastolic dysfunction). - Right ventricle: The cavity size was mildly dilated. Wall thickness was normal. Systolic function was mildly  reduced. - Right atrium: The atrium was mildly dilated. - Atrial septum: No atrial level shunt, at baseline with Doppler analysis. Prior ASD repair, CardioSeal 2004. - Pulmonic valve: Transvalvular velocity was increased, due to mild degree of stenosis. Peak velocity: 224cm/s (S). - Pulmonary arteries: Systolic pressure was mildly increased.    . 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 BREAST LUMPECTOMY     left  . CARPAL TUNNEL RELEASE    . CESAREAN SECTION    . SENTINEL LYMPH NODE BIOPSY       Current Outpatient Medications  Medication Sig Dispense Refill  . aspirin 81 MG tablet Take 81 mg by mouth daily.      . Cholecalciferol (VITAMIN D) 1000 UNITS capsule Take 1,000 Units by mouth daily.    . citalopram (CELEXA) 40 MG tablet TAKE 1 TABLET (40 MG TOTAL) BY MOUTH DAILY. 30 tablet 3  . Docusate Calcium (STOOL SOFTENER PO) Take 1 capsule by mouth daily.     Marland Kitchen losartan (COZAAR) 25 MG tablet Take 1 tablet (25 mg total) by mouth daily. Please make yearly appt with Dr. Tamala Julian for April before anymore refills. 1st attempt 90 tablet 0  . Multiple Vitamin (MULTIVITAMIN) capsule Take 1 capsule by mouth daily.      Marland Kitchen amoxicillin (AMOXIL) 500 MG capsule Take 2000 mg (4 capsules) 30-60 min before dental procedure 4 capsule 1   No current facility-administered medications for this visit.  Allergies:   Patient has no known allergies.    Social History:  The patient  reports that she has quit smoking. she has never used smokeless tobacco. She reports that she does not drink alcohol or use drugs.   Family History:  The patient's family history includes Arthritis in her mother; Dementia in her mother; Healthy in her sister; Heart attack in her father; Heart disease in her father.    ROS:  General:no colds or fevers, no weight changes Skin:no rashes or  ulcers HEENT:no blurred vision, no congestion, broken too CV:see HPI PUL:see HPI GI:no diarrhea constipation or melena, no indigestion GU:no hematuria, no dysuria MS:no joint pain, no claudication Neuro:no syncope, no lightheadedness Endo:no diabetes, no thyroid disease  Wt Readings from Last 3 Encounters:  02/19/17 155 lb (70.3 kg)  05/14/16 152 lb 3.2 oz (69 kg)  03/18/16 153 lb (69.4 kg)     PHYSICAL EXAM: VS:  BP 110/76   Pulse 64   Ht 5\' 4"  (1.626 m)   Wt 155 lb (70.3 kg)   SpO2 98%   BMI 26.61 kg/m  , BMI Body mass index is 26.61 kg/m. General:Pleasant affect, NAD Skin:Warm and dry, brisk capillary refill HEENT:normocephalic, sclera clear, mucus membranes moist Neck:supple, no JVD, no bruits  Heart:S1S2 RRR with 2/3 systolic murmur, no gallup, rub or click Lungs:clear without rales, rhonchi, or wheezes UXL:KGMW, non tender, + BS, do not palpate liver spleen or masses Ext:no lower ext edema, 2+ pedal pulses, 2+ radial pulses Neuro:alert and oriented X 3, MAE, follows commands, + facial symmetry    EKG:  EKG is ordered today. The ekg ordered today demonstrates SR and no changes from previous.   Recent Labs: 03/13/2016: BUN 14; Creatinine, Ser 0.66; Potassium 4.3; Sodium 141    Lipid Panel No results found for: CHOL, TRIG, HDL, CHOLHDL, VLDL, LDLCALC, LDLDIRECT     Other studies Reviewed: Additional studies/ records that were reviewed today include: . ECHO 10/08/15 Study Conclusions  - Left ventricle: Very poor acoustic windows limit study Overall   LVEF appears mild to moderately depressed Consider limited echo   with contrast to evaluate function further. The cavity size was   normal. Wall thickness was normal. Systolic function was   moderately reduced. The estimated ejection fraction was in the   range of 35% to 40%. - Aortic valve: There was trivial regurgitation. - Right ventricle: The cavity size was mildly dilated. Wall   thickness was normal.  Systolic function was mildly reduced. - Right atrium: The atrium was mildly dilated. - Pulmonic valve: Pulmonic valve is not well visualized Peak   gradient through the valve is 15 mm Hg. no significant change   from previous echo report.  nuc study 03/18/16 Study Highlights     The left ventricular ejection fraction is normal (55-65%).  Nuclear stress EF: 57%.  There was no ST segment deviation noted during stress.  Blood pressure demonstrated a normal response to exercise.  This is a low risk study.   1. EF 57% with normal wall motion.  2. Fixed small, mild mid inferoseptal perfusion defect. Given normal wall motion, I suspect that this may represent attenuation.  No ischemia.  3. Low risk study.      ASSESSMENT AND PLAN:  1.  Pre-op for dental fractured filling.  Will treat with ABX prior to procedure but low risk   RCRI index is 0.9% papers signed and she took with her.   2.  Non rheumatic pulmonary valve  stenosis--occ SOB but brief, no edema today  3.  Chronic systolic HF - euvolemic today  EF on nuc 57 % on Echo was lower the year prior.  She will follow up with Dr. Tamala Julian in April.   4.  Hx of ASD repair in [place].     Current medicines are reviewed with the patient today.  The patient Has no concerns regarding medicines.  The following changes have been made:  See above Labs/ tests ordered today include:see above  Disposition:   FU:  see above  Signed, Cecilie Kicks, NP  02/19/2017 3:05 PM    [facility] [address], [place], [place], [place] Phone: [phone]; Fax: [phone]

## 2017-02-26 NOTE — Addendum Note (Signed)
Addended by: De Burrs on: 02/26/2017 10:36 AM   Modules accepted: Orders

## 2017-03-03 ENCOUNTER — Ambulatory Visit (INDEPENDENT_AMBULATORY_CARE_PROVIDER_SITE_OTHER): Payer: Medicare Other | Admitting: Family Medicine

## 2017-03-03 ENCOUNTER — Encounter: Payer: Self-pay | Admitting: Family Medicine

## 2017-03-03 VITALS — BP 96/64 | HR 66 | Temp 97.4°F | Ht 63.0 in | Wt 154.8 lb

## 2017-03-03 DIAGNOSIS — I5022 Chronic systolic (congestive) heart failure: Secondary | ICD-10-CM | POA: Diagnosis not present

## 2017-03-03 DIAGNOSIS — F419 Anxiety disorder, unspecified: Secondary | ICD-10-CM | POA: Diagnosis not present

## 2017-03-03 DIAGNOSIS — I370 Nonrheumatic pulmonary valve stenosis: Secondary | ICD-10-CM | POA: Diagnosis not present

## 2017-03-03 DIAGNOSIS — F32A Depression, unspecified: Secondary | ICD-10-CM

## 2017-03-03 DIAGNOSIS — I272 Pulmonary hypertension, unspecified: Secondary | ICD-10-CM | POA: Diagnosis not present

## 2017-03-03 DIAGNOSIS — F329 Major depressive disorder, single episode, unspecified: Secondary | ICD-10-CM

## 2017-03-03 LAB — COMPREHENSIVE METABOLIC PANEL
ALT: 10 U/L (ref 0–35)
AST: 17 U/L (ref 0–37)
Albumin: 4 g/dL (ref 3.5–5.2)
Alkaline Phosphatase: 59 U/L (ref 39–117)
BUN: 13 mg/dL (ref 6–23)
CHLORIDE: 106 meq/L (ref 96–112)
CO2: 28 meq/L (ref 19–32)
CREATININE: 0.7 mg/dL (ref 0.40–1.20)
Calcium: 9.4 mg/dL (ref 8.4–10.5)
GFR: 86.7 mL/min (ref >60.00)
GLUCOSE: 84 mg/dL (ref 70–99)
Potassium: 4.4 mEq/L (ref 3.5–5.1)
SODIUM: 141 meq/L (ref 135–145)
Total Bilirubin: 0.7 mg/dL (ref 0.2–1.2)
Total Protein: 6.4 g/dL (ref 6.0–8.3)

## 2017-03-03 LAB — CBC WITH DIFFERENTIAL/PLATELET
BASOS PCT: 0.5 % (ref 0.0–3.0)
Basophils Absolute: 0 10*3/uL (ref 0.0–0.1)
EOS ABS: 0.1 10*3/uL (ref 0.0–0.7)
Eosinophils Relative: 2.8 % (ref 0.0–5.0)
HCT: 42.7 % (ref 36.0–46.0)
HEMOGLOBIN: 14.3 g/dL (ref 12.0–15.0)
Lymphocytes Relative: 41.3 % (ref 12.0–46.0)
Lymphs Abs: 1.9 10*3/uL (ref 0.7–4.0)
MCHC: 33.4 g/dL (ref 30.0–36.0)
MCV: 95.4 fl (ref 78.0–100.0)
MONO ABS: 0.4 10*3/uL (ref 0.1–1.0)
Monocytes Relative: 8.4 % (ref 3.0–12.0)
NEUTROS PCT: 47 % (ref 43.0–77.0)
Neutro Abs: 2.2 10*3/uL (ref 1.4–7.7)
Platelets: 196 10*3/uL (ref 150.0–400.0)
RBC: 4.47 Mil/uL (ref 3.87–5.11)
RDW: 13.2 % (ref 11.5–15.5)
WBC: 4.7 10*3/uL (ref 4.0–10.5)

## 2017-03-03 LAB — TSH: TSH: 2.5 u[IU]/mL (ref 0.35–4.50)

## 2017-03-03 MED ORDER — CITALOPRAM HYDROBROMIDE 40 MG PO TABS: ORAL_TABLET | ORAL | 1 refills | Status: DC

## 2017-03-03 NOTE — Patient Instructions (Signed)
Start daily over the counter B12. Continue vit d supplement.  I have refilled your Celexa.  Hydrate (water).  We will call you with lab results and discuss plan further at that time.    It was a pleasure to meet you.   Try to schedule a physical (if insurance allows) within next 3 months (fast for 6-9 hours prior water and meds ok).

## 2017-03-03 NOTE — Progress Notes (Signed)
Patient ID: Kynslee Baham Kertesz, female  DOB: 1940-09-22, 77 y.o.   MRN: [id number] Patient Care Team    Relationship Specialty Notifications Start End  Ma Hillock, DO PCP - General Family Medicine  03/03/17   Magrinat, Virgie Dad, MD Consulting Physician Oncology  03/03/17   Belva Crome, MD Consulting Physician Cardiology  03/03/17   Renelda Loma, OD  Optometry  03/03/17   Dian Queen, MD Consulting Physician Obstetrics and Gynecology  03/03/17     Chief Complaint  Patient presents with  . Establish Care    pt Chronic Medical Conditions    Subjective:  Tiyanna Larcom Bartelson is a 77 y.o.  female present for new patient establishment. All past medical history, surgical history, allergies, family history, immunizations, medications and social history were obtained in the electronic medical record today. All recent labs, ED visits and hospitalizations within the last year were reviewed.  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 She does have reports of increased fatigue today. Her blood pressures are lower than her normal per EMR review and patient's report. She denies any dizziness, chest pain, shortness of breath or syncope. She reports compliance losartan 25 mg daily. She is established with cardiology.  Depression: Patient reports she was started on Celexa many years ago when she was diagnosed with breast cancer. She had discontinued until her son had become incarcerated. She states that with follow-up for her to handle and she restarted the Celexa at that time a few years ago. Celexa 40 mg dose works well for her, and she was like to continue medication.   Depression screen PHQ 2/9 03/03/2017  Decreased Interest 0  Down, Depressed, Hopeless 1  PHQ - 2 Score 1  Altered sleeping 0  Tired, decreased energy 1  Change in appetite 1    Feeling bad or failure about yourself  0  Trouble concentrating 0  Moving slowly or fidgety/restless 0  Suicidal thoughts 0  PHQ-9 Score 3   GAD 7 : Generalized Anxiety Score 03/03/2017  Nervous, Anxious, on Edge 0  Control/stop worrying 0  Worry too much - different things 0  Trouble relaxing 0  Restless 0  Easily annoyed or irritable 0  Afraid - awful might happen 0  Total GAD 7 Score 0      Fall Risk  03/03/2017  Falls in the past year? No     Immunization History  Administered Date(s) Administered  . DTaP 04/13/2015  . Influenza, High Dose Seasonal PF 11/27/2016    No exam data present  Past Medical History:  Diagnosis Date  . Anxiety 05/26/2013  . Arthritis   . Breast cancer, left breast ([place]) 2006   Lumpectomy, radiation and tamoxifen for 5 years.  . Colon polyp   . Cyst of pancreas   . Depression 05/26/2013  . Migraines   . Moderate tricuspid regurgitation by prior echocardiogram 05/13/2010  . Pulmonary hypertension ([place]) 05/13/2010  . Pulmonary stenosis 05/13/2010   Study Conclusions  - Left ventricle: The cavity size was normal. Systolic function was mildly reduced. The estimated ejection fraction was in the range of 45% to 50%. There is hypokinesis of the anteroseptal myocardium. Doppler parameters are consistent with abnormal left ventricular relaxation (grade 1 diastolic dysfunction). - Right ventricle: The cavity size was mildly dilated. Wall thickness was normal. Systolic function was mildly reduced. - Right atrium: The atrium was  mildly dilated. - Atrial septum: No atrial level shunt, at baseline with Doppler analysis. Prior ASD repair, CardioSeal 2004. - Pulmonic valve: Transvalvular velocity was increased, due to mild degree of stenosis. Peak velocity: 224cm/s (S). - Pulmonary arteries: Systolic pressure was mildly increased.    . 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 BREAST LUMPECTOMY     left breast cancer, status post lumpectomy, radiation and tamoxifen 5 years  . CARPAL TUNNEL RELEASE    . CESAREAN SECTION     x4  . SENTINEL LYMPH NODE BIOPSY     Family History  Problem Relation Age of Onset  . Dementia Mother   . Arthritis Mother   . Heart attack Father   . Heart disease Father   . Hypertension Sister   . Heart attack Paternal Grandmother   . Heart disease Paternal Grandmother    Social History   Socioeconomic History  . Marital status: Married    Spouse name: Not on file  . Number of children: 4  . Years of education: Not on file  . Highest education level: Not on file  Social Needs  . Financial resource strain: Not on file  . Food insecurity - worry: Not on file  . Food insecurity - inability: Not on file  . Transportation needs - medical: Not on file  . Transportation needs - non-medical: Not on file  Occupational History  . Occupation: retired  Tobacco Use  . Smoking status: Former Research scientist (life sciences)  . Smokeless tobacco: Never Used  Substance and Sexual Activity  . Alcohol use: No  . Drug use: No  . Sexual activity: Yes    Partners: Male    Birth control/protection: Post-menopausal  Other Topics Concern  . Not on file  Social History Narrative   Married. 4 children.    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   Takes a daily vitamin.   Smoke alarm in the home.   Wears her seatbelt.   Feels safe in her relationships.   Allergies as of 03/03/2017   No Known Allergies     Medication List        Accurate as of 03/03/17  6:02 PM. Always use your most recent med list.          amoxicillin 500 MG capsule Commonly known as:  AMOXIL Take 2000 mg (4 capsules) 30-60 min before dental procedure   aspirin 81 MG tablet Take 81 mg by mouth daily.   citalopram 40 MG tablet Commonly known as:  CELEXA TAKE 1 TABLET (40 MG TOTAL) BY MOUTH DAILY.   losartan 25  MG tablet Commonly known as:  COZAAR Take 1 tablet (25 mg total) by mouth daily. Please make yearly appt with Dr. Tamala Julian for April before anymore refills. 1st attempt   multivitamin capsule Take 1 capsule by mouth daily.   STOOL SOFTENER PO Take 1 capsule by mouth daily.   Vitamin D 1000 units capsule Take 1,000 Units by mouth daily.      All past medical history, surgical history, allergies, family history, immunizations andmedications were updated in the EMR today and reviewed under the history and medication portions of their EMR.    Recent Results (from the past 2160 hour(s))  CBC w/Diff     Status: None   Collection Time: 03/03/17  9:37 AM  Result Value Ref Range   WBC 4.7  4.0 - 10.5 K/uL   RBC 4.47 3.87 - 5.11 Mil/uL   Hemoglobin 14.3 12.0 - 15.0 g/dL   HCT 42.7 36.0 - 46.0 %   MCV 95.4 78.0 - 100.0 fl   MCHC 33.4 30.0 - 36.0 g/dL   RDW 13.2 11.5 - 15.5 %   Platelets 196.0 150.0 - 400.0 K/uL   Neutrophils Relative % 47.0 43.0 - 77.0 %   Lymphocytes Relative 41.3 12.0 - 46.0 %   Monocytes Relative 8.4 3.0 - 12.0 %   Eosinophils Relative 2.8 0.0 - 5.0 %   Basophils Relative 0.5 0.0 - 3.0 %   Neutro Abs 2.2 1.4 - 7.7 K/uL   Lymphs Abs 1.9 0.7 - 4.0 K/uL   Monocytes Absolute 0.4 0.1 - 1.0 K/uL   Eosinophils Absolute 0.1 0.0 - 0.7 K/uL   Basophils Absolute 0.0 0.0 - 0.1 K/uL  Comp Met (CMET)     Status: None   Collection Time: 03/03/17  9:37 AM  Result Value Ref Range   Sodium 141 135 - 145 mEq/L   Potassium 4.4 3.5 - 5.1 mEq/L   Chloride 106 96 - 112 mEq/L   CO2 28 19 - 32 mEq/L   Glucose, Bld 84 70 - 99 mg/dL   BUN 13 6 - 23 mg/dL   Creatinine, Ser 0.70 0.40 - 1.20 mg/dL   Total Bilirubin 0.7 0.2 - 1.2 mg/dL   Alkaline Phosphatase 59 39 - 117 U/L   AST 17 0 - 37 U/L   ALT 10 0 - 35 U/L   Total Protein 6.4 6.0 - 8.3 g/dL   Albumin 4.0 3.5 - 5.2 g/dL   Calcium 9.4 8.4 - 10.5 mg/dL   GFR 86.70 >60.00 mL/min  TSH     Status: None   Collection Time: 03/03/17   9:37 AM  Result Value Ref Range   TSH 2.50 0.35 - 4.50 uIU/mL    ROS: 14 pt review of systems performed and negative (unless mentioned in an HPI)  Objective: BP 96/64 (BP Location: Left Arm, Patient Position: Sitting, Cuff Size: Normal)   Pulse 66   Temp (!) 97.4 F (36.3 C) (Oral)   Ht 5' 3"  (1.6 m)   Wt 154 lb 12.8 oz (70.2 kg)   SpO2 95%   BMI 27.42 kg/m  Gen: Afebrile. No acute distress. Nontoxic in appearance, well-developed, well-nourished,  very pleasant Caucasian female. HENT: AT. [place].MMM, no oral lesions, adequate dentition. No Cough on exam, no hoarseness on exam. Eyes:Pupils Equal Round Reactive to light, Extraocular movements intact,  Conjunctiva without redness, discharge or icterus. Neck/lymp/endocrine: Supple, no lymphadenopathy, no thyromegaly CV: RRR 2/6 systolic murmur, no edema, +2/4 P posterior tibialis pulses. No carotid bruits. No JVD. Chest: CTAB, no wheeze, rhonchi or crackles.  Abd: Soft.  NTND. BS present.  Skin:  Warm and well-perfused. Skin intact. Neuro/Msk:  Normal gait. PERLA. EOMi. Alert. Oriented x3.   Psych: Normal affect, dress and demeanor. Normal speech. Normal thought content and judgment.   Assessment/plan: Phoua Hoadley Hobin is a 77 y.o. female present for establish care. Pulmonary hypertension (HCC)/chronic systolic heart failure - Continue losartan 25 mg daily for now. Patient to monitor her blood pressures and if routinely below 500 systolic and no other identifiable causes for fatigue, may need to decrease regimen. She does have follow-up with her cardiologist next month. - Last ejection fraction within normal range. - CBC w/Diff - Comp Met (CMET)  Fatigue: - Possibly secondary to lower and blood pressures. CBC,  CMP and TSH collected today. If all lab work is normal, and blood pressures returned to normal range, no follow-up needed on fatigue.  Depression, unspecified depression type/anxiety - Stable today. Refills on Celexa  provided - citalopram (CELEXA) 40 MG tablet; TAKE 1 TABLET (40 MG TOTAL) BY MOUTH DAILY.  Dispense: 90 tablet; Refill: 1 - TSH - Follow-up 6 months.  Return in about 6 months (around 08/31/2017) for Depression/anxiety. Patient encouraged to schedule her physical within the next 3-6 months, with fasting lab.  Greater than 45 minutes was spent with patient, greater than 50% of that time was spent face-to-face with patient counseling, coordinating care. Additional time spent abstracting chart and reviewing EMR records.    Note is dictated utilizing voice recognition software. Although note has been proof read prior to signing, occasional typographical errors still can be missed. If any questions arise, please do not hesitate to call for verification.  Electronically signed by: Howard Pouch, DO [place]

## 2017-03-04 ENCOUNTER — Telehealth: Payer: Self-pay | Admitting: Family Medicine

## 2017-03-04 NOTE — Telephone Encounter (Signed)
Please inform patient her labs are all normal. No cause to explain her fatigue. I would encourage her to drink plenty of water, take the vitamins suggested, monitor her blood pressure (parameters given at appt), if fatigue persists and blood pressures low, then follow up sooner. - She could also mention to her cardiologist since she has an appointment next month.

## 2017-03-04 NOTE — Telephone Encounter (Signed)
Called patient left message for patient to return call 

## 2017-03-05 NOTE — Telephone Encounter (Signed)
Spoke with patient reviewed lab results and instructions. Patient verbalized understanding. 

## 2017-04-26 NOTE — Progress Notes (Addendum)
Cardiology Office Note:    Date:  04/27/2017   ID:  Grace Fletcher, DOB [birth year], MRN [id number]  PCP:  Ma Hillock, DO  Cardiologist:  No primary care provider on file.   Referring MD: Chauncey Cruel, MD   Chief Complaint  Patient presents with  . Congestive Heart Failure    History of ASD repair  . Cardiac Valve Problem    History of pulmonic valve stenosis    History of Present Illness:    Grace Fletcher is a 77 y.o. female with a hx of who presents for pulmonic stenosis, prior percutaneous ASD repair, mild dilation of the right ventricle, relatively newly diagnosed left ventricular systolic dysfunction (EF 52% by echo and 57% by nuclear wall motion study during [place]) and history of chest pain.  Grace Fletcher is doing relatively well.  She denies dyspnea.  She notices lassitude and exertional fatigue.  States she is sleeping well.  No chest pain.  No lower extremity edema.  No palpitations or syncope.  She has a very sick grandchild.  Mr. Grace Fletcher had GI bleeding after orthopedic surgery.  Both of these have been very stressful and required much personal attention on her part.   Past Medical History:  Diagnosis Date  . Anxiety 05/26/2013  . Arthritis   . Breast cancer, left breast ([place]) 2006   Lumpectomy, radiation and tamoxifen for 5 years.  . Colon polyp   . Cyst of pancreas   . Depression 05/26/2013  . Migraines   . Moderate tricuspid regurgitation by prior echocardiogram 05/13/2010  . Pulmonary hypertension ([place]) 05/13/2010  . Pulmonary stenosis 05/13/2010   Study Conclusions  - Left ventricle: The cavity size was normal. Systolic function was mildly reduced. The estimated ejection fraction was in the range of 45% to 50%. There is hypokinesis of the anteroseptal myocardium. Doppler parameters are consistent with abnormal left ventricular relaxation (grade 1 diastolic dysfunction). - Right ventricle: The cavity size was mildly dilated. Wall  thickness was normal. Systolic function was mildly reduced. - Right atrium: The atrium was mildly dilated. - Atrial septum: No atrial level shunt, at baseline with Doppler analysis. Prior ASD repair, CardioSeal 2004. - Pulmonic valve: Transvalvular velocity was increased, due to mild degree of stenosis. Peak velocity: 224cm/s (S). - Pulmonary arteries: Systolic pressure was mildly increased.    . 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 BREAST LUMPECTOMY     left breast cancer, status post lumpectomy, radiation and tamoxifen 5 years  . CARPAL TUNNEL RELEASE    . CESAREAN SECTION     x4  . SENTINEL LYMPH NODE BIOPSY      Current Medications: Current Meds  Medication Sig  . amoxicillin (AMOXIL) 500 MG capsule Take 2000 mg (4 capsules) by mouth as needed 30-60 minutes  before dental procedure  . aspirin 81 MG tablet Take 81 mg by mouth daily.    . Cholecalciferol (VITAMIN D) 1000 UNITS capsule Take 1,000 Units by mouth daily.  . citalopram (CELEXA) 40 MG tablet TAKE 1 TABLET (40 MG TOTAL) BY MOUTH DAILY.  Marland Kitchen Docusate Calcium (STOOL SOFTENER PO) Take 1 capsule by mouth daily.   Marland Kitchen losartan (COZAAR) 25 MG tablet Take 1 tablet (25 mg total) by mouth daily. Please make yearly appt with Dr. Tamala Julian for April before anymore refills. 1st attempt  . Multiple Vitamin (MULTIVITAMIN) capsule  Take 1 capsule by mouth daily.       Allergies:   Patient has no known allergies.   Social History   Socioeconomic History  . Marital status: Married    Spouse name: Not on file  . Number of children: 4  . Years of education: Not on file  . Highest education level: Not on file  Occupational History  . Occupation: retired  Scientific laboratory technician  . Financial resource strain: Not on file  . Food insecurity:    Worry: Not on file    Inability: Not on file  . Transportation needs:     Medical: Not on file    Non-medical: Not on file  Tobacco Use  . Smoking status: Former Research scientist (life sciences)  . Smokeless tobacco: Never Used  Substance and Sexual Activity  . Alcohol use: No  . Drug use: No  . Sexual activity: Yes    Partners: Male    Birth control/protection: Post-menopausal  Lifestyle  . Physical activity:    Days per week: Not on file    Minutes per session: Not on file  . Stress: Not on file  Relationships  . Social connections:    Talks on phone: Not on file    Gets together: Not on file    Attends religious service: Not on file    Active member of club or organization: Not on file    Attends meetings of clubs or organizations: Not on file    Relationship status: Not on file  Other Topics Concern  . Not on file  Social History Narrative   Married. 4 children.    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   Takes a daily vitamin.   Smoke alarm in the home.   Wears her seatbelt.   Feels safe in her relationships.     Family History: The patient's family history includes Arthritis in her mother; Dementia in her mother; Heart attack in her father and paternal grandmother; Heart disease in her father and paternal grandmother; Hypertension in her sister.  ROS:   Please see the history of present illness.    Some leg pain, as noted above feels tired.  Stop taking losartan because she thought the medication could be causing her lethargy and fatigue.  All other systems reviewed and are negative.  EKGs/Labs/Other Studies Reviewed:    The following studies were reviewed today: none  EKG:  EKG is not ordered today.    Recent Labs: 03/03/2017: ALT 10; BUN 13; Creatinine, Ser 0.70; Hemoglobin 14.3; Platelets 196.0; Potassium 4.4; Sodium 141; TSH 2.50  Recent Lipid Panel No results found for: CHOL, TRIG, HDL, CHOLHDL, VLDL, LDLCALC, LDLDIRECT  Physical Exam:    VS:  BP 132/82   Pulse 69   Ht 5\' 4"  (1.626 m)   Wt 156 lb 3.2 oz (70.9 kg)   BMI 26.81 kg/m     Wt Readings  from Last 3 Encounters:  04/27/17 156 lb 3.2 oz (70.9 kg)  03/03/17 154 lb 12.8 oz (70.2 kg)  02/19/17 155 lb (70.3 kg)     GEN:  Well nourished, well developed in no acute distress HEENT: Normal NECK: No JVD; No carotid bruits LYMPHATICS: No lymphadenopathy CARDIAC: RRR with 2/6 right upper sternal systolic murmur, but no rubs or gallops RESPIRATORY:  Clear to auscultation without rales, wheezing or rhonchi  ABDOMEN: Soft, non-tender, non-distended MUSCULOSKELETAL:  No edema; No deformity  SKIN: Warm and dry NEUROLOGIC:  Alert and oriented x 3 PSYCHIATRIC:  Normal affect   ASSESSMENT:  1. Chronic systolic heart failure ([place])   2. Nonrheumatic pulmonary valve stenosis   3. Status post device closure of ASD   4. Pulmonary hypertension ([place])   5. Moderate tricuspid regurgitation by prior echocardiogram    PLAN:    In order of problems listed above:  1. Will reassess LV function given her current complaints.  I have asked her to get back on losartan 25 mg/day. 2. There is a soft right upper sternal border systolic murmur.  No left upper sternal border systolic or diastolic murmurs heard. 3. No evidence of recurrent ASD or right to left shunting.  Repeat echo will help to exclude any issues. 4. No clinical evidence of pulmonary hypertension.  2D Doppler echocardiogram to assess LV function and right heart/pulmonary valve.  Clinical follow-up in 1 year.  If LV function is normal we may discontinue the low-dose of losartan.  The current complaints are nonspecific and may not be cardiac related.   Medication Adjustments/Labs and Tests Ordered: Current medicines are reviewed at length with the patient today.  Concerns regarding medicines are outlined above.  Orders Placed This Encounter  Procedures  . ECHOCARDIOGRAM COMPLETE   No orders of the defined types were placed in this encounter.   Signed, Sinclair Grooms, MD  04/27/2017 10:55 AM    [place]

## 2017-04-27 ENCOUNTER — Ambulatory Visit (INDEPENDENT_AMBULATORY_CARE_PROVIDER_SITE_OTHER): Payer: Medicare Other | Admitting: Interventional Cardiology

## 2017-04-27 ENCOUNTER — Encounter: Payer: Self-pay | Admitting: Interventional Cardiology

## 2017-04-27 VITALS — BP 132/82 | HR 69 | Ht 64.0 in | Wt 156.2 lb

## 2017-04-27 DIAGNOSIS — I370 Nonrheumatic pulmonary valve stenosis: Secondary | ICD-10-CM | POA: Diagnosis not present

## 2017-04-27 DIAGNOSIS — I071 Rheumatic tricuspid insufficiency: Secondary | ICD-10-CM

## 2017-04-27 DIAGNOSIS — Z8774 Personal history of (corrected) congenital malformations of heart and circulatory system: Secondary | ICD-10-CM

## 2017-04-27 DIAGNOSIS — I272 Pulmonary hypertension, unspecified: Secondary | ICD-10-CM

## 2017-04-27 DIAGNOSIS — I5022 Chronic systolic (congestive) heart failure: Secondary | ICD-10-CM

## 2017-04-27 NOTE — Patient Instructions (Signed)
Medication Instructions:  1) RESTART Losartan 25mg  once daily  Labwork: None  Testing/Procedures: Your physician has requested that you have an echocardiogram. Echocardiography is a painless test that uses sound waves to create images of your heart. It provides your doctor with information about the size and shape of your heart and how well your heart's chambers and valves are working. This procedure takes approximately one hour. There are no restrictions for this procedure.   Follow-Up: Your physician wants you to follow-up in: 1 year with Dr. Tamala Julian.  You will receive a reminder letter in the mail two months in advance. If you don't receive a letter, please call our office to schedule the follow-up appointment.   Any Other Special Instructions Will Be Listed Below (If Applicable).     If you need a refill on your cardiac medications before your next appointment, please call your pharmacy.

## 2017-05-01 ENCOUNTER — Other Ambulatory Visit: Payer: Self-pay

## 2017-05-01 ENCOUNTER — Ambulatory Visit (HOSPITAL_COMMUNITY): Payer: Medicare Other | Attending: Cardiovascular Disease

## 2017-05-01 DIAGNOSIS — I081 Rheumatic disorders of both mitral and tricuspid valves: Secondary | ICD-10-CM | POA: Diagnosis not present

## 2017-05-01 DIAGNOSIS — I088 Other rheumatic multiple valve diseases: Secondary | ICD-10-CM | POA: Diagnosis not present

## 2017-05-01 DIAGNOSIS — I5022 Chronic systolic (congestive) heart failure: Secondary | ICD-10-CM | POA: Diagnosis not present

## 2017-05-06 ENCOUNTER — Telehealth: Payer: Self-pay | Admitting: *Deleted

## 2017-05-06 MED ORDER — METOPROLOL SUCCINATE ER 25 MG PO TB24: 12.5000 mg | ORAL_TABLET | Freq: Every day | ORAL | 1 refills | Status: DC

## 2017-05-06 NOTE — Telephone Encounter (Signed)
Spoke with pt and went over results and recommendations per Dr. Tamala Julian.  Pt verbalized understanding and was in agreement with this plan. Pt requested we send a 30 day supply to local pharmacy and if she tolerates it we will send 90 days to [facility].

## 2017-05-06 NOTE — Telephone Encounter (Signed)
-----   Message from Belva Crome, MD sent at 05/05/2017  5:37 PM EDT ----- Let the patient know the heart function is mildly decreased.  It is improved compared to prior.  We need to continue losartan and I would recommend starting metoprolol succinate 12.5 mg daily. A copy will be sent to Kuneff, Renee A, DO

## 2017-05-07 ENCOUNTER — Other Ambulatory Visit: Payer: Self-pay | Admitting: Interventional Cardiology

## 2017-06-10 ENCOUNTER — Other Ambulatory Visit: Payer: Self-pay | Admitting: *Deleted

## 2017-06-10 ENCOUNTER — Telehealth: Payer: Self-pay | Admitting: Interventional Cardiology

## 2017-06-10 MED ORDER — METOPROLOL SUCCINATE ER 25 MG PO TB24: 12.5000 mg | ORAL_TABLET | Freq: Every day | ORAL | 3 refills | Status: DC

## 2017-06-10 NOTE — Telephone Encounter (Signed)
°*  STAT* If patient is at the pharmacy, call can be transferred to refill team.   1. Which medications need to be refilled? (please list name of each medication and dose if known) metoprolol succinate (TOPROL-XL) 25 MG 24 hr tablet Take 0.5 tablets (12.5 mg total) by mouth daily.   2. Which pharmacy/location (including street and city if local pharmacy) is medication to be sent to? [facility]  3. Do they need a 30 day or 90 day supply?  [place]

## 2017-06-25 ENCOUNTER — Other Ambulatory Visit: Payer: Self-pay | Admitting: Interventional Cardiology

## 2017-08-03 ENCOUNTER — Telehealth: Payer: Self-pay | Admitting: Interventional Cardiology

## 2017-08-03 MED ORDER — CARVEDILOL 3.125 MG PO TABS: 3.1250 mg | ORAL_TABLET | Freq: Two times a day (BID) | ORAL | 1 refills | Status: DC

## 2017-08-03 NOTE — Telephone Encounter (Signed)
Pt states after starting Metoprolol she began to have significant swelling in bilateral feet and ankles.  So about a month ago she stopped the Metoprolol and swelling resolved.  Has had no further issues since then.  Pt would like to know if there is a different medication Dr. Tamala Julian would like for her to try?  Pt was placed on Metoprolol Succ 12.5mg  QD d/t EF being 40-45%.  Will route to Dr. Tamala Julian for review.

## 2017-08-03 NOTE — Telephone Encounter (Signed)
Spoke with pt and went over recommendations per Dr. Tamala Julian.  Pt asked that I send a 30 day supply to [facility] and if she does ok then we will send 90 day supply to mail order pharmacy.  Pt verbalized understanding and was appreciative for call.

## 2017-08-03 NOTE — Telephone Encounter (Signed)
Carvedilol 3.125 mg twice daily

## 2017-09-03 ENCOUNTER — Other Ambulatory Visit: Payer: Self-pay

## 2017-09-03 DIAGNOSIS — F329 Major depressive disorder, single episode, unspecified: Secondary | ICD-10-CM

## 2017-09-03 DIAGNOSIS — F32A Depression, unspecified: Secondary | ICD-10-CM

## 2017-09-03 DIAGNOSIS — F419 Anxiety disorder, unspecified: Secondary | ICD-10-CM

## 2017-09-03 MED ORDER — CITALOPRAM HYDROBROMIDE 40 MG PO TABS: ORAL_TABLET | ORAL | 0 refills | Status: DC

## 2017-09-29 ENCOUNTER — Other Ambulatory Visit: Payer: Self-pay | Admitting: Interventional Cardiology

## 2017-11-19 ENCOUNTER — Ambulatory Visit (INDEPENDENT_AMBULATORY_CARE_PROVIDER_SITE_OTHER): Payer: Medicare Other | Admitting: Family Medicine

## 2017-11-19 ENCOUNTER — Encounter: Payer: Self-pay | Admitting: Family Medicine

## 2017-11-19 VITALS — BP 109/71 | HR 54 | Temp 98.0°F | Resp 20 | Ht 64.0 in | Wt 157.8 lb

## 2017-11-19 DIAGNOSIS — I5022 Chronic systolic (congestive) heart failure: Secondary | ICD-10-CM

## 2017-11-19 DIAGNOSIS — Z23 Encounter for immunization: Secondary | ICD-10-CM | POA: Diagnosis not present

## 2017-11-19 DIAGNOSIS — E2839 Other primary ovarian failure: Secondary | ICD-10-CM

## 2017-11-19 DIAGNOSIS — F329 Major depressive disorder, single episode, unspecified: Secondary | ICD-10-CM

## 2017-11-19 DIAGNOSIS — I272 Pulmonary hypertension, unspecified: Secondary | ICD-10-CM

## 2017-11-19 DIAGNOSIS — Z79899 Other long term (current) drug therapy: Secondary | ICD-10-CM | POA: Diagnosis not present

## 2017-11-19 DIAGNOSIS — F419 Anxiety disorder, unspecified: Secondary | ICD-10-CM

## 2017-11-19 DIAGNOSIS — F418 Other specified anxiety disorders: Secondary | ICD-10-CM

## 2017-11-19 DIAGNOSIS — C50912 Malignant neoplasm of unspecified site of left female breast: Secondary | ICD-10-CM

## 2017-11-19 DIAGNOSIS — Z1239 Encounter for other screening for malignant neoplasm of breast: Secondary | ICD-10-CM

## 2017-11-19 DIAGNOSIS — Z0000 Encounter for general adult medical examination without abnormal findings: Secondary | ICD-10-CM

## 2017-11-19 DIAGNOSIS — Z131 Encounter for screening for diabetes mellitus: Secondary | ICD-10-CM | POA: Diagnosis not present

## 2017-11-19 DIAGNOSIS — F32A Depression, unspecified: Secondary | ICD-10-CM

## 2017-11-19 LAB — CBC WITH DIFFERENTIAL/PLATELET
BASOS ABS: 0 10*3/uL (ref 0.0–0.1)
Basophils Relative: 0.9 % (ref 0.0–3.0)
Eosinophils Absolute: 0.1 10*3/uL (ref 0.0–0.7)
Eosinophils Relative: 2.6 % (ref 0.0–5.0)
HCT: 43.7 % (ref 36.0–46.0)
Hemoglobin: 14.9 g/dL (ref 12.0–15.0)
Lymphocytes Relative: 33.9 % (ref 12.0–46.0)
Lymphs Abs: 1.7 10*3/uL (ref 0.7–4.0)
MCHC: 34.1 g/dL (ref 30.0–36.0)
MCV: 94.6 fl (ref 78.0–100.0)
MONO ABS: 0.4 10*3/uL (ref 0.1–1.0)
MONOS PCT: 7.9 % (ref 3.0–12.0)
Neutro Abs: 2.8 10*3/uL (ref 1.4–7.7)
Neutrophils Relative %: 54.7 % (ref 43.0–77.0)
PLATELETS: 190 10*3/uL (ref 150.0–400.0)
RBC: 4.62 Mil/uL (ref 3.87–5.11)
RDW: 13.2 % (ref 11.5–15.5)
WBC: 5.1 10*3/uL (ref 4.0–10.5)

## 2017-11-19 LAB — TSH: TSH: 2.92 u[IU]/mL (ref 0.35–4.50)

## 2017-11-19 LAB — COMPREHENSIVE METABOLIC PANEL
ALT: 12 U/L (ref 0–35)
AST: 17 U/L (ref 0–37)
Albumin: 4.3 g/dL (ref 3.5–5.2)
Alkaline Phosphatase: 68 U/L (ref 39–117)
BILIRUBIN TOTAL: 0.5 mg/dL (ref 0.2–1.2)
BUN: 13 mg/dL (ref 6–23)
CHLORIDE: 103 meq/L (ref 96–112)
CO2: 27 meq/L (ref 19–32)
CREATININE: 0.71 mg/dL (ref 0.40–1.20)
Calcium: 9.5 mg/dL (ref 8.4–10.5)
GFR: 85.13 mL/min (ref >60.00)
GLUCOSE: 92 mg/dL (ref 70–99)
Potassium: 4.1 mEq/L (ref 3.5–5.1)
SODIUM: 138 meq/L (ref 135–145)
Total Protein: 6.7 g/dL (ref 6.0–8.3)

## 2017-11-19 LAB — HEMOGLOBIN A1C: Hgb A1c MFr Bld: 5.8 % (ref 4.6–6.5)

## 2017-11-19 LAB — LIPID PANEL
CHOL/HDL RATIO: 4
Cholesterol: 248 mg/dL — ABNORMAL HIGH (ref 0–200)
HDL: 66.6 mg/dL (ref >39.00)
LDL CALC: 161 mg/dL — AB (ref 0–99)
NONHDL: 181.45
Triglycerides: 100 mg/dL (ref 0.0–149.0)
VLDL: 20 mg/dL (ref 0.0–40.0)

## 2017-11-19 MED ORDER — ZOSTER VAC RECOMB ADJUVANTED 50 MCG/0.5ML IM SUSR: 0.5000 mL | Freq: Once | INTRAMUSCULAR | 1 refills | Status: AC

## 2017-11-19 MED ORDER — CITALOPRAM HYDROBROMIDE 40 MG PO TABS: ORAL_TABLET | ORAL | 1 refills | Status: DC

## 2017-11-19 NOTE — Patient Instructions (Addendum)
Mammogram and bone density ordered--> they will call to schedule  Refilled meds today. Shingrix shot series of 2 start in about 4 weeks, let us know when yo get them. Printed script for them today.   We will call you with labs when resulted.   Health Maintenance, Female Adopting a healthy lifestyle and getting preventive care can go a long way to promote health and wellness. Talk with your health care provider about what schedule of regular examinations is right for you. This is a good chance for you to check in with your provider about disease prevention and staying healthy. In between checkups, there are plenty of things you can do on your own. Experts have done a lot of research about which lifestyle changes and preventive measures are most likely to keep you healthy. Ask your health care provider for more information. Weight and diet Eat a healthy diet  Be sure to include plenty of vegetables, fruits, low-fat dairy products, and lean protein.  Do not eat a lot of foods high in solid fats, added sugars, or salt.  Get regular exercise. This is one of the most important things you can do for your health. ? Most adults should exercise for at least 150 minutes each week. The exercise should increase your heart rate and make you sweat (moderate-intensity exercise). ? Most adults should also do strengthening exercises at least twice a week. This is in addition to the moderate-intensity exercise.  Maintain a healthy weight  Body mass index (BMI) is a measurement that can be used to identify possible weight problems. It estimates body fat based on height and weight. Your health care provider can help determine your BMI and help you achieve or maintain a healthy weight.  For females 21 years of age and older: ? A BMI below 18.5 is considered underweight. ? A BMI of 18.5 to 24.9 is normal. ? A BMI of 25 to 29.9 is considered overweight. ? A BMI of 30 and above is considered obese.  Watch  levels of cholesterol and blood lipids  You should start having your blood tested for lipids and cholesterol at 75 years of age, then have this test every 5 years.  You may need to have your cholesterol levels checked more often if: ? Your lipid or cholesterol levels are high. ? You are older than 75 years of age. ? You are at high risk for heart disease.  Cancer screening Lung Cancer  Lung cancer screening is recommended for adults 2-53 years old who are at high risk for lung cancer because of a history of smoking.  A yearly low-dose CT scan of the lungs is recommended for people who: ? Currently smoke. ? Have quit within the past 15 years. ? Have at least a 30-pack-year history of smoking. A pack year is smoking an average of one pack of cigarettes a day for 1 year.  Yearly screening should continue until it has been 15 years since you quit.  Yearly screening should stop if you develop a health problem that would prevent you from having lung cancer treatment.  Breast Cancer  Practice breast self-awareness. This means understanding how your breasts normally appear and feel.  It also means doing regular breast self-exams. Let your health care provider know about any changes, no matter how small.  If you are in your 20s or 30s, you should have a clinical breast exam (CBE) by a health care provider every 1-3 years as part of a regular health  exam.  If you are 40 or older, have a CBE every year. Also consider having a breast X-ray (mammogram) every year.  If you have a family history of breast cancer, talk to your health care provider about genetic screening.  If you are at high risk for breast cancer, talk to your health care provider about having an MRI and a mammogram every year.  Breast cancer gene (BRCA) assessment is recommended for women who have family members with BRCA-related cancers. BRCA-related cancers include: ? Breast. ? Ovarian. ? Tubal. ? Peritoneal  cancers.  Results of the assessment will determine the need for genetic counseling and BRCA1 and BRCA2 testing.  Cervical Cancer Your health care provider may recommend that you be screened regularly for cancer of the pelvic organs (ovaries, uterus, and vagina). This screening involves a pelvic examination, including checking for microscopic changes to the surface of your cervix (Pap test). You may be encouraged to have this screening done every 3 years, beginning at age 41.  For women ages 30-65, health care providers may recommend pelvic exams and Pap testing every 3 years, or they may recommend the Pap and pelvic exam, combined with testing for human papilloma virus (HPV), every 5 years. Some types of HPV increase your risk of cervical cancer. Testing for HPV may also be done on women of any age with unclear Pap test results.  Other health care providers may not recommend any screening for nonpregnant women who are considered low risk for pelvic cancer and who do not have symptoms. Ask your health care provider if a screening pelvic exam is right for you.  If you have had past treatment for cervical cancer or a condition that could lead to cancer, you need Pap tests and screening for cancer for at least 20 years after your treatment. If Pap tests have been discontinued, your risk factors (such as having a new sexual partner) need to be reassessed to determine if screening should resume. Some women have medical problems that increase the chance of getting cervical cancer. In these cases, your health care provider may recommend more frequent screening and Pap tests.  Colorectal Cancer  This type of cancer can be detected and often prevented.  Routine colorectal cancer screening usually begins at 75 years of age and continues through 75 years of age.  Your health care provider may recommend screening at an earlier age if you have risk factors for colon cancer.  Your health care provider may also  recommend using home test kits to check for hidden blood in the stool.  A small camera at the end of a tube can be used to examine your colon directly (sigmoidoscopy or colonoscopy). This is done to check for the earliest forms of colorectal cancer.  Routine screening usually begins at age 50.  Direct examination of the colon should be repeated every 5-10 years through 75 years of age. However, you may need to be screened more often if early forms of precancerous polyps or small growths are found.  Skin Cancer  Check your skin from head to toe regularly.  Tell your health care provider about any new moles or changes in moles, especially if there is a change in a mole's shape or color.  Also tell your health care provider if you have a mole that is larger than the size of a pencil eraser.  Always use sunscreen. Apply sunscreen liberally and repeatedly throughout the day.  Protect yourself by wearing long sleeves, pants, a wide-brimmed  hat, and sunglasses whenever you are outside.  Heart disease, diabetes, and high blood pressure  High blood pressure causes heart disease and increases the risk of stroke. High blood pressure is more likely to develop in: ? People who have blood pressure in the high end of the normal range (130-139/85-89 mm Hg). ? People who are overweight or obese. ? People who are African American.  If you are 69-90 years of age, have your blood pressure checked every 3-5 years. If you are 67 years of age or older, have your blood pressure checked every year. You should have your blood pressure measured twice-once when you are at a hospital or clinic, and once when you are not at a hospital or clinic. Record the average of the two measurements. To check your blood pressure when you are not at a hospital or clinic, you can use: ? An automated blood pressure machine at a pharmacy. ? A home blood pressure monitor.  If you are between 44 years and 25 years old, ask your  health care provider if you should take aspirin to prevent strokes.  Have regular diabetes screenings. This involves taking a blood sample to check your fasting blood sugar level. ? If you are at a normal weight and have a low risk for diabetes, have this test once every three years after 75 years of age. ? If you are overweight and have a high risk for diabetes, consider being tested at a younger age or more often. Preventing infection Hepatitis B  If you have a higher risk for hepatitis B, you should be screened for this virus. You are considered at high risk for hepatitis B if: ? You were born in a country where hepatitis B is common. Ask your health care provider which countries are considered high risk. ? Your parents were born in a high-risk country, and you have not been immunized against hepatitis B (hepatitis B vaccine). ? You have HIV or AIDS. ? You use needles to inject street drugs. ? You live with someone who has hepatitis B. ? You have had sex with someone who has hepatitis B. ? You get hemodialysis treatment. ? You take certain medicines for conditions, including cancer, organ transplantation, and autoimmune conditions.  Hepatitis C  Blood testing is recommended for: ? Everyone born from [birth year] through 1963. ? Anyone with known risk factors for hepatitis C.  Sexually transmitted infections (STIs)  You should be screened for sexually transmitted infections (STIs) including gonorrhea and chlamydia if: ? You are sexually active and are younger than 75 years of age. ? You are older than 75 years of age and your health care provider tells you that you are at risk for this type of infection. ? Your sexual activity has changed since you were last screened and you are at an increased risk for chlamydia or gonorrhea. Ask your health care provider if you are at risk.  If you do not have HIV, but are at risk, it may be recommended that you take a prescription medicine daily to  prevent HIV infection. This is called pre-exposure prophylaxis (PrEP). You are considered at risk if: ? You are sexually active and do not regularly use condoms or know the HIV status of your partner(s). ? You take drugs by injection. ? You are sexually active with a partner who has HIV.  Talk with your health care provider about whether you are at high risk of being infected with HIV. If you choose to begin  PrEP, you should first be tested for HIV. You should then be tested every 3 months for as long as you are taking PrEP. Pregnancy  If you are premenopausal and you may become pregnant, ask your health care provider about preconception counseling.  If you may become pregnant, take 400 to 800 micrograms (mcg) of folic acid every day.  If you want to prevent pregnancy, talk to your health care provider about birth control (contraception). Osteoporosis and menopause  Osteoporosis is a disease in which the bones lose minerals and strength with aging. This can result in serious bone fractures. Your risk for osteoporosis can be identified using a bone density scan.  If you are 36 years of age or older, or if you are at risk for osteoporosis and fractures, ask your health care provider if you should be screened.  Ask your health care provider whether you should take a calcium or vitamin D supplement to lower your risk for osteoporosis.  Menopause may have certain physical symptoms and risks.  Hormone replacement therapy may reduce some of these symptoms and risks. Talk to your health care provider about whether hormone replacement therapy is right for you. Follow these instructions at home:  Schedule regular health, dental, and eye exams.  Stay current with your immunizations.  Do not use any tobacco products including cigarettes, chewing tobacco, or electronic cigarettes.  If you are pregnant, do not drink alcohol.  If you are breastfeeding, limit how much and how often you drink  alcohol.  Limit alcohol intake to no more than 1 drink per day for nonpregnant women. One drink equals 12 ounces of beer, 5 ounces of wine, or 1 ounces of hard liquor.  Do not use street drugs.  Do not share needles.  Ask your health care provider for help if you need support or information about quitting drugs.  Tell your health care provider if you often feel depressed.  Tell your health care provider if you have ever been abused or do not feel safe at home. This information is not intended to replace advice given to you by your health care provider. Make sure you discuss any questions you have with your health care provider. Document Released: 07/29/2010 Document Revised: 06/21/2015 Document Reviewed: 10/17/2014 Elsevier Interactive Patient Education  2018 [employer].   Please help [place] help you:  We are honored you have chosen [facility] for your Primary Care home. Below you will find basic instructions that you may need to access in the future. Please help [place] help you by reading the instructions, which cover many of the frequent questions we experience.   Prescription refills and request:  -In order to allow more efficient response time, please call your pharmacy for all refills. They will forward the request electronically to [place]. This allows for the quickest possible response. Request left on a nurse line can take longer to refill, since these are checked as time allows between office patients and other phone calls.  - refill request can take up to 3-5 working days to complete.  - If request is sent electronically and request is appropiate, it is usually completed in 1-2 business days.  - all patients will need to be seen routinely for all chronic medical conditions requiring prescription medications (see follow-up below). If you are overdue for follow up on your condition, you will be asked to make an appointment and we will call in enough medication to cover you until your  appointment (up to 30  days).  - all controlled substances will require a face to face visit to request/refill.  - if you desire your prescriptions to go through a new pharmacy, and have an active script at original pharmacy, you will need to call your pharmacy and have scripts transferred to new pharmacy. This is completed between the pharmacy locations and not by your provider.    Results: If any images or labs were ordered, it can take up to 1 week to get results depending on the test ordered and the lab/facility running and resulting the test. - Normal or stable results, which do not need further discussion, may be released to your mychart immediately with attached note to you. A call may not be generated for normal results. Please make certain to sign up for mychart. If you have questions on how to activate your mychart you can call the front office.  - If your results need further discussion, our office will attempt to contact you via phone, and if unable to reach you after 2 attempts, we will release your abnormal result to your mychart with instructions.  - All results will be automatically released in mychart after 1 week.  - Your provider will provide you with explanation and instruction on all relevant material in your results. Please keep in mind, results and labs may appear confusing or abnormal to the untrained eye, but it does not mean they are actually abnormal for you personally. If you have any questions about your results that are not covered, or you desire more detailed explanation than what was provided, you should make an appointment with your provider to do so.   Our office handles many outgoing and incoming calls daily. If we have not contacted you within 1 week about your results, please check your mychart to see if there is a message first and if not, then contact our office.  In helping with this matter, you help decrease call volume, and therefore allow [place] to be able to respond  to patients needs more efficiently.   Acute office visits (sick visit):  An acute visit is intended for a new problem and are scheduled in shorter time slots to allow schedule openings for patients with new problems. This is the appropriate visit to discuss a new problem. Problems will not be addressed by phone call or Echart message. Appointment is needed if requesting treatment. In order to provide you with excellent quality medical care with proper time for you to explain your problem, have an exam and receive treatment with instructions, these appointments should be limited to one new problem per visit. If you experience a new problem, in which you desire to be addressed, please make an acute office visit, we save openings on the schedule to accommodate you. Please do not save your new problem for any other type of visit, let us take care of it properly and quickly for you.   Follow up visits:  Depending on your condition(s) your provider will need to see you routinely in order to provide you with quality care and prescribe medication(s). Most chronic conditions (Example: hypertension, Diabetes, depression/anxiety... etc), require visits a couple times a year. Your provider will instruct you on proper follow up for your personal medical conditions and history. Please make certain to make follow up appointments for your condition as instructed. Failing to do so could result in lapse in your medication treatment/refills. If you request a refill, and are overdue to be seen on a condition, we will  always provide you with a 30 day script (once) to allow you time to schedule.    Medicare wellness (well visit): - we have a wonderful Nurse Maudie Mercury), that will meet with you and provide you will yearly medicare wellness visits. These visits should occur yearly (can not be scheduled less than 1 calendar year apart) and cover preventive health, immunizations, advance directives and screenings you are entitled to  yearly through your medicare benefits. Do not miss out on your entitled benefits, this is when medicare will pay for these benefits to be ordered for you.  These are strongly encouraged by your provider and is the appropriate type of visit to make certain you are up to date with all preventive health benefits. If you have not had your medicare wellness exam in the last 12 months, please make certain to schedule one by calling the office and schedule your medicare wellness with Maudie Mercury as soon as possible.   Yearly physical (well visit):  - Adults are recommended to be seen yearly for physicals. Check with your insurance and date of your last physical, most insurances require one calendar year between physicals. Physicals include all preventive health topics, screenings, medical exam and labs that are appropriate for gender/age and history. You may have fasting labs needed at this visit. This is a well visit (not a sick visit), new problems should not be covered during this visit (see acute visit).  - Pediatric patients are seen more frequently when they are younger. Your provider will advise you on well child visit timing that is appropriate for your their age. - This is not a medicare wellness visit. Medicare wellness exams do not have an exam portion to the visit. Some medicare companies allow for a physical, some do not allow a yearly physical. If your medicare allows a yearly physical you can schedule the medicare wellness with our nurse Maudie Mercury and have your physical with your provider after, on the same day. Please check with insurance for your full benefits.   Late Policy/No Shows:  - all new patients should arrive 15-30 minutes earlier than appointment to allow [place] time  to  obtain all personal demographics,  insurance information and for you to complete office paperwork. - All established patients should arrive 10-15 minutes earlier than appointment time to update all information and be checked in .  - In  our best efforts to run on time, if you are late for your appointment you will be asked to either reschedule or if able, we will work you back into the schedule. There will be a wait time to work you back in the schedule,  depending on availability.  - If you are unable to make it to your appointment as scheduled, please call 24 hours ahead of time to allow [place] to fill the time slot with someone else who needs to be seen. If you do not cancel your appointment ahead of time, you may be charged a no show fee.

## 2017-11-19 NOTE — Progress Notes (Signed)
[name], female  DOB: November 07, 1940, 77 y.o.   MRN: [id number] Patient Care Team    Relationship Specialty Notifications Start End  Ma Hillock, DO PCP - General Family Medicine  03/03/17   Belva Crome, MD PCP - Cardiology Cardiology Admissions 05/07/17   Magrinat, Virgie Dad, MD Consulting Physician Oncology  03/03/17   Renelda Loma, OD  Optometry  03/03/17   Dian Queen, MD Consulting Physician Obstetrics and Gynecology  03/03/17     Chief Complaint  Patient presents with  . Annual Exam    Subjective:  Grace Fletcher is a 77 y.o.  Female  present for CPE. All past medical history, surgical history, allergies, family history, immunizations, medications and social history were updated in the electronic medical record today. All recent labs, ED visits and hospitalizations within the last year were reviewed.  She complains of fatigue today. She reports when she takes her losartan 25 mg it makes her tired. She feels better when she does not take it. She has low end BP and she thinks it is causing her BP to drop low. She is also on cored low dose BID secondary to cardiac h/o pulmonary stenosis, ASD repair, mild dilation right ventricle, left ventricular systolic dysfunction (EF 86%--> 40-45 % last eco 04/2017). She denies dyspnea or chest pain.   Health maintenance:  Colonoscopy: completed 01/28/2015 Mammogram: completed:01/27/2014, prior h/o left breast cancer (stage IIa) lumpectomy, radiation and tamoxifen for 5 years.  Cervical cancer screening: > 65 Immunizations: tdap 03/28/2015, Influenza completed today (encouraged yearly), PNA series PNA 23 started today, shingrix script provided today (wait 4 weeks until start with 2 vaccines given today) DEXA: uncertain if she has had prior, no record. Assistive device: none Oxygen VEH:MCNO Patient has a Dental home. Hospitalizations/ED visits: reviewed recent echo  Echo 05/01/2017: Study Conclusions - Left ventricle:  The cavity size was normal. Systolic function was   mildly to moderately reduced. The estimated ejection fraction was   in the range of 40% to 45%. Hypokinesis of the anteroseptal   myocardium. Doppler parameters are consistent with abnormal left   ventricular relaxation (grade 1 diastolic dysfunction). - Aortic valve: Transvalvular velocity was within the normal range.   There was no stenosis. There was no regurgitation. - Mitral valve: Transvalvular velocity was within the normal range.   There was no evidence for stenosis. There was mild regurgitation. - Left atrium: The atrium was mildly dilated. - Right ventricle: The cavity size was normal. Wall thickness was   normal. Systolic function was normal. - Atrial septum: No defect or patent foramen ovale was identified   by color flow Doppler. - Tricuspid valve: There was mild regurgitation. - Pulmonic valve: The findings are consistent with mild stenosis.   Peak gradient (S): 18 mm Hg. - Pulmonary arteries: Systolic pressure was within the normal   range. PA peak pressure: 24 mm Hg (S).  Impressions:  - Mild pulmonic stenosis unchanged from the echo 10/2012.  Depression screen [facility] 2/9 11/19/2017 03/03/2017  Decreased Interest 0 0  Down, Depressed, Hopeless 0 1  PHQ - 2 Score 0 1  Altered sleeping 0 0  Tired, decreased energy 1 1  Change in appetite 0 1  Feeling bad or failure about yourself  0 0  Trouble concentrating 0 0  Moving slowly or fidgety/restless 0 0  Suicidal thoughts 0 0  PHQ-9 Score 1 3  Difficult doing work/chores Not difficult at all -  GAD 7 : Generalized Anxiety Score 03/03/2017  Nervous, Anxious, on Edge 0  Control/stop worrying 0  Worry too much - different things 0  Trouble relaxing 0  Restless 0  Easily annoyed or irritable 0  Afraid - awful might happen 0  Total GAD 7 Score 0     Current Exercise Habits: The patient does not participate in regular exercise at present Exercise limited by: None  identified   Immunization History  Administered Date(s) Administered  . DTaP 04/13/2015  . Influenza, High Dose Seasonal PF 11/27/2016, 11/19/2017  . Pneumococcal Polysaccharide-23 11/19/2017     Past Medical History:  Diagnosis Date  . Anxiety 05/26/2013  . Arthritis   . Breast cancer, left breast ([place]) 2006   Lumpectomy, radiation and tamoxifen for 5 years.  . Colon polyp   . Cyst of pancreas   . Depression 05/26/2013  . Migraines   . Moderate tricuspid regurgitation by prior echocardiogram 05/13/2010  . Pulmonary hypertension ([place]) 05/13/2010  . Pulmonary stenosis 05/13/2010   Study Conclusions  - Left ventricle: The cavity size was normal. Systolic function was mildly reduced. The estimated ejection fraction was in the range of 45% to 50%. There is hypokinesis of the anteroseptal myocardium. Doppler parameters are consistent with abnormal left ventricular relaxation (grade 1 diastolic dysfunction). - Right ventricle: The cavity size was mildly dilated. Wall thickness was normal. Systolic function was mildly reduced. - Right atrium: The atrium was mildly dilated. - Atrial septum: No atrial level shunt, at baseline with Doppler analysis. Prior ASD repair, CardioSeal 2004. - Pulmonic valve: Transvalvular velocity was increased, due to mild degree of stenosis. Peak velocity: 224cm/s (S). - Pulmonary arteries: Systolic pressure was mildly increased.    . 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   CardioSEAL device, 2004; [facility]    No Known Allergies Past Surgical History:  Procedure Laterality Date  . BREAST LUMPECTOMY     left breast cancer, status post lumpectomy, radiation and tamoxifen 5 years  . CARPAL TUNNEL RELEASE    . CESAREAN SECTION     x4  . SENTINEL LYMPH NODE BIOPSY     Family History  Problem Relation Age of Onset  . Dementia Mother   . Arthritis Mother   . Heart attack Father   . Heart disease Father   .  Hypertension Sister   . Heart attack Paternal Grandmother   . Heart disease Paternal Grandmother    Social History   Socioeconomic History  . Marital status: Married    Spouse name: Not on file  . Number of children: 4  . Years of education: Not on file  . Highest education level: Not on file  Occupational History  . Occupation: retired  Scientific laboratory technician  . Financial resource strain: Not on file  . Food insecurity:    Worry: Not on file    Inability: Not on file  . Transportation needs:    Medical: Not on file    Non-medical: Not on file  Tobacco Use  . Smoking status: Former Research scientist (life sciences)  . Smokeless tobacco: Never Used  Substance and Sexual Activity  . Alcohol use: No  . Drug use: No  . Sexual activity: Yes    Partners: Male    Birth control/protection: Post-menopausal  Lifestyle  . Physical activity:    Days per week: Not on file    Minutes per session: Not on file  . Stress: Not on file  Relationships  . Social connections:  Talks on phone: Not on file    Gets together: Not on file    Attends religious service: Not on file    Active member of club or organization: Not on file    Attends meetings of clubs or organizations: Not on file    Relationship status: Not on file  . Intimate partner violence:    Fear of current or ex partner: Not on file    Emotionally abused: Not on file    Physically abused: Not on file    Forced sexual activity: Not on file  Other Topics Concern  . Not on file  Social History Narrative   Married. 4 children.    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   Takes a daily vitamin.   Smoke alarm in the home.   Wears her seatbelt.   Feels safe in her relationships.   Allergies as of 11/19/2017   No Known Allergies     Medication List        Accurate as of 11/19/17  1:20 PM. Always use your most recent med list.          aspirin 81 MG tablet Take 81 mg by mouth daily.   carvedilol 3.125 MG tablet Commonly known as:  COREG Take 1  tablet (3.125 mg total) by mouth 2 (two) times daily.   citalopram 40 MG tablet Commonly known as:  CELEXA TAKE 1 TABLET (40 MG TOTAL) BY MOUTH DAILY. Follow up appointment needed for further refills.   losartan 25 MG tablet Commonly known as:  COZAAR Take 1 tablet (25 mg total) by mouth daily.   multivitamin capsule Take 1 capsule by mouth daily.   STOOL SOFTENER PO Take 1 capsule by mouth daily.   Vitamin D 1000 units capsule Take 1,000 Units by mouth daily.   Zoster Vaccine Adjuvanted injection Commonly known as:  SHINGRIX Inject 0.5 mLs into the muscle once for 1 dose. Rpt dose in 2-6 months       All past medical history, surgical history, allergies, family history, immunizations andmedications were updated in the EMR today and reviewed under the history and medication portions of their EMR.     No results found for this or any previous visit (from the past 2160 hour(s)).   ROS: 14 pt review of systems performed and negative (unless mentioned in an HPI)  Objective: BP 109/71 (BP Location: Left Arm, Patient Position: Sitting, Cuff Size: Normal)   Pulse (!) 54   Temp 98 F (36.7 C)   Resp 20   Ht 5' 4"  (1.626 m)   Wt 157 lb 12.8 oz (71.6 kg)   SpO2 97%   BMI 27.09 kg/m  Gen: Afebrile. No acute distress. Nontoxic in appearance, well-developed, well-nourished,  Pleasant caucasian female.  HENT: AT. [place]. Bilateral TM visualized and normal in appearance, normal external auditory canal. MMM, no oral lesions, adequate dentition. Bilateral nares within normal limits. Throat without erythema, ulcerations or exudates. no Cough on exam, no hoarseness on exam. Eyes:Pupils Equal Round Reactive to light, Extraocular movements intact,  Conjunctiva without redness, discharge or icterus. Neck/lymp/endocrine: Supple,no lymphadenopathy, no thyromegaly CV: RRR 2/6 RUSB murmur, noedema, +2/4 P posterior tibialis pulses. no carotid bruits. No JVD. Chest: CTAB, no wheeze, rhonchi or  crackles. normal Respiratory effort. good Air movement. Abd: Soft. flat. NTND. BS present. no Masses palpated. No hepatosplenomegaly. No rebound tenderness or guarding. Skin: no rashes, purpura or petechiae. Warm and well-perfused. Skin intact. Neuro/Msk:  Normal gait. PERLA. EOMi. Alert. Oriented x3.  Cranial nerves II through XII intact. Muscle strength 5/5 upper/lower extremity. DTRs equal bilaterally. Psych: Normal affect, dress and demeanor. Normal speech. Normal thought content and judgment.  No exam data present  Assessment/plan: Grace Fletcher is a 77 y.o. female present for CPE. Immunization due - Flu vaccine HIGH DOSE PF (Fluzone High dose) - Pneumococcal polysaccharide vaccine 23-valent greater than or equal to 2yo subcutaneous/IM Chronic systolic heart failure (HCC)/Pulmonary hypertension (HCC)/Encounter for long-term (current) use of medications - stable. Following with cardio (Dr. Tamala Julian). She is endorsing fatigue with use of the losartan only. She does have low normal pressures, and her her HR is in the 50's today. She is also on coreg (low dose BID). Advised her to discuss with her cardiologist. Given her pmh and recent echo she could use the medication benefits. Until that time, she could try cutting the losartan in half.  - cmet - CBC w/Diff - Comp Met (CMET) - Lipid panel - TSH Anxiety/Depression, unspecified depression type - stable.  - citalopram (CELEXA) 40 MG tablet; TAKE 1 TABLET (40 MG TOTAL) BY MOUTH DAILY. Follow up appointment needed for further refills.  Dispense: 90 tablet; Refill: 1 - follow up q 6 months.  Malignant neoplasm of left female breast, unspecified estrogen receptor status, unspecified site of breast (HCC)/Breast cancer screening - h/o left breast cancer (lumpectomy, radiation, tamoxifen 5 years), no mammogram for a few years - MM 3D SCREEN BREAST BILATERAL; Future Estrogen deficiency - DG Bone Density; Future Diabetes mellitus screening -  HgB A1c Encounter for health maintenance examination Patient was encouraged to exercise greater than 150 minutes a week. Patient was encouraged to choose a diet filled with fresh fruits and vegetables, and lean meats. AVS provided to patient today for education/recommendation on gender specific health and safety maintenance. Colonoscopy: completed 01/28/2015 Mammogram: completed:01/27/2014, prior h/o left breast cancer (stage IIa) lumpectomy, radiation and tamoxifen for 5 years.  Cervical cancer screening: > 65 Immunizations: tdap 03/28/2015, Influenza completed today (encouraged yearly), PNA series PNA 23 started today, shingrix script provided today (wait 4 weeks until start with 2 vaccines given today) DEXA: uncertain if she has had prior, no record.  Return in about 1 year (around 11/20/2018) for CPE.  Electronically signed by: Howard Pouch, DO [place]

## 2017-11-20 ENCOUNTER — Telehealth: Payer: Self-pay | Admitting: Family Medicine

## 2017-11-20 MED ORDER — ATORVASTATIN CALCIUM 40 MG PO TABS: 40.0000 mg | ORAL_TABLET | Freq: Every day | ORAL | 3 refills | Status: DC

## 2017-11-20 NOTE — Telephone Encounter (Signed)
Please inform patient the following information: Her labs all look great with the exception of her cholesterol. Her bad cholesterol (LDL) is 161. With her personal  CV History and her family Cardiovascular history, The american heart association recs would be to start a statin medication to help lower cholesterol and provide cardiovascular protection for heart attack or stroke. The goal for her would be ~LDL of 100. Of course diet lower in saturated fats, higher in fiber and routine exercise will also help lower cholesterol.  I have called this in for her to start.  F/U 3 mos provider appt and repeat labs after starting medicine to ensure dose is adequate.

## 2017-11-20 NOTE — Telephone Encounter (Signed)
Spoke with patient reviewed lab results and instructions. Patient verbalized understanding. Patient will try the medication she will call back to schedule an appt once she starts medication.

## 2017-11-26 LAB — HM MAMMOGRAPHY

## 2017-11-26 LAB — HM DEXA SCAN

## 2017-11-27 ENCOUNTER — Encounter: Payer: Self-pay | Admitting: Family Medicine

## 2017-11-27 ENCOUNTER — Telehealth: Payer: Self-pay | Admitting: Family Medicine

## 2017-11-27 NOTE — Telephone Encounter (Signed)
Patient notified and verbalized understanding. 

## 2017-11-27 NOTE — Telephone Encounter (Signed)
Please inform patient the following information: mammogram is normal.  

## 2017-12-03 ENCOUNTER — Encounter: Payer: Self-pay | Admitting: Family Medicine

## 2017-12-04 ENCOUNTER — Telehealth: Payer: Self-pay | Admitting: Family Medicine

## 2017-12-04 DIAGNOSIS — M8580 Other specified disorders of bone density and structure, unspecified site: Secondary | ICD-10-CM

## 2017-12-04 HISTORY — DX: Other specified disorders of bone density and structure, unspecified site: M85.80

## 2017-12-04 NOTE — Telephone Encounter (Signed)
Please inform patient the following information: Her bone density test resulted with low bone mass or softening. 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  I would recommend she try a medication called fosamax that can help slow progression. It would be a once a week medicine, she has to take on an empty stomach w/ a full glass of water and then make sure to stay upright for 30 min (no laying back down). If she is willing to start we will call this in for her.  Also weight bearing exercise and even walking on hard surface > 15 minutes a day and adequate calcium 1200 mg (as long as not told in the past to avoid) and vit d 1000u daily can help.

## 2017-12-04 NOTE — Telephone Encounter (Signed)
Left detailed message with results and instructions on patient voice mail per DPR instructed patient to return call to let us know if she is willing to try fosamax.

## 2018-03-29 IMAGING — NM NM MISC PROCEDURE
3 series · 18 of 18 positions shown · non-contrast
Comparison: none

[Series 1: stress-gsp_(id)_sa · 6.4mm · 6.40mm/px · 6 of 512 frames shown]
[frame 43/512]
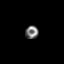
[frame 128/512]
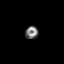
[frame 214/512]
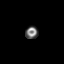
[frame 299/512]
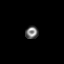
[frame 384/512]
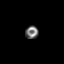
[frame 470/512]
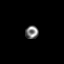

[Series 1: rest-mc_(id)_sa · 6.4mm · 6.40mm/px · 6 of 64 frames shown]
[frame 6/64]
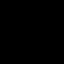
[frame 16/64]
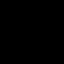
[frame 27/64]
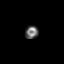
[frame 38/64]
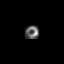
[frame 48/64]
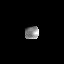
[frame 59/64]
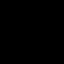

[Series 1: stress-sum-em_(id)_sa · 6.4mm · 6.40mm/px · 6 of 64 frames shown]
[frame 6/64]
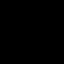
[frame 16/64]
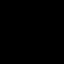
[frame 27/64]
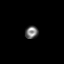
[frame 38/64]
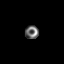
[frame 48/64]
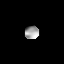
[frame 59/64]
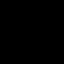

[18 of 18 positions shown; findings below may reference images not displayed]

Canned report from images found in remote index.

Refer to host system for actual result text.

## 2018-03-30 ENCOUNTER — Other Ambulatory Visit: Payer: Self-pay | Admitting: Interventional Cardiology

## 2018-05-05 ENCOUNTER — Other Ambulatory Visit: Payer: Self-pay | Admitting: Interventional Cardiology

## 2018-06-04 ENCOUNTER — Telehealth: Payer: Self-pay | Admitting: Physician Assistant

## 2018-06-04 NOTE — Telephone Encounter (Signed)
Spoke with patient who confirmed all demographics. Patient has a smart phone. I have sent her the link for My chart. Will have vitals ready for visit.    Virtual Visit Pre-Appointment Phone Call  "(Name), I am calling you today to discuss your upcoming appointment. We are currently trying to limit exposure to the virus that causes COVID-19 by seeing patients at home rather than in the office."  1. "What is the BEST phone number to call the day of the visit?" - include this in appointment notes  2. Do you have or have access to (through a family member/friend) a smartphone with video capability that we can use for your visit?" a. If yes - list this number in appt notes as cell (if different from BEST phone #) and list the appointment type as a VIDEO visit in appointment notes b. If no - list the appointment type as a PHONE visit in appointment notes  Confirm consent - "In the setting of the current Covid19 crisis, you are scheduled for a (phone or video) visit with your provider on (date) at (time).  Just as we do with many in-office visits, in order for you to participate in this visit, we must obtain consent.  If you'd like, I can send this to your mychart (if signed up) or email for you to review.  Otherwise, I can obtain your verbal consent now.  All virtual visits are billed to your insurance company just like a normal visit would be.  By agreeing to a virtual visit, we'd like you to understand that the technology does not allow for your provider to perform an examination, and thus may limit your provider's ability to fully assess your condition. If your provider identifies any concerns that need to be evaluated in person, we will make arrangements to do so.  Finally, though the technology is pretty good, we cannot assure that it will always work on either your or our end, and in the setting of a video visit, we may have to convert it to a phone-only visit.  In either situation, we cannot ensure  that we have a secure connection.  Are you willing to proceed?"  Patient said "yes".  3. Advise patient to be prepared - "Two hours prior to your appointment, go ahead and check your blood pressure, pulse, oxygen saturation, and your weight (if you have the equipment to check those) and write them all down. When your visit starts, your provider will ask you for this information. If you have an Apple Watch or Kardia device, please plan to have heart rate information ready on the day of your appointment. Please have a pen and paper handy nearby the day of the visit as well."  4. Give patient instructions for MyChart download to smartphone OR [email or web address] as below if video visit (depending on what platform provider is using)  5. Inform patient they will receive a phone call 15 minutes prior to their appointment time (may be from unknown caller ID) so they should be prepared to answer    TELEPHONE CALL NOTE  Janika Jedlicka [place] has been deemed a candidate for a follow-up tele-health visit to limit community exposure during the Covid-19 pandemic. I spoke with the patient via phone to ensure availability of phone/video source, confirm preferred email & phone number, and discuss instructions and expectations.  I reminded Cire Deyarmin Kuenzel to be prepared with any vital sign and/or heart rhythm information that could potentially be obtained via home monitoring, at  the time of her visit. I reminded Kristyanna Barcelo [place] to expect a phone call prior to her visit.  Ardelle Anton 06/04/2018 4:09 PM   INSTRUCTIONS FOR DOWNLOADING THE MYCHART APP TO SMARTPHONE  - The patient must first make sure to have activated MyChart and know their login information - If Apple, go to [employer] and type in MyChart in the search bar and download the app. If Android, ask patient to go to [employer] and type in [place] in the search bar and download the app. The app is free but as with any other app downloads, their  phone may require them to verify saved payment information or Apple/Android password.  - The patient will need to then log into the app with their MyChart username and password, and select [facility] as their healthcare provider to link the account. When it is time for your visit, go to the MyChart app, find appointments, and click Begin Video Visit. Be sure to Select Allow for your device to access the Microphone and Camera for your visit. You will then be connected, and your provider will be with you shortly.  **If they have any issues connecting, or need assistance please contact MyChart service desk [phone])**  **If using a computer, in order to ensure the best quality for their visit they will need to use either of the following Internet Browsers: [employer], or Google Chrome**  IF USING DOXIMITY or DOXY.ME - The patient will receive a link just prior to their visit by text.     FULL LENGTH CONSENT FOR TELE-HEALTH VISIT   I hereby voluntarily request, consent and authorize [facility] and its employed or contracted physicians, physician assistants, nurse practitioners or other licensed health care professionals (the Practitioner), to provide me with telemedicine health care services (the Services") as deemed necessary by the treating Practitioner. I acknowledge and consent to receive the Services by the Practitioner via telemedicine. I understand that the telemedicine visit will involve communicating with the Practitioner through live audiovisual communication technology and the disclosure of certain medical information by electronic transmission. I acknowledge that I have been given the opportunity to request an in-person assessment or other available alternative prior to the telemedicine visit and am voluntarily participating in the telemedicine visit.  I understand that I have the right to withhold or withdraw my consent to the use of telemedicine in the course of  my care at any time, without affecting my right to future care or treatment, and that the Practitioner or I may terminate the telemedicine visit at any time. I understand that I have the right to inspect all information obtained and/or recorded in the course of the telemedicine visit and may receive copies of available information for a reasonable fee.  I understand that some of the potential risks of receiving the Services via telemedicine include:   Delay or interruption in medical evaluation due to technological equipment failure or disruption;  Information transmitted may not be sufficient (e.g. poor resolution of images) to allow for appropriate medical decision making by the Practitioner; and/or   In rare instances, security protocols could fail, causing a breach of personal health information.  Furthermore, I acknowledge that it is my responsibility to provide information about my medical history, conditions and care that is complete and accurate to the best of my ability. I acknowledge that Practitioner's advice, recommendations, and/or decision may be based on factors not within their control, such as incomplete or inaccurate data  provided by me or distortions of diagnostic images or specimens that may result from electronic transmissions. I understand that the practice of medicine is not an exact science and that Practitioner makes no warranties or guarantees regarding treatment outcomes. I acknowledge that I will receive a copy of this consent concurrently upon execution via email to the email address I last provided but may also request a printed copy by calling the office of [facility].    I understand that my insurance will be billed for this visit.   I have read or had this consent read to me.  I understand the contents of this consent, which adequately explains the benefits and risks of the Services being provided via telemedicine.   I have been provided ample opportunity to ask  questions regarding this consent and the Services and have had my questions answered to my satisfaction.  I give my informed consent for the services to be provided through the use of telemedicine in my medical care  By participating in this telemedicine visit I agree to the above.

## 2018-06-13 NOTE — Progress Notes (Signed)
Virtual Visit via Video Note   This visit type was conducted due to national recommendations for restrictions regarding the COVID-19 Pandemic (e.g. social distancing) in an effort to limit this patient's exposure and mitigate transmission in our community.  Due to her co-morbid illnesses, this patient is at least at moderate risk for complications without adequate follow up.  This format is felt to be most appropriate for this patient at this time.  All issues noted in this document were discussed and addressed.  A limited physical exam was performed with this format.  Please refer to the patient's chart for her consent to telehealth for [facility].   Date:  06/14/2018   ID:  Grace Fletcher, DOB 12-24-1940, MRN [id number]  Patient Location: Home Provider Location: Home  PCP:  Ma Hillock, DO  Cardiologist:  Sinclair Grooms, MD  Electrophysiologist:  None   Evaluation Performed:  Follow-Up Visit  Chief Complaint: Follow-up  History of Present Illness:    Grace Fletcher is a 78 y.o. female with pulmonic stenosis, prior percutaneous ASD repair, mild dilation of the right ventricle and chronic systolic CHF seen for follow up.   Echocardiogram done 09/2015 to evaluate pulmonic valve and right ventricular function suggested decreased LVEF in the 30-40 range. This led to initiation of guide line mandated heart failure therapy. Follow up stress test 02/2016  was low risk study, no ischemia and demonstrated LVEF of 57'%.  Last echo 05/01/2017 showed improved LVEF of 40-45%, grade 1 DD, mild MR, mild pulmonic stenosis.   She did not tolerated metoprolol (Bilateral feet and ankle swelling) and placed on coreg.   Today patient reports taking Coreg only once a day.  She states that she is missing due to taking care of his husband who has severe medical condition.  Under a lot of stress.  She has a chronic shortness of breath, recently worsened.  She attributes this to deconditioning.   She denies chest pain, palpitation, orthopnea or PND.  Intermittent ankle edema which improves with rest and leg elevation.  The patient does not have symptoms concerning for COVID-19 infection (fever, chills, cough, or new shortness of breath).    Past Medical History:  Diagnosis Date  . Anxiety 05/26/2013  . Arthritis   . Breast cancer, left breast ([place]) 2006   Lumpectomy, radiation and tamoxifen for 5 years.  . Colon polyp   . Cyst of pancreas   . Depression 05/26/2013  . Migraines   . Moderate tricuspid regurgitation by prior echocardiogram 05/13/2010  . Pulmonary hypertension ([place]) 05/13/2010  . Pulmonary stenosis 05/13/2010   Study Conclusions  - Left ventricle: The cavity size was normal. Systolic function was mildly reduced. The estimated ejection fraction was in the range of 45% to 50%. There is hypokinesis of the anteroseptal myocardium. Doppler parameters are consistent with abnormal left ventricular relaxation (grade 1 diastolic dysfunction). - Right ventricle: The cavity size was mildly dilated. Wall thickness was normal. Systolic function was mildly reduced. - Right atrium: The atrium was mildly dilated. - Atrial septum: No atrial level shunt, at baseline with Doppler analysis. Prior ASD repair, CardioSeal 2004. - Pulmonic valve: Transvalvular velocity was increased, due to mild degree of stenosis. Peak velocity: 224cm/s (S). - Pulmonary arteries: Systolic pressure was mildly increased.    . 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   CardioSEAL device, 2004; [facility]    Past Surgical History:  Procedure Laterality Date  . BREAST LUMPECTOMY  left breast cancer, status post lumpectomy, radiation and tamoxifen 5 years  . CARPAL TUNNEL RELEASE    . CESAREAN SECTION     x4  . SENTINEL LYMPH NODE BIOPSY       Current Meds  Medication Sig  . aspirin 81 MG tablet Take 81 mg by mouth daily.    Marland Kitchen atorvastatin (LIPITOR) 40 MG  tablet Take 1 tablet (40 mg total) by mouth daily.  . carvedilol (COREG) 3.125 MG tablet TAKE 1 TABLET (3.125 MG TOTAL) BY MOUTH 2 (TWO) TIMES DAILY. (Patient taking differently: Take 3.125 mg by mouth daily. )  . Cholecalciferol (VITAMIN D) 1000 UNITS capsule Take 1,000 Units by mouth daily.  . citalopram (CELEXA) 40 MG tablet TAKE 1 TABLET (40 MG TOTAL) BY MOUTH DAILY. Follow up appointment needed for further refills.  [name] Calcium (STOOL SOFTENER PO) Take 1 capsule by mouth as needed.   Marland Kitchen losartan (COZAAR) 25 MG tablet TAKE 1 TABLET BY MOUTH EVERY DAY  . Multiple Vitamin (MULTIVITAMIN) capsule Take 1 capsule by mouth daily.    . RESTASIS 0.05 % ophthalmic emulsion INSTILL 1 DROP INTO BOTH EYES TWICE A DAY     Allergies:   Patient has no known allergies.   Social History   Tobacco Use  . Smoking status: Former Research scientist (life sciences)  . Smokeless tobacco: Never Used  Substance Use Topics  . Alcohol use: No  . Drug use: No     Family Hx: The patient's family history includes Arthritis in her mother; Dementia in her mother; Heart attack in her father and paternal grandmother; Heart disease in her father and paternal grandmother; Hypertension in her sister.  ROS:   Please see the history of present illness.   = All other systems reviewed and are negative.   Prior CV studies:   The following studies were reviewed today:  Echo 04/2017 Study Conclusions  - Left ventricle: The cavity size was normal. Systolic function was   mildly to moderately reduced. The estimated ejection fraction was   in the range of 40% to 45%. Hypokinesis of the anteroseptal   myocardium. Doppler parameters are consistent with abnormal left   ventricular relaxation (grade 1 diastolic dysfunction). - Aortic valve: Transvalvular velocity was within the normal range.   There was no stenosis. There was no regurgitation. - Mitral valve: Transvalvular velocity was within the normal range.   There was no evidence for  stenosis. There was mild regurgitation. - Left atrium: The atrium was mildly dilated. - Right ventricle: The cavity size was normal. Wall thickness was   normal. Systolic function was normal. - Atrial septum: No defect or patent foramen ovale was identified   by color flow Doppler. - Tricuspid valve: There was mild regurgitation. - Pulmonic valve: The findings are consistent with mild stenosis.   Peak gradient (S): 18 mm Hg. - Pulmonary arteries: Systolic pressure was within the normal   range. PA peak pressure: 24 mm Hg (S).   Labs/Other Tests and Data Reviewed:    EKG:    Recent Labs: 11/19/2017: ALT 12; BUN 13; Creatinine, Ser 0.71; Hemoglobin 14.9; Platelets 190.0; Potassium 4.1; Sodium 138; TSH 2.92   Recent Lipid Panel Lab Results  Component Value Date/Time   CHOL 248 (H) 11/19/2017 09:59 AM   TRIG 100.0 11/19/2017 09:59 AM   HDL 66.60 11/19/2017 09:59 AM   CHOLHDL 4 11/19/2017 09:59 AM   LDLCALC 161 (H) 11/19/2017 09:59 AM    Wt Readings from Last 3 Encounters:  06/14/18 148 lb (67.1 kg)  11/19/17 157 lb 12.8 oz (71.6 kg)  04/27/17 156 lb 3.2 oz (70.9 kg)     Objective:    Vital Signs:  BP (!) 104/58   Pulse (!) 57   Ht 5' 4.5" (1.638 m)   Wt 148 lb (67.1 kg)   BMI 25.01 kg/m    VITAL SIGNS:  reviewed GEN:  no acute distress EYES:  sclerae anicteric, EOMI - Extraocular Movements Intact RESPIRATORY:  normal respiratory effort, symmetric expansion CARDIOVASCULAR:  no peripheral edema SKIN:  no rash, lesions or ulcers. MUSCULOSKELETAL:  no obvious deformities. NEURO:  alert and oriented x 3, no obvious focal deficit PSYCH:  normal affect  ASSESSMENT & PLAN:    1. 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  Encouraged compliance with Coreg BID. Continue Losartan.   2. S/p closer of ASD - no recurrent ASD or shunt on last echo  3.DOE - Patient had chronic dyspnea with exertion.  Might be worse in the past few months.   However, she attributes this to deconditioning and taking care of his husband who has severe medical condition.  Discussed further evaluation with either stress test or echocardiogram.  Patient wants to wait and watch and will consider during next office visit.   COVID-19 Education: The signs and symptoms of COVID-19 were discussed with the patient and how to seek care for testing (follow up with PCP or arrange E-visit). The importance of social distancing was discussed today.  Time:   Today, I have spent 12 minutes with the patient with telehealth technology discussing the above problems.     Medication Adjustments/Labs and Tests Ordered: Current medicines are reviewed at length with the patient today.  Concerns regarding medicines are outlined above.   Tests Ordered: No orders of the defined types were placed in this encounter.   Medication Changes: No orders of the defined types were placed in this encounter.   Disposition:  Follow up in 6 month(s)  Signed, Leanor Kail, PA  06/14/2018 11:24 AM    [facility]

## 2018-06-14 ENCOUNTER — Encounter: Payer: Self-pay | Admitting: Physician Assistant

## 2018-06-14 ENCOUNTER — Other Ambulatory Visit: Payer: Self-pay

## 2018-06-14 ENCOUNTER — Telehealth (INDEPENDENT_AMBULATORY_CARE_PROVIDER_SITE_OTHER): Payer: Medicare Other | Admitting: Physician Assistant

## 2018-06-14 VITALS — BP 104/58 | HR 57 | Ht 64.5 in | Wt 148.0 lb

## 2018-06-14 DIAGNOSIS — I5022 Chronic systolic (congestive) heart failure: Secondary | ICD-10-CM

## 2018-06-14 DIAGNOSIS — R0609 Other forms of dyspnea: Secondary | ICD-10-CM

## 2018-06-14 DIAGNOSIS — Z8774 Personal history of (corrected) congenital malformations of heart and circulatory system: Secondary | ICD-10-CM

## 2018-06-14 NOTE — Patient Instructions (Signed)
Signed        Medication Instructions:   Your physician recommends that you continue on your current medications as directed. Please refer to the Current Medication list given to you today.   If you need a refill on your cardiac medications before your next appointment, please call your pharmacy.   Lab work:NONE ORDERED  TODAY   If you have labs (blood work) drawn today and your tests are completely normal, you will receive your results only by:  [place] (if you have MyChart) OR  A paper copy in the mail If you have any lab test that is abnormal or we need to change your treatment, we will call you to review the results.  Testing/Procedures: NONE ORDERED  TODAY    Follow-Up: At [facility], you and your health needs are our priority. As part of our continuing mission to provide you with exceptional heart care, we have created designated Provider Care Teams. These Care Teams include your primary Cardiologist (physician) and Advanced Practice Providers (APPs -  Physician Assistants and Nurse Practitioners) who all work together to provide you with the care you need, when you need it.  You will need a follow up appointment in 6 months.  Please call our office 2 months in advance to schedule this appointment.  You may see Sinclair Grooms, MD or one of the following Advanced Practice Providers on your designated Care Team:    Truitt Merle, NP  Cecilie Kicks, NP  Kathyrn Drown, NP  Any Other Special Instructions Will Be Listed Below (If Applicable).

## 2018-06-14 NOTE — Progress Notes (Signed)
Medication Instructions:  \Your physician recommends that you continue on your current medications as directed. Please refer to the Current Medication list given to you today.  If you need a refill on your cardiac medications before your next appointment, please call your pharmacy.   Lab work: NONE ORDERED  TODAY   If you have labs (blood work) drawn today and your tests are completely normal, you will receive your results only by: . MyChart Message (if you have MyChart) OR . A paper copy in the mail If you have any lab test that is abnormal or we need to change your treatment, we will call you to review the results.  Testing/Procedures: NONE ORDERED  TODAY    Follow-Up: At [facility], you and your health needs are our priority.  As part of our continuing mission to provide you with exceptional heart care, we have created designated Provider Care Teams.  These Care Teams include your primary Cardiologist (physician) and Advanced Practice Providers (APPs -  Physician Assistants and Nurse Practitioners) who all work together to provide you with the care you need, when you need it. You will need a follow up appointment in 6 months.  Please call our office 2 months in advance to schedule this appointment.  You may see Henry W Smith III, MDor one of the following Advanced Practice Providers on your designated Care Team:   Lori Gerhardt, NP Laura Ingold, NP . Jill McDaniel, NP  Any Other Special Instructions Will Be Listed Below (If Applicable).    

## 2018-06-22 ENCOUNTER — Other Ambulatory Visit: Payer: Self-pay | Admitting: Interventional Cardiology

## 2018-07-14 ENCOUNTER — Other Ambulatory Visit: Payer: Self-pay | Admitting: Family Medicine

## 2018-07-14 DIAGNOSIS — F32A Depression, unspecified: Secondary | ICD-10-CM

## 2018-07-14 DIAGNOSIS — F419 Anxiety disorder, unspecified: Secondary | ICD-10-CM

## 2018-07-14 DIAGNOSIS — F329 Major depressive disorder, single episode, unspecified: Secondary | ICD-10-CM

## 2018-07-14 NOTE — Telephone Encounter (Signed)
Please call patient. I have received refill request for pts celexa. pt is due for 6 mos follow up. Please schedule ASAP so she does not run of medicine.   Appt can be virtual if she desires, but we also need to recheck her cholesterol so she would need to still come in for labs.

## 2018-07-19 NOTE — Telephone Encounter (Signed)
Pt was called and scheduled for appt to obtain refills and get labs drawn

## 2018-07-21 ENCOUNTER — Other Ambulatory Visit: Payer: Self-pay

## 2018-07-21 ENCOUNTER — Ambulatory Visit (INDEPENDENT_AMBULATORY_CARE_PROVIDER_SITE_OTHER): Payer: Medicare Other | Admitting: Family Medicine

## 2018-07-21 ENCOUNTER — Encounter: Payer: Self-pay | Admitting: Family Medicine

## 2018-07-21 VITALS — BP 110/71 | HR 59 | Temp 97.7°F | Resp 18 | Ht 64.0 in | Wt 159.0 lb

## 2018-07-21 DIAGNOSIS — E785 Hyperlipidemia, unspecified: Secondary | ICD-10-CM | POA: Diagnosis not present

## 2018-07-21 DIAGNOSIS — F329 Major depressive disorder, single episode, unspecified: Secondary | ICD-10-CM | POA: Diagnosis not present

## 2018-07-21 DIAGNOSIS — I5022 Chronic systolic (congestive) heart failure: Secondary | ICD-10-CM

## 2018-07-21 DIAGNOSIS — Z8774 Personal history of (corrected) congenital malformations of heart and circulatory system: Secondary | ICD-10-CM

## 2018-07-21 DIAGNOSIS — F32A Depression, unspecified: Secondary | ICD-10-CM

## 2018-07-21 DIAGNOSIS — E663 Overweight: Secondary | ICD-10-CM

## 2018-07-21 DIAGNOSIS — F419 Anxiety disorder, unspecified: Secondary | ICD-10-CM

## 2018-07-21 DIAGNOSIS — Z79899 Other long term (current) drug therapy: Secondary | ICD-10-CM | POA: Diagnosis not present

## 2018-07-21 DIAGNOSIS — I272 Pulmonary hypertension, unspecified: Secondary | ICD-10-CM

## 2018-07-21 DIAGNOSIS — I071 Rheumatic tricuspid insufficiency: Secondary | ICD-10-CM

## 2018-07-21 DIAGNOSIS — I370 Nonrheumatic pulmonary valve stenosis: Secondary | ICD-10-CM

## 2018-07-21 LAB — COMPREHENSIVE METABOLIC PANEL
ALT: 17 U/L (ref 0–35)
AST: 24 U/L (ref 0–37)
Albumin: 4.1 g/dL (ref 3.5–5.2)
Alkaline Phosphatase: 65 U/L (ref 39–117)
BUN: 10 mg/dL (ref 6–23)
CO2: 27 mEq/L (ref 19–32)
Calcium: 9.2 mg/dL (ref 8.4–10.5)
Chloride: 106 mEq/L (ref 96–112)
Creatinine, Ser: 0.63 mg/dL (ref 0.40–1.20)
GFR: 91.78 mL/min (ref >60.00)
Glucose, Bld: 87 mg/dL (ref 70–99)
Potassium: 4.2 mEq/L (ref 3.5–5.1)
Sodium: 140 mEq/L (ref 135–145)
Total Bilirubin: 0.8 mg/dL (ref 0.2–1.2)
Total Protein: 6.3 g/dL (ref 6.0–8.3)

## 2018-07-21 LAB — LIPID PANEL
Cholesterol: 144 mg/dL (ref 0–200)
HDL: 63.6 mg/dL (ref >39.00)
LDL Cholesterol: 70 mg/dL (ref 0–99)
NonHDL: 80.72
Total CHOL/HDL Ratio: 2
Triglycerides: 55 mg/dL (ref 0.0–149.0)
VLDL: 11 mg/dL (ref 0.0–40.0)

## 2018-07-21 MED ORDER — ATORVASTATIN CALCIUM 40 MG PO TABS: 40.0000 mg | ORAL_TABLET | Freq: Every day | ORAL | 1 refills | Status: DC

## 2018-07-21 MED ORDER — CITALOPRAM HYDROBROMIDE 40 MG PO TABS: ORAL_TABLET | ORAL | 1 refills | Status: DC

## 2018-07-21 NOTE — Patient Instructions (Signed)
We will collect labs today and call you with the results.  I refilled your medications.  If you are short of breath with activity and sweating more- this can be a sign of a heart conditions... I would suggest you call your cardiologists to have the studies completed they spoke of at your last appt.   It was nice to see you today.  Follow up just before christmas     Shortness of Breath, Adult Shortness of breath means you have trouble breathing. Shortness of breath could be a sign of a medical problem. Follow these instructions at home:   Watch for any changes in your symptoms.  Do not use any products that contain nicotine or tobacco, such as cigarettes, e-cigarettes, and chewing tobacco.  Do not smoke. Smoking can cause shortness of breath. If you need help to quit smoking, ask your doctor.  Avoid things that can make it harder to breathe, such as: ? Mold. ? Dust. ? Air pollution. ? Chemical smells. ? Things that can cause allergy symptoms (allergens), if you have allergies.  Keep your living space clean. Use products that help remove mold and dust.  Rest as needed. Slowly return to your normal activities.  Take over-the-counter and prescription medicines only as told by your doctor. This includes oxygen therapy and inhaled medicines.  Keep all follow-up visits as told by your doctor. This is important. Contact a doctor if:  Your condition does not get better as soon as expected.  You have a hard time doing your normal activities, even after you rest.  You have new symptoms. Get help right away if:  Your shortness of breath gets worse.  You have trouble breathing when you are resting.  You feel light-headed or you pass out (faint).  You have a cough that is not helped by medicines.  You cough up blood.  You have pain with breathing.  You have pain in your chest, arms, shoulders, or belly (abdomen).  You have a fever.  You cannot walk up stairs.  You  cannot exercise the way you normally do. These symptoms may represent a serious problem that is an emergency. Do not wait to see if the symptoms will go away. Get medical help right away. Call your local emergency services (911 in the [place] Do not drive yourself to the hospital. Summary  Shortness of breath is when you have trouble breathing enough air. It can be a sign of a medical problem.  Avoid things that make it hard for you to breathe, such as smoking, pollution, mold, and dust.  Watch for any changes in your symptoms. Contact your doctor if you do not get better or you get worse. This information is not intended to replace advice given to you by your health care provider. Make sure you discuss any questions you have with your health care provider. Document Released: 07/02/2007 Document Revised: 06/15/2017 Document Reviewed: 06/15/2017 Elsevier Interactive Patient Education  2019 [employer].

## 2018-07-21 NOTE — Progress Notes (Signed)
[name], female  DOB: [birth year], 78 y.o.   MRN: [id number] Patient Care Team    Relationship Specialty Notifications Start End  Ma Hillock, DO PCP - General Family Medicine  03/03/17   Belva Crome, MD PCP - Cardiology Cardiology Admissions 05/07/17   Magrinat, Virgie Dad, MD Consulting Physician Oncology  03/03/17   Renelda Loma, OD  Optometry  03/03/17   Dian Queen, MD Consulting Physician Obstetrics and Gynecology  03/03/17     Chief Complaint  Patient presents with  . Hypertension    Pt complains of having hot flashes during the day. This is new and she never had them before.  Needs refills.   . Anxiety    Subjective:  Grace Fletcher is a 78 y.o.  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  She reports compliance losartan 25 mg daily and coreg 3.125 BID. She is established with cardiology. Blood pressures ranges at home are normal. Patient denies chest pain or lower extremity edema. She does admit to more shortness of breath with activity and profusely sweating with activity only. The sweating is new and worsening over the last year. It is only with activity. She has had some increase in fatigue she has contributed to other life events. She denies night sweats, flushing, unintentional weight loss, diarrhea or constipation. Pt takes daily baby ASA. Pt is  prescribed statin.Her last echo was 4/19. She had a recent appt with her cardiologist and they had told her to follow up if shortness of breath worsened. They offered her stress test and echo and she wanted to wait.  BMP: 07/21/2018 within normal limits CBC: 11/19/2017 within normal limits Lipids: 07/21/2018 within normal limits TSH 11/19/2017 within normal limits Diet: Low-sodium  Exercise: Routine exercise RF: Pulmonary hypertension, family history heart disease, hyperlipidemia  Depression: Patient reports she was started on Celexa many years ago when she was diagnosed with breast cancer. She had discontinued until her son had become incarcerated. Celexa 40 mg dose still is working well for her, and she was like to continue medication.  Depression screen [facility] 2/9 07/21/2018 11/19/2017 03/03/2017  Decreased Interest 0 0 0  Down, Depressed, Hopeless 0 0 1  PHQ - 2 Score 0 0 1  Altered sleeping 0 0 0  Tired, decreased energy _0 Change in appetite 0 0 1  Feeling bad or failure about yourself  0 0 0  Trouble concentrating 0 0 0  Moving slowly or fidgety/restless 0 0 0  Suicidal thoughts 0 0 0  PHQ-9 Score _1 Difficult doing work/chores Not difficult at all Not difficult at all -   GAD 7 : Generalized Anxiety Score 07/21/2018 03/03/2017  Nervous, Anxious, on Edge 0 0  Control/stop worrying 0 0  Worry too much - different things 1 0  Trouble relaxing 0 0  Restless 0 0  Easily annoyed or irritable 0 0  Afraid - awful might happen 0 0  Total GAD 7 Score 1 0  Anxiety Difficulty Not difficult at all -     Fall Risk  11/19/2017 03/03/2017  Falls in the past year? No No    Immunization History  Administered Date(s) Administered  . DTaP 04/13/2015  . Influenza, High Dose Seasonal PF 11/27/2016, 11/19/2017  . Pneumococcal Polysaccharide-23  11/19/2017   No exam data present  Past Medical History:  Diagnosis Date  . Anxiety 05/26/2013  . Arthritis   . Breast cancer, left breast ([place]) 2006   Lumpectomy, radiation and tamoxifen for 5 years.  . Colon polyp   . Cyst of pancreas   . Depression 05/26/2013  . Migraines   . Moderate tricuspid regurgitation by prior echocardiogram 05/13/2010  . Pulmonary hypertension ([place]) 05/13/2010  . Pulmonary stenosis 05/13/2010   Study Conclusions  - Left ventricle: The cavity size was normal. Systolic function was mildly  reduced. The estimated ejection fraction was in the range of 45% to 50%. There is hypokinesis of the anteroseptal myocardium. Doppler parameters are consistent with abnormal left ventricular relaxation (grade 1 diastolic dysfunction). - Right ventricle: The cavity size was mildly dilated. Wall thickness was normal. Systolic function was mildly reduced. - Right atrium: The atrium was mildly dilated. - Atrial septum: No atrial level shunt, at baseline with Doppler analysis. Prior ASD repair, CardioSeal 2004. - Pulmonic valve: Transvalvular velocity was increased, due to mild degree of stenosis. Peak velocity: 224cm/s (S). - Pulmonary arteries: Systolic pressure was mildly increased.    . 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 BREAST LUMPECTOMY     left breast cancer, status post lumpectomy, radiation and tamoxifen 5 years  . CARPAL TUNNEL RELEASE    . CESAREAN SECTION     x4  . SENTINEL LYMPH NODE BIOPSY     Family History  Problem Relation Age of Onset  . Dementia Mother   . Arthritis Mother   . Heart attack Father   . Heart disease Father   . Hypertension Sister   . Heart attack Paternal Grandmother   . Heart disease Paternal Grandmother    Social History   Socioeconomic History  . Marital status: Married    Spouse name: Not on file  . Number of children: 4  . Years of education: Not on file  . Highest education level: Not on file  Occupational History  . Occupation: retired  Scientific laboratory technician  . Financial resource strain: Not on file  . Food insecurity    Worry: Not on file    Inability: Not on file  . Transportation needs    Medical: Not on file    Non-medical: Not on file  Tobacco Use  . Smoking status: Former Research scientist (life sciences)  . Smokeless tobacco: Never Used  Substance and Sexual Activity  . Alcohol use: No  . Drug use:  No  . Sexual activity: Yes    Partners: Male    Birth control/protection: Post-menopausal  Lifestyle  . Physical activity    Days per week: Not on file    Minutes per session: Not on file  . Stress: Not on file  Relationships  . Social Herbalist on phone: Not on file    Gets together: Not on file    Attends religious service: Not on file    Active member of club or organization: Not on file    Attends meetings of clubs or organizations: Not on file    Relationship status: Not on file  . Intimate partner violence    Fear of current or ex partner: Not on file    Emotionally abused: Not on file    Physically abused: Not on file    Forced sexual  activity: Not on file  Other Topics Concern  . Not on file  Social History Narrative   Married. 4 children.    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   Takes a daily vitamin.   Smoke alarm in the home.   Wears her seatbelt.   Feels safe in her relationships.   Allergies as of 07/21/2018   No Known Allergies     Medication List       Accurate as of July 21, 2018 11:59 PM. If you have any questions, ask your nurse or doctor.        aspirin 81 MG tablet Take 81 mg by mouth daily.   atorvastatin 40 MG tablet Commonly known as: LIPITOR Take 1 tablet (40 mg total) by mouth daily.   carvedilol 3.125 MG tablet Commonly known as: COREG TAKE 1 TABLET (3.125 MG TOTAL) BY MOUTH 2 (TWO) TIMES DAILY.   citalopram 40 MG tablet Commonly known as: CELEXA TAKE 1 TABLET (40 MG TOTAL) BY MOUTH DAILY. Follow up appointment needed for further refills.   losartan 25 MG tablet Commonly known as: COZAAR TAKE 1 TABLET BY MOUTH EVERY DAY   multivitamin capsule Take 1 capsule by mouth daily.   Restasis 0.05 % ophthalmic emulsion Generic drug: cycloSPORINE INSTILL 1 DROP INTO BOTH EYES TWICE A DAY   STOOL SOFTENER PO Take 1 capsule by mouth as needed.   Vitamin D 1000 units capsule Take 1,000 Units by mouth daily.      All  past medical history, surgical history, allergies, family history, immunizations andmedications were updated in the EMR today and reviewed under the history and medication portions of their EMR.    Recent Results (from the past 2160 hour(s))  Comp Met (CMET)     Status: None   Collection Time: 07/21/18 10:39 AM  Result Value Ref Range   Sodium 140 135 - 145 mEq/L   Potassium 4.2 3.5 - 5.1 mEq/L   Chloride 106 96 - 112 mEq/L   CO2 27 19 - 32 mEq/L   Glucose, Bld 87 70 - 99 mg/dL   BUN 10 6 - 23 mg/dL   Creatinine, Ser 0.63 0.40 - 1.20 mg/dL   Total Bilirubin 0.8 0.2 - 1.2 mg/dL   Alkaline Phosphatase 65 39 - 117 U/L   AST 24 0 - 37 U/L   ALT 17 0 - 35 U/L   Total Protein 6.3 6.0 - 8.3 g/dL   Albumin 4.1 3.5 - 5.2 g/dL   Calcium 9.2 8.4 - 10.5 mg/dL   GFR 91.78 >60.00 mL/min  Lipid panel     Status: None   Collection Time: 07/21/18 10:39 AM  Result Value Ref Range   Cholesterol 144 0 - 200 mg/dL    Comment: ATP III Classification       Desirable:  < 200 mg/dL               Borderline High:  200 - 239 mg/dL          High:  > = 240 mg/dL   Triglycerides 55.0 0.0 - 149.0 mg/dL    Comment: Normal:  <150 mg/dLBorderline High:  150 - 199 mg/dL   HDL 63.60 >39.00 mg/dL   VLDL 11.0 0.0 - 40.0 mg/dL   LDL Cholesterol 70 0 - 99 mg/dL   Total CHOL/HDL Ratio 2     Comment:                Men  Women1/2 Average Risk     3.4          3.3Average Risk          5.0          4.42X Average Risk          9.6          7.13X Average Risk          15.0          11.0                       NonHDL 80.72     Comment: NOTE:  Non-HDL goal should be 30 mg/dL higher than patient's LDL goal (i.e. LDL goal of < 70 mg/dL, would have non-HDL goal of < 100 mg/dL)    ROS: 14 pt review of systems performed and negative (unless mentioned in an HPI)  Objective: BP 110/71 (BP Location: Left Arm, Patient Position: Sitting, Cuff Size: Normal)   Pulse (!) 59   Temp 97.7 F (36.5 C) (Temporal)   Resp 18   Ht 5'  4" (1.626 m)   Wt 159 lb (72.1 kg)   SpO2 97%   BMI 27.29 kg/m  Gen: Afebrile. No acute distress.  HENT: AT. [place]. MMM. Eyes:Pupils Equal Round Reactive to light, Extraocular movements intact,  Conjunctiva without redness, discharge or icterus. Neck/lymp/endocrine: Supple,no lymphadenopathy, no thyromegaly CV: RRR 2/6 SM, no edema, +2/4 P posterior tibialis pulses Chest: CTAB, no wheeze or crackles Abd: Soft. NTND. BS present. no Masses palpated.  Neuro: Normal gait. PERLA. EOMi. Alert. Oriented. Psych: Normal affect, dress and demeanor. Normal speech. Normal thought content and judgment.  Assessment/plan: Grace Fletcher is a 78 y.o. female present for establish care. Pulmonary hypertension (HCC)/chronic systolic heart failure/hyperlipidemia/overweight/moderate tricuspid regurgitation/on statin therapy/pulmonary stenosis/status post device closure of ASD -Stable today.  Although she does report more increased in shortness of breath with activity only.  I strongly encouraged her to follow-up with her cardiologist.  In the last note she had mentioned this in the offered stress test/echocardiogram and she wanted to wait.  I do feel that this is possibly secondary to her heart condition given her level of reported diaphoresis and shortness of breath.  She reports understanding. - Continue losartan 25 mg daily.>>  Provided by cardiology -Continue atorvastatin 40 mg daily.>>>  Refills provided -Continue Coreg 3.125 twice daily.>>>  Provided by cardiology -Continue baby aspirin daily. - Last ejection fraction within normal range.  Depression, unspecified depression type/anxiety -Stable today. Refills on Celexa provided - citalopram (CELEXA) 40 MG tablet; TAKE 1 TABLET (40 MG TOTAL) BY MOUTH DAILY.  Dispense: 90 tablet; Refill: 1 - TSH- NL - Follow-up 6 months.  Return in about 6 months (around 01/10/2019).  > 25 minutes spent with patient, >50% of time spent face to face     Note is  dictated utilizing voice recognition software. Although note has been proof read prior to signing, occasional typographical errors still can be missed. If any questions arise, please do not hesitate to call for verification.  Electronically signed by: Howard Pouch, DO [place]

## 2018-07-23 ENCOUNTER — Encounter: Payer: Self-pay | Admitting: Family Medicine

## 2018-07-23 DIAGNOSIS — E785 Hyperlipidemia, unspecified: Secondary | ICD-10-CM | POA: Insufficient documentation

## 2018-07-23 DIAGNOSIS — Z79899 Other long term (current) drug therapy: Secondary | ICD-10-CM | POA: Insufficient documentation

## 2018-10-28 ENCOUNTER — Other Ambulatory Visit: Payer: Self-pay | Admitting: Interventional Cardiology

## 2018-11-22 ENCOUNTER — Encounter: Payer: Medicare Other | Admitting: Family Medicine

## 2018-12-01 LAB — HM MAMMOGRAPHY

## 2018-12-02 ENCOUNTER — Telehealth: Payer: Self-pay

## 2018-12-02 NOTE — Telephone Encounter (Signed)
Pt was called and detailed message was left on VM giving patient her results

## 2018-12-02 NOTE — Telephone Encounter (Signed)
[facility] mammogram report- Negative- Bi-RAD  Placed on Dr Dierdre Highman desk to review. Abstracted into chart.

## 2018-12-02 NOTE — Telephone Encounter (Signed)
Please advise patient her mammogram is normal.

## 2018-12-17 NOTE — Progress Notes (Signed)
CARDIOLOGY OFFICE NOTE  Date:  12/20/2018    Grace Fletcher Date of Birth: 1940/12/14 Medical Record [id number]  PCP:  Ma Hillock, DO  Cardiologist:  Tamala Julian   Chief Complaint  Patient presents with  . Follow-up    Seen for Dr. Tamala Julian    History of Present Illness: Grace Fletcher is a 78 y.o. female who presents today for a 6 month check. Seen for Dr. Tamala Julian.   She has a history of pulmonic stenosis, prior percutaneous ASD repair, mid dilatation of the RV and chronic systolic HF.   Last seen in April of 2019 by Dr. Tamala Julian.   She had a telehealth visit with Vin back in May of 2020. Was only taking Coreg once a day. Lots of stress with caregiver role for her husband - Richard. More shortness of breath that she attributed to deconditioning. Declined further evaluation at that time.   The patient does not have symptoms concerning for COVID-19 infection (fever, chills, cough, or new shortness of breath).   Comes in today. Here alone. She notes that she had COVID about a month ago - she did not require hospitalization. She was managed conservatively but did develop pneumonia. Surprisingly, her husband did not get sick. She is feeling better. Still with some DOE. She misses her evening dose of Coreg - a fair amount - asking about stopping. BP is ok. Labs from June noted.   Past Medical History:  Diagnosis Date  . Anxiety 05/26/2013  . Arthritis   . Breast cancer, left breast ([place]) 2006   Lumpectomy, radiation and tamoxifen for 5 years.  . Colon polyp   . Cyst of pancreas   . Depression 05/26/2013  . Migraines   . Moderate tricuspid regurgitation by prior echocardiogram 05/13/2010  . Pulmonary hypertension ([place]) 05/13/2010  . Pulmonary stenosis 05/13/2010   Study Conclusions  - Left ventricle: The cavity size was normal. Systolic function was mildly reduced. The estimated ejection fraction was in the range of 45% to 50%. There is hypokinesis of the anteroseptal  myocardium. Doppler parameters are consistent with abnormal left ventricular relaxation (grade 1 diastolic dysfunction). - Right ventricle: The cavity size was mildly dilated. Wall thickness was normal. Systolic function was mildly reduced. - Right atrium: The atrium was mildly dilated. - Atrial septum: No atrial level shunt, at baseline with Doppler analysis. Prior ASD repair, CardioSeal 2004. - Pulmonic valve: Transvalvular velocity was increased, due to mild degree of stenosis. Peak velocity: 224cm/s (S). - Pulmonary arteries: Systolic pressure was mildly increased.    . 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 BREAST LUMPECTOMY     left breast cancer, status post lumpectomy, radiation and tamoxifen 5 years  . CARPAL TUNNEL RELEASE    . CESAREAN SECTION     x4  . SENTINEL LYMPH NODE BIOPSY       Medications: No outpatient medications have been marked as taking for the 12/20/18 encounter (Office Visit) with Burtis Junes, NP.     Allergies: No Known Allergies  Social History: The patient  reports that she has quit smoking. She has never used smokeless tobacco. She reports that she does not drink alcohol or use drugs.   Family History: The patient's family history includes Arthritis in her mother; Dementia in her mother; Heart attack in her father and paternal grandmother; Heart disease  in her father and paternal grandmother; Hypertension in her sister.   Review of Systems: Please see the history of present illness.   All other systems are reviewed and negative.   Physical Exam: VS:  BP 120/80   Pulse (!) 54   Ht 5\' 4"  (1.626 m)   Wt 155 lb 12.8 oz (70.7 kg)   SpO2 99%   BMI 26.74 kg/m  .  BMI Body mass index is 26.74 kg/m.  Wt Readings from Last 3 Encounters:  12/20/18 155 lb 12.8 oz (70.7 kg)  07/21/18 159 lb (72.1 kg)  06/14/18  148 lb (67.1 kg)    General: Pleasant. Well developed, well nourished and in no acute distress. She looks younger than her stated age.   HEENT: Normal.  Neck: Supple, no JVD, carotid bruits, or masses noted.  Cardiac: Regular rate and rhythm. Soft murmur noted. No edema.  Respiratory:  Lungs are clear to auscultation bilaterally with normal work of breathing.  GI: Soft and nontender.  MS: No deformity or atrophy. Gait and ROM intact.  Skin: Warm and dry. Color is normal.  Neuro:  Strength and sensation are intact and no gross focal deficits noted.  Psych: Alert, appropriate and with normal affect.   LABORATORY DATA:  EKG:  EKG is ordered today. This demonstrates sinus brady - HR is 54. Non specific ST changes.  Lab Results  Component Value Date   WBC 5.1 11/19/2017   HGB 14.9 11/19/2017   HCT 43.7 11/19/2017   PLT 190.0 11/19/2017   GLUCOSE 87 07/21/2018   CHOL 144 07/21/2018   TRIG 55.0 07/21/2018   HDL 63.60 07/21/2018   LDLCALC 70 07/21/2018   ALT 17 07/21/2018   AST 24 07/21/2018   NA 140 07/21/2018   K 4.2 07/21/2018   CL 106 07/21/2018   CREATININE 0.63 07/21/2018   BUN 10 07/21/2018   CO2 27 07/21/2018   TSH 2.92 11/19/2017   HGBA1C 5.8 11/19/2017     BNP (last 3 results) No results for input(s): BNP in the last 8760 hours.  ProBNP (last 3 results) No results for input(s): PROBNP in the last 8760 hours.   Other Studies Reviewed Today:  Echo 04/2017 Study Conclusions  - Left ventricle: The cavity size was normal. Systolic function was mildly to moderately reduced. The estimated ejection fraction was in the range of 40% to 45%. Hypokinesis of the anteroseptal myocardium. Doppler parameters are consistent with abnormal left ventricular relaxation (grade 1 diastolic dysfunction). - Aortic valve: Transvalvular velocity was within the normal range. There was no stenosis. There was no regurgitation. - Mitral valve: Transvalvular velocity was  within the normal range. There was no evidence for stenosis. There was mild regurgitation. - Left atrium: The atrium was mildly dilated. - Right ventricle: The cavity size was normal. Wall thickness was normal. Systolic function was normal. - Atrial septum: No defect or patent foramen ovale was identified by color flow Doppler. - Tricuspid valve: There was mild regurgitation. - Pulmonic valve: The findings are consistent with mild stenosis. Peak gradient (S): 18 mm Hg. - Pulmonary arteries: Systolic pressure was within the normal range. PA peak pressure: 24 mm Hg (S).    Myoview Study Highlights 2018    The left ventricular ejection fraction is normal (55-65%).  Nuclear stress EF: 57%.  There was no ST segment deviation noted during stress.  Blood pressure demonstrated a normal response to exercise.  This is a low risk study.   1. EF 57% with  normal wall motion.  2. Fixed small, mild mid inferoseptal perfusion defect. Given normal wall motion, I suspect that this may represent attenuation.  No ischemia.  3. Low risk study.       ASSESSMENT & PLAN:    1. Chronic systolic HF/NICM - would encourage her to stay on her current regimen - especially if EF remains reduced. She had improvement on prior echo.   2. Resting bradycardia - HR is 54.   3. Valvular heart disease - pulmonic stenosis/Prior closure of ASD  4. Chronic DOE - updating her echo  5. Recent COVID sickness - seems recovered. She will continue with the 3W's.   Current medicines are reviewed with the patient today.  The patient does not have concerns regarding medicines other than what has been noted above.  The following changes have been made:  See above.  Labs/ tests ordered today include:    Orders Placed This Encounter  Procedures  . EKG 12-Lead  . ECHOCARDIOGRAM COMPLETE     Disposition:   FU with Dr. Tamala Julian in 6 months.  I am happy to see back as needed.   Patient is agreeable  to this plan and will call if any problems develop in the interim.   SignedTruitt Merle, NP  12/20/2018 2:03 PM  [place] [address] [place], [place]  [address] Phone: [phone] Fax: [phone]

## 2018-12-20 ENCOUNTER — Encounter: Payer: Self-pay | Admitting: *Deleted

## 2018-12-20 ENCOUNTER — Encounter: Payer: Self-pay | Admitting: Nurse Practitioner

## 2018-12-20 ENCOUNTER — Ambulatory Visit (INDEPENDENT_AMBULATORY_CARE_PROVIDER_SITE_OTHER): Payer: Medicare Other | Admitting: Nurse Practitioner

## 2018-12-20 ENCOUNTER — Other Ambulatory Visit: Payer: Self-pay

## 2018-12-20 VITALS — BP 120/80 | HR 54 | Ht 64.0 in | Wt 155.8 lb

## 2018-12-20 DIAGNOSIS — R0600 Dyspnea, unspecified: Secondary | ICD-10-CM

## 2018-12-20 DIAGNOSIS — I5022 Chronic systolic (congestive) heart failure: Secondary | ICD-10-CM | POA: Diagnosis not present

## 2018-12-20 DIAGNOSIS — Z7189 Other specified counseling: Secondary | ICD-10-CM

## 2018-12-20 DIAGNOSIS — Z8774 Personal history of (corrected) congenital malformations of heart and circulatory system: Secondary | ICD-10-CM | POA: Diagnosis not present

## 2018-12-20 DIAGNOSIS — R0609 Other forms of dyspnea: Secondary | ICD-10-CM

## 2018-12-20 NOTE — Patient Instructions (Addendum)
After Visit Summary:  We will be checking the following labs today - NONE   Medication Instructions:    Continue with your current medicines.   For now, stay on the twice a day Coreg - try setting a reminder for the evening dose.    If you need a refill on your cardiac medications before your next appointment, please call your pharmacy.     Testing/Procedures To Be Arranged:  Echocardiogram  Follow-Up:   See [place] back in 6 months - You will receive a reminder letter in the mail two months in advance. If you don't receive a letter, please call our office to schedule the follow-up appointment.    At [facility], you and your health needs are our priority.  As part of our continuing mission to provide you with exceptional heart care, we have created designated Provider Care Teams.  These Care Teams include your primary Cardiologist (physician) and Advanced Practice Providers (APPs -  Physician Assistants and Nurse Practitioners) who all work together to provide you with the care you need, when you need it.  Special Instructions:  . Stay safe, stay home, wash your hands for at least 20 seconds and wear a mask when out in public.  . It was good to talk with you today.    Call the [place] office at [phone] if you have any questions, problems or concerns.

## 2019-01-05 ENCOUNTER — Other Ambulatory Visit: Payer: Self-pay

## 2019-01-05 ENCOUNTER — Ambulatory Visit (HOSPITAL_COMMUNITY): Payer: Medicare Other | Attending: Internal Medicine

## 2019-01-05 DIAGNOSIS — I5022 Chronic systolic (congestive) heart failure: Secondary | ICD-10-CM | POA: Insufficient documentation

## 2019-01-12 LAB — HEMOGLOBIN A1C: Hemoglobin A1C: 6

## 2019-01-14 ENCOUNTER — Telehealth: Payer: Self-pay | Admitting: Nurse Practitioner

## 2019-01-14 NOTE — Telephone Encounter (Signed)
Patient return call for her Echo results.

## 2019-01-14 NOTE — Telephone Encounter (Signed)
Returned call to Pt.  ECHO results given.  Encouraged Pt to take her carvedilol as prescribed.  Pt indicates understanding.

## 2019-02-01 ENCOUNTER — Other Ambulatory Visit: Payer: Self-pay

## 2019-02-01 MED ORDER — ATORVASTATIN CALCIUM 40 MG PO TABS: 40.0000 mg | ORAL_TABLET | Freq: Every day | ORAL | 0 refills | Status: DC

## 2019-02-07 ENCOUNTER — Ambulatory Visit (INDEPENDENT_AMBULATORY_CARE_PROVIDER_SITE_OTHER): Payer: Medicare Other | Admitting: Family Medicine

## 2019-02-07 ENCOUNTER — Other Ambulatory Visit: Payer: Self-pay

## 2019-02-07 ENCOUNTER — Encounter: Payer: Self-pay | Admitting: Family Medicine

## 2019-02-07 VITALS — BP 121/73 | HR 65 | Temp 98.1°F | Resp 18 | Ht 64.0 in | Wt 155.0 lb

## 2019-02-07 DIAGNOSIS — Z23 Encounter for immunization: Secondary | ICD-10-CM

## 2019-02-07 DIAGNOSIS — F329 Major depressive disorder, single episode, unspecified: Secondary | ICD-10-CM

## 2019-02-07 DIAGNOSIS — Z79899 Other long term (current) drug therapy: Secondary | ICD-10-CM

## 2019-02-07 DIAGNOSIS — Z131 Encounter for screening for diabetes mellitus: Secondary | ICD-10-CM

## 2019-02-07 DIAGNOSIS — M8580 Other specified disorders of bone density and structure, unspecified site: Secondary | ICD-10-CM

## 2019-02-07 DIAGNOSIS — E663 Overweight: Secondary | ICD-10-CM

## 2019-02-07 DIAGNOSIS — I5022 Chronic systolic (congestive) heart failure: Secondary | ICD-10-CM | POA: Diagnosis not present

## 2019-02-07 DIAGNOSIS — E559 Vitamin D deficiency, unspecified: Secondary | ICD-10-CM | POA: Diagnosis not present

## 2019-02-07 DIAGNOSIS — F419 Anxiety disorder, unspecified: Secondary | ICD-10-CM

## 2019-02-07 DIAGNOSIS — F32A Depression, unspecified: Secondary | ICD-10-CM

## 2019-02-07 DIAGNOSIS — Z0001 Encounter for general adult medical examination with abnormal findings: Secondary | ICD-10-CM | POA: Diagnosis not present

## 2019-02-07 DIAGNOSIS — I272 Pulmonary hypertension, unspecified: Secondary | ICD-10-CM

## 2019-02-07 DIAGNOSIS — E785 Hyperlipidemia, unspecified: Secondary | ICD-10-CM | POA: Diagnosis not present

## 2019-02-07 MED ORDER — CITALOPRAM HYDROBROMIDE 40 MG PO TABS: ORAL_TABLET | ORAL | 1 refills | Status: DC

## 2019-02-07 MED ORDER — ZOSTER VAC RECOMB ADJUVANTED 50 MCG/0.5ML IM SUSR: 0.5000 mL | Freq: Once | INTRAMUSCULAR | 1 refills | Status: AC

## 2019-02-07 MED ORDER — ATORVASTATIN CALCIUM 40 MG PO TABS: 40.0000 mg | ORAL_TABLET | Freq: Every day | ORAL | 3 refills | Status: DC

## 2019-02-07 NOTE — Progress Notes (Signed)
This visit occurred during the SARS-CoV-2 public health emergency.  Safety protocols were in place, including screening questions prior to the visit, additional usage of staff PPE, and extensive cleaning of exam room while observing appropriate contact time as indicated for disinfecting solutions.    [name], female  DOB: 23-Apr-1940, 79 y.o.   MRN: [id number] Patient Care Team    Relationship Specialty Notifications Start End  Ma Hillock, DO PCP - General Family Medicine  03/03/17   Belva Crome, MD PCP - Cardiology Cardiology Admissions 05/07/17   Magrinat, Virgie Dad, MD Consulting Physician Oncology  03/03/17   Renelda Loma, OD  Optometry  03/03/17   Dian Queen, MD Consulting Physician Obstetrics and Gynecology  03/03/17     Chief Complaint  Patient presents with  . Annual Exam    Subjective:  Grace Fletcher is a 79 y.o.  Female  present for CPE. All past medical history, surgical history, allergies, family history, immunizations, medications and social history were updated in the electronic medical record today. All recent labs, ED visits and hospitalizations within the last year were reviewed.  Health maintenance:  Colonoscopy: completed 01/28/2015 Mammogram: completed:11/2018, prior h/o left breast cancer (stage IIa) lumpectomy, radiation and tamoxifen for 5 years. [facility].  Cervical cancer screening: > 65 Immunizations: tdap 03/28/2015, Influenza UTD 2020 (encouraged yearly), PNA series PNA 23 2019> prevnar completed today, shingrix script provided again today.  She did not have completed last year.  DEXA:completed 2019 osteopenia -2.4 Assistive device: none Oxygen [id number] Patient has a Dental home. 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  She reports compliance w/  losartan 25 mg daily and coreg 3.125 BID. She is established with cardiology. Blood pressures ranges at home are normal. Patient denies chest pain, shortness of breath, dizziness or lower extremity edema.   Pt takes daily baby ASA. Pt is  prescribed statin.Her last echo was 4/19. She had a recent appt with her cardiologist and they had told her to follow up if shortness of breath worsened. They offered her stress test and echo and she wanted to wait.  BMP: 07/21/2018 within normal limits CBC: 11/19/2017 within normal limits Lipids: 07/21/2018 within normal limits TSH 11/19/2017 within normal limits Diet: Low-sodium Exercise: Routine exercise RF: Pulmonary hypertension, family history heart disease, hyperlipidemia  Depression: Patient reports she was started on Celexa many years ago when she was diagnosed with breast cancer. She had discontinued until her son had become incarcerated. Celexa 40 mg dose still is working well  for her/ She would like refills today.   Depression screen [facility] 2/9 02/07/2019 07/21/2018 11/19/2017 03/03/2017  Decreased Interest 0 0 0 0  Down, Depressed, Hopeless 0 0 0 1  PHQ - 2 Score 0 0 0 1  Altered sleeping - 0 0 0  Tired, decreased energy - 1 1 1   Change in appetite - 0 0 1  Feeling bad or failure about yourself  - 0 0 0  Trouble concentrating - 0 0 0  Moving slowly or fidgety/restless - 0 0 0  Suicidal thoughts - 0 0 0  PHQ-9 Score - 1 1 3   Difficult doing work/chores - Not difficult at all Not difficult at all -   GAD 7 : Generalized Anxiety Score 07/21/2018 03/03/2017  Nervous, Anxious, on Edge 0 0  Control/stop  worrying 0 0  Worry too much - different things 1 0  Trouble relaxing 0 0  Restless 0 0  Easily annoyed or irritable 0 0  Afraid - awful might happen 0 0  Total GAD 7 Score 1 0  Anxiety Difficulty Not difficult at all -   Immunization History  Administered Date(s) Administered  . DTaP 04/13/2015    . Influenza, High Dose Seasonal PF 11/27/2016, 11/19/2017, 12/26/2018, 12/26/2018  . Influenza,inj,Quad PF,6+ Mos 11/28/2018  . Pneumococcal Conjugate-13 02/07/2019  . Pneumococcal Polysaccharide-23 11/19/2017   Past Medical History:  Diagnosis Date  . Anxiety 05/26/2013  . Arthritis   . Breast cancer, left breast ([place]) 2006   Lumpectomy, radiation and tamoxifen for 5 years.  . Colon polyp   . Cyst of pancreas   . Depression 05/26/2013  . Migraines   . Moderate tricuspid regurgitation by prior echocardiogram 05/13/2010  . Pneumonia due to COVID-19 virus   . Pulmonary hypertension ([place]) 05/13/2010  . Pulmonary stenosis 05/13/2010   Study Conclusions  - Left ventricle: The cavity size was normal. Systolic function was mildly reduced. The estimated ejection fraction was in the range of 45% to 50%. There is hypokinesis of the anteroseptal myocardium. Doppler parameters are consistent with abnormal left ventricular relaxation (grade 1 diastolic dysfunction). - Right ventricle: The cavity size was mildly dilated. Wall thickness was normal. Systolic function was mildly reduced. - Right atrium: The atrium was mildly dilated. - Atrial septum: No atrial level shunt, at baseline with Doppler analysis. Prior ASD repair, CardioSeal 2004. - Pulmonic valve: Transvalvular velocity was increased, due to mild degree of stenosis. Peak velocity: 224cm/s (S). - Pulmonary arteries: Systolic pressure was mildly increased.    . 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 BREAST LUMPECTOMY     left breast cancer, status post lumpectomy, radiation and tamoxifen 5 years  . CARPAL TUNNEL RELEASE    . CESAREAN SECTION     x4  . SENTINEL LYMPH NODE BIOPSY     Family History  Problem Relation Age of Onset  . Dementia Mother   . Arthritis Mother   . Heart  attack Father   . Heart disease Father   . Hypertension Sister   . Heart attack Paternal Grandmother   . Heart disease Paternal Grandmother    Social History   Social History Narrative   Married. 4 children.    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   Takes a daily vitamin.   Smoke alarm in the home.   Wears her seatbelt.   Feels safe in her relationships.    Allergies as of 02/07/2019   No Known Allergies     Medication List       Accurate as of February 07, 2019 11:59 PM. If you have any questions, ask your nurse or doctor.        aspirin 81 MG tablet Take 81 mg by mouth daily.   atorvastatin 40 MG tablet Commonly known as: LIPITOR Take 1 tablet (40 mg total) by mouth daily.   carvedilol 3.125 MG tablet Commonly known as: COREG TAKE 1 TABLET (3.125 MG TOTAL) BY MOUTH 2 (TWO) TIMES DAILY.   citalopram 40 MG tablet Commonly known as: CELEXA TAKE 1 TABLET (40 MG TOTAL) BY MOUTH DAILY. Follow up appointment needed for further refills.   losartan 25 MG tablet Commonly known as: COZAAR TAKE  1 TABLET BY MOUTH EVERY DAY   multivitamin capsule Take 1 capsule by mouth daily.   Restasis 0.05 % ophthalmic emulsion Generic drug: cycloSPORINE INSTILL 1 DROP INTO BOTH EYES TWICE A DAY   STOOL SOFTENER PO Take 1 capsule by mouth as needed.   Vitamin D 1000 units capsule Take 1,000 Units by mouth daily.   Zoster Vaccine Adjuvanted injection Commonly known as: SHINGRIX Inject 0.5 mLs into the muscle once for 1 dose. Rpt dose in 2-6 months. Started by: Howard Pouch, DO       All past medical history, surgical history, allergies, family history, immunizations andmedications were updated in the EMR today and reviewed under the history and medication portions of their EMR.     Recent Results (from the past 2160 hour(s))  HM MAMMOGRAPHY     Status: None   Collection Time: 12/01/18 12:00 AM  Result Value Ref Range   HM Mammogram 0-4 Bi-Rad 0-4 Bi-Rad, Self Reported Normal    ROS: 14 pt review of systems performed and negative (unless mentioned in an HPI)  Objective: BP 121/73 (BP Location: Left Arm, Patient Position: Sitting, Cuff Size: Normal)   Pulse 65   Temp 98.1 F (36.7 C) (Temporal)   Resp 18   Ht 5\' 4"  (1.626 m)   Wt 155 lb (70.3 kg)   SpO2 100%   BMI 26.61 kg/m  Gen: Afebrile. No acute distress. Nontoxic in appearance, well-developed, well-nourished, pleasant, Caucasian female female. HENT: AT. [place]. Bilateral TM visualized and normal in appearance, normal external auditory canal. MMM, no oral lesions, adequate dentition. Bilateral nares within normal limits. Throat without erythema, ulcerations or exudates.  No cough on exam, no hoarseness on exam. Eyes:Pupils Equal Round Reactive to light, Extraocular movements intact,  Conjunctiva without redness, discharge or icterus. Neck/lymp/endocrine: Supple, no lymphadenopathy, no thyromegaly CV: RRR 1/6 systolic murmur, no edema, +2/4 P posterior tibialis pulses. Chest: CTAB, no wheeze, rhonchi or crackles.  Normal respiratory effort.  Good air movement. Abd: Soft.  Flat. NTND. BS present.  No masses palpated. No hepatosplenomegaly. No rebound tenderness or guarding. Skin: No rashes, purpura or petechiae. Warm and well-perfused. Skin intact. Neuro/Msk:  Normal gait. PERLA. EOMi. Alert. Oriented x3.  Cranial nerves II through XII intact. Muscle strength 5/5 upper/lower extremity. DTRs equal bilaterally. Psych: Normal affect, dress and demeanor. Normal speech. Normal thought content and judgment.   No exam data present  Assessment/plan: Grace Fletcher is a 79 y.o. female present for CPE Depression, unspecified depression type/Anxiety Stable. Continue Celexa 40 mg daily. - TSH Follow-up every 6 months.  Chronic systolic heart failure (HCC)/hyperlipidemia/statin therapy/overweight/pulmonary hypertension Stable. CBC -Continue Cozaar, Coreg>> meds managed by cardiology. -Continue baby  aspirin. Continue Lipitor, refills provided today. Osteopenia, unspecified location/edema D deficiency Continue vitamin D 1000 units daily.  Vitamin D levels checked today. Repeat DEXA 2021-2022 Diabetes mellitus screening - Hemoglobin A1c Immunization due - Prevnar 13 Encounter for preventative health exam: Patient was encouraged to exercise greater than 150 minutes a week. Patient was encouraged to choose a diet filled with fresh fruits and vegetables, and lean meats. AVS provided to patient today for education/recommendation on gender specific health and safety maintenance. Colonoscopy: completed 01/28/2015 Mammogram: completed:11/2018, prior h/o left breast cancer (stage IIa) lumpectomy, radiation and tamoxifen for 5 years. [facility].  Cervical cancer screening: > 65 Immunizations: tdap 03/28/2015, Influenza UTD 2020 (encouraged yearly), PNA series PNA 23 2019> prevnar completed today, shingrix script provided again today.  She did not have completed  last year.  DEXA:completed 2019 osteopenia -2.4, rpt 2-3 years  Return in about 6 months (around 08/07/2019) for [facility] (30 min).  Orders Placed This Encounter  Procedures  . Prevnar 13  . TSH  . CBC  . Hemoglobin A1c  . Vitamin D (25 hydroxy)   Meds ordered this encounter  Medications  . atorvastatin (LIPITOR) 40 MG tablet    Sig: Take 1 tablet (40 mg total) by mouth daily.    Dispense:  90 tablet    Refill:  3  . citalopram (CELEXA) 40 MG tablet    Sig: TAKE 1 TABLET (40 MG TOTAL) BY MOUTH DAILY. Follow up appointment needed for further refills.    Dispense:  90 tablet    Refill:  1  . Zoster Vaccine Adjuvanted [facility]) injection    Sig: Inject 0.5 mLs into the muscle once for 1 dose. Rpt dose in 2-6 months.    Dispense:  0.5 mL    Refill:  1   Referral Orders  No referral(s) requested today     Electronically signed by: Howard Pouch, [place]

## 2019-02-07 NOTE — Patient Instructions (Addendum)
Health Maintenance After Age 77 After age 77, you are at a higher risk for certain long-term diseases and infections as well as injuries from falls. Falls are a major cause of broken bones and head injuries in people who are older than age 77. Getting regular preventive care can help to keep you healthy and well. Preventive care includes getting regular testing and making lifestyle changes as recommended by your health care provider. Talk with your health care provider about:  Which screenings and tests you should have. A screening is a test that checks for a disease when you have no symptoms.  A diet and exercise plan that is right for you. What should I know about screenings and tests to prevent falls? Screening and testing are the best ways to find a health problem early. Early diagnosis and treatment give you the best chance of managing medical conditions that are common after age 77. Certain conditions and lifestyle choices may make you more likely to have a fall. Your health care provider may recommend:  Regular vision checks. Poor vision and conditions such as cataracts can make you more likely to have a fall. If you wear glasses, make sure to get your prescription updated if your vision changes.  Medicine review. Work with your health care provider to regularly review all of the medicines you are taking, including over-the-counter medicines. Ask your health care provider about any side effects that may make you more likely to have a fall. Tell your health care provider if any medicines that you take make you feel dizzy or sleepy.  Osteoporosis screening. Osteoporosis is a condition that causes the bones to get weaker. This can make the bones weak and cause them to break more easily.  Blood pressure screening. Blood pressure changes and medicines to control blood pressure can make you feel dizzy.  Strength and balance checks. Your health care provider may recommend certain tests to check your  strength and balance while standing, walking, or changing positions.  Foot health exam. Foot pain and numbness, as well as not wearing proper footwear, can make you more likely to have a fall.  Depression screening. You may be more likely to have a fall if you have a fear of falling, feel emotionally low, or feel unable to do activities that you used to do.  Alcohol use screening. Using too much alcohol can affect your balance and may make you more likely to have a fall. What actions can I take to lower my risk of falls? General instructions  Talk with your health care provider about your risks for falling. Tell your health care provider if: ? You fall. Be sure to tell your health care provider about all falls, even ones that seem minor. ? You feel dizzy, sleepy, or off-balance.  Take over-the-counter and prescription medicines only as told by your health care provider. These include any supplements.  Eat a healthy diet and maintain a healthy weight. A healthy diet includes low-fat dairy products, low-fat (lean) meats, and fiber from whole grains, beans, and lots of fruits and vegetables. Home safety  Remove any tripping hazards, such as rugs, cords, and clutter.  Install safety equipment such as grab bars in bathrooms and safety rails on stairs.  Keep rooms and walkways well-lit. Activity   Follow a regular exercise program to stay fit. This will help you maintain your balance. Ask your health care provider what types of exercise are appropriate for you.  If you need a cane or   walker, use it as recommended by your health care provider.  Wear supportive shoes that have nonskid soles. Lifestyle  Do not drink alcohol if your health care provider tells you not to drink.  If you drink alcohol, limit how much you have: ? 0-1 drink a day for women. ? 0-2 drinks a day for men.  Be aware of how much alcohol is in your drink. In the [place] one drink equals one typical bottle of beer (12  oz), one-half glass of wine (5 oz), or one shot of hard liquor (1 oz).  Do not use any products that contain nicotine or tobacco, such as cigarettes and e-cigarettes. If you need help quitting, ask your health care provider. Summary  Having a healthy lifestyle and getting preventive care can help to protect your health and wellness after age 77.  Screening and testing are the best way to find a health problem early and help you avoid having a fall. Early diagnosis and treatment give you the best chance for managing medical conditions that are more common for people who are older than age 77.  Falls are a major cause of broken bones and head injuries in people who are older than age 77. Take precautions to prevent a fall at home.  Work with your health care provider to learn what changes you can make to improve your health and wellness and to prevent falls. This information is not intended to replace advice given to you by your health care provider. Make sure you discuss any questions you have with your health care provider. Document Revised: 05/06/2018 Document Reviewed: 11/26/2016 Elsevier Patient Education  2020 Elsevier Inc.  

## 2019-02-08 ENCOUNTER — Ambulatory Visit: Payer: Medicare Other | Attending: Internal Medicine

## 2019-02-08 ENCOUNTER — Encounter: Payer: Self-pay | Admitting: Family Medicine

## 2019-02-08 DIAGNOSIS — Z23 Encounter for immunization: Secondary | ICD-10-CM | POA: Insufficient documentation

## 2019-02-08 LAB — CBC
HCT: 44.8 % (ref 36.0–46.0)
Hemoglobin: 14.8 g/dL (ref 12.0–15.0)
MCHC: 33 g/dL (ref 30.0–36.0)
MCV: 96.9 fl (ref 78.0–100.0)
Platelets: 198 10*3/uL (ref 150.0–400.0)
RBC: 4.63 Mil/uL (ref 3.87–5.11)
RDW: 13.6 % (ref 11.5–15.5)
WBC: 5.6 10*3/uL (ref 4.0–10.5)

## 2019-02-08 LAB — HEMOGLOBIN A1C: Hgb A1c MFr Bld: 5.8 % (ref 4.6–6.5)

## 2019-02-08 LAB — VITAMIN D 25 HYDROXY (VIT D DEFICIENCY, FRACTURES): VITD: 58.09 ng/mL (ref 30.00–100.00)

## 2019-02-08 LAB — TSH: TSH: 2.14 u[IU]/mL (ref 0.35–4.50)

## 2019-02-09 ENCOUNTER — Encounter: Payer: Self-pay | Admitting: Family Medicine

## 2019-02-10 ENCOUNTER — Ambulatory Visit: Payer: Medicare Other | Attending: Internal Medicine

## 2019-02-28 ENCOUNTER — Ambulatory Visit: Payer: Medicare Other

## 2019-03-04 ENCOUNTER — Ambulatory Visit: Payer: Medicare Other

## 2019-03-07 ENCOUNTER — Ambulatory Visit: Payer: Medicare Other | Attending: Internal Medicine

## 2019-03-07 DIAGNOSIS — Z23 Encounter for immunization: Secondary | ICD-10-CM

## 2019-03-07 NOTE — Progress Notes (Signed)
   Covid-19 Vaccination Clinic  Name:  Grace Fletcher    MRN: [id number] DOB: [birth year]  03/07/2019  [name] was observed post Covid-19 immunization for 15 minutes without incidence. She was provided with Vaccine Information Sheet and instruction to access the V-Safe system.   [name] was instructed to call 911 with any severe reactions post vaccine: Marland Kitchen Difficulty breathing  . Swelling of your face and throat  . A fast heartbeat  . A bad rash all over your body  . Dizziness and weakness    Immunizations Administered    Name Date Dose VIS Date Route   Pfizer COVID-19 Vaccine 03/07/2019  3:18 PM 0.3 mL 01/07/2019 Intramuscular   Manufacturer: [place]   Lot: [id number]   [place]: [id number]

## 2019-04-01 ENCOUNTER — Ambulatory Visit: Payer: Medicare Other | Attending: Internal Medicine

## 2019-04-01 DIAGNOSIS — Z23 Encounter for immunization: Secondary | ICD-10-CM | POA: Insufficient documentation

## 2019-04-01 NOTE — Progress Notes (Signed)
   Covid-19 Vaccination Clinic  Name:  Grace Fletcher    MRN: [id number] DOB: [birth year]  04/01/2019  Grace Fletcher was observed post Covid-19 immunization for 15 minutes without incident. She was provided with Vaccine Information Sheet and instruction to access the V-Safe system.   Grace Fletcher was instructed to call 911 with any severe reactions post vaccine: Marland Kitchen Difficulty breathing  . Swelling of face and throat  . A fast heartbeat  . A bad rash all over body  . Dizziness and weakness   Immunizations Administered    Name Date Dose VIS Date Route   Pfizer COVID-19 Vaccine 04/01/2019 11:13 AM 0.3 mL 01/07/2019 Intramuscular   Manufacturer: [place]   Lot: [id number]   [place]: [id number]

## 2019-07-11 ENCOUNTER — Encounter: Payer: Self-pay | Admitting: Family Medicine

## 2019-07-11 ENCOUNTER — Other Ambulatory Visit: Payer: Self-pay

## 2019-07-11 ENCOUNTER — Telehealth (INDEPENDENT_AMBULATORY_CARE_PROVIDER_SITE_OTHER): Payer: Medicare Other | Admitting: Family Medicine

## 2019-07-11 VITALS — BP 114/72 | HR 57 | Wt 151.6 lb

## 2019-07-11 DIAGNOSIS — I5022 Chronic systolic (congestive) heart failure: Secondary | ICD-10-CM

## 2019-07-11 DIAGNOSIS — F329 Major depressive disorder, single episode, unspecified: Secondary | ICD-10-CM

## 2019-07-11 DIAGNOSIS — F419 Anxiety disorder, unspecified: Secondary | ICD-10-CM

## 2019-07-11 DIAGNOSIS — E785 Hyperlipidemia, unspecified: Secondary | ICD-10-CM | POA: Diagnosis not present

## 2019-07-11 DIAGNOSIS — F32A Depression, unspecified: Secondary | ICD-10-CM

## 2019-07-11 DIAGNOSIS — Z79899 Other long term (current) drug therapy: Secondary | ICD-10-CM

## 2019-07-11 MED ORDER — CITALOPRAM HYDROBROMIDE 40 MG PO TABS: ORAL_TABLET | ORAL | 1 refills | Status: DC

## 2019-07-11 NOTE — Progress Notes (Signed)
VIRTUAL VISIT VIA VIDEO  I connected with Grace Fletcher on 07/11/19 at 10:00 AM EDT by a video enabled telemedicine application and verified that I am speaking with the correct person using two identifiers. Location patient: Home Location provider: [facility], Office Persons participating in the virtual visit: Patient, Dr. Raoul Pitch and R.Baker, LPN  I discussed the limitations of evaluation and management by telemedicine and the availability of in person appointments. The patient expressed understanding and agreed to proceed.    [name], female  DOB: 11/26/1940, 79 y.o.   MRN: [id number] Patient Care Team    Relationship Specialty Notifications Start End  Ma Hillock, DO PCP - General Family Medicine  03/03/17   Belva Crome, MD PCP - Cardiology Cardiology Admissions 05/07/17   Magrinat, Virgie Dad, MD Consulting Physician Oncology  03/03/17   Renelda Loma, OD  Optometry  03/03/17   Dian Queen, MD Consulting Physician Obstetrics and Gynecology  03/03/17     Chief Complaint  Patient presents with  . Follow-up    Subjective: Grace Fletcher is a 79 y.o.  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  She reports compliance w/  losartan 25 mg daily and coreg 3.125 BID. She is established with cardiology. Blood pressures ranges at home are normal. Patient denies chest pain, shortness of breath, dizziness or lower extremity edema. She does have lower end BP and HR.   Pt takes daily baby ASA. Pt is  prescribed statin.Her last echo was 4/19. She had a recent appt with her cardiologist and they had told her to follow up if shortness of breath worsened. They offered her stress test and echo and she wanted to wait.  BMP: 07/21/2018 within normal limits CBC:  11/19/2017 within normal limits Lipids: 07/21/2018 within normal limits TSH 11/19/2017 within normal limits Diet: Low-sodium Exercise: Routine exercise RF: Pulmonary hypertension, family history heart disease, hyperlipidemia  Depression: Patient reports she was started on Celexa many years ago when she was diagnosed with breast cancer. She had discontinued until her son had become incarcerated. She reports he is hopefully being released within the next year and likely will be living with them. She would like to continue  Celexa 40 mg dose.  Depression screen [facility] 2/9 02/07/2019 07/21/2018 11/19/2017 03/03/2017  Decreased Interest 0 0 0 0  Down, Depressed, Hopeless 0 0 0 1  PHQ - 2 Score 0 0 0 1  Altered sleeping - 0 0 0  Tired, decreased energy - 1 1 1   Change in appetite - 0 0 1  Feeling bad or failure about yourself  - 0 0 0  Trouble concentrating - 0 0 0  Moving slowly or fidgety/restless - 0 0 0  Suicidal thoughts - 0 0 0  PHQ-9 Score - 1 1 3   Difficult doing work/chores - Not difficult at all Not difficult at all -   GAD 7 : Generalized Anxiety Score 07/21/2018 03/03/2017  Nervous, Anxious, on Edge 0 0  Control/stop worrying 0 0  Worry too much - different things 1 0  Trouble relaxing 0 0  Restless 0 0  Easily annoyed or irritable 0 0  Afraid - awful might happen 0 0  Total GAD 7 Score 1 0  Anxiety Difficulty Not difficult at all -   Immunization History  Administered Date(s)  Administered  . DTaP 04/13/2015  . Influenza, High Dose Seasonal PF 11/27/2016, 11/19/2017, 12/26/2018, 12/26/2018  . Influenza,inj,Quad PF,6+ Mos 11/28/2018  . PFIZER SARS-COV-2 Vaccination 02/08/2019, 03/07/2019, 04/01/2019  . Pneumococcal Conjugate-13 02/07/2019  . Pneumococcal Polysaccharide-23 11/19/2017   Past Medical History:  Diagnosis Date  . Anxiety 05/26/2013  . Arthritis   . Breast cancer, left breast ([place]) 2006   Lumpectomy, radiation and tamoxifen for 5 years.  . Colon polyp   . Cyst  of pancreas   . Depression 05/26/2013  . Migraines   . Moderate tricuspid regurgitation by prior echocardiogram 05/13/2010  . Pneumonia due to COVID-19 virus   . Pulmonary hypertension ([place]) 05/13/2010  . Pulmonary stenosis 05/13/2010   Study Conclusions  - Left ventricle: The cavity size was normal. Systolic function was mildly reduced. The estimated ejection fraction was in the range of 45% to 50%. There is hypokinesis of the anteroseptal myocardium. Doppler parameters are consistent with abnormal left ventricular relaxation (grade 1 diastolic dysfunction). - Right ventricle: The cavity size was mildly dilated. Wall thickness was normal. Systolic function was mildly reduced. - Right atrium: The atrium was mildly dilated. - Atrial septum: No atrial level shunt, at baseline with Doppler analysis. Prior ASD repair, CardioSeal 2004. - Pulmonic valve: Transvalvular velocity was increased, due to mild degree of stenosis. Peak velocity: 224cm/s (S). - Pulmonary arteries: Systolic pressure was mildly increased.    . 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 BREAST LUMPECTOMY     left breast cancer, status post lumpectomy, radiation and tamoxifen 5 years  . CARPAL TUNNEL RELEASE    . CESAREAN SECTION     x4  . SENTINEL LYMPH NODE BIOPSY     Family History  Problem Relation Age of Onset  . Dementia Mother   . Arthritis Mother   . Heart attack Father   . Heart disease Father   . Hypertension Sister   . Heart attack Paternal Grandmother   . Heart disease Paternal Grandmother    Social History   Social History Narrative   Married. 4 children.    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   Takes a daily vitamin.   Smoke alarm in the home.   Wears her seatbelt.   Feels safe in her relationships.    Allergies as of 07/11/2019   No Known  Allergies     Medication List       Accurate as of July 11, 2019 10:32 AM. If you have any questions, ask your nurse or doctor.        aspirin 81 MG tablet Take 81 mg by mouth daily.   atorvastatin 40 MG tablet Commonly known as: LIPITOR Take 1 tablet (40 mg total) by mouth daily.   carvedilol 3.125 MG tablet Commonly known as: COREG TAKE 1 TABLET (3.125 MG TOTAL) BY MOUTH 2 (TWO) TIMES DAILY.   citalopram 40 MG tablet Commonly known as: CELEXA TAKE 1 TABLET (40 MG TOTAL) BY MOUTH DAILY. Follow up appointment needed for further refills.   losartan 25 MG tablet Commonly known as: COZAAR TAKE 1 TABLET BY MOUTH EVERY DAY   multivitamin capsule Take 1 capsule by mouth daily.   Restasis 0.05 % ophthalmic emulsion Generic drug: cycloSPORINE INSTILL 1 DROP INTO BOTH EYES TWICE A DAY   STOOL SOFTENER PO Take 1 capsule by mouth as needed.   Vitamin D  1000 units capsule Take 1,000 Units by mouth daily.       All past medical history, surgical history, allergies, family history, immunizations andmedications were updated in the EMR today and reviewed under the history and medication portions of their EMR.     No results found for this or any previous visit (from the past 2160 hour(s)). ROS: 14 pt review of systems performed and negative (unless mentioned in an HPI)  Objective: BP 114/72   Pulse (!) 57   Wt 151 lb 9.6 oz (68.8 kg)   BMI 26.02 kg/m  Gen: Afebrile. No acute distress.  HENT: AT. [place]. Neuro: . Alert. Oriented x3  Psych: Normal affect, dress and demeanor. Normal speech. Normal thought content and judgment.   No exam data present  Assessment/plan: Grace Fletcher is a 79 y.o. female present for CPE Depression, unspecified depression type/Anxiety Stable.  Continue Celexa 40 mg daily. Follow-up every 6 months (jan 2022 due for CMC/CPE- will extend med if needed to last to her CPE)  Chronic systolic heart failure (HCC)/hyperlipidemia/statin  therapy/overweight/pulmonary hypertension Stable. Managed by cardiology.  -Continue Cozaar, Coreg>> meds managed by cardiology. -Continue baby aspirin. Continue Lipitor, refills provided today.   Return in about 7 months (around 02/08/2020) for CPE (30 min).  No orders of the defined types were placed in this encounter.  Meds ordered this encounter  Medications  . citalopram (CELEXA) 40 MG tablet    Sig: TAKE 1 TABLET (40 MG TOTAL) BY MOUTH DAILY. Follow up appointment needed for further refills.    Dispense:  90 tablet    Refill:  1   Referral Orders  No referral(s) requested today     Electronically signed by: Howard Pouch, [place]

## 2019-08-22 NOTE — Progress Notes (Signed)
Cardiology Office Note    Date:  08/23/2019   ID:  Grace Fletcher, DOB [birth year], MRN [id number]  PCP:  Ma Hillock, DO  Cardiologist: Sinclair Grooms, MD EPS: None  Chief Complaint  Patient presents with  . Follow-up    History of Present Illness:  Grace Fletcher is a 79 y.o. female with history of pulmonic stenosis, prior percutaneous ASD repair, mild dilatation of the RV, NICM  With chronic systolic CHF.  Saw Truitt Merle, NP and was getting over Covid. Was missing her evening dose of coreg and encourage to take regularly.Echo 12/2018 LVEF 45% global HK, grade 1 DD.  Patient complains of fatigue, dizzy when she stands or turns quickly. Ran out of the carvedilol a couple of weeks ago. She feels like she has a little more energy and dizziness has resolved. Does a lot of yard work and feels her heart beating hard-not fast and gets short of breath and has to rest.   Past Medical History:  Diagnosis Date  . Anxiety 05/26/2013  . Arthritis   . Breast cancer, left breast ([place]) 2006   Lumpectomy, radiation and tamoxifen for 5 years.  . Chronic systolic heart failure ([place]) 05/14/2016  . Colon polyp   . Cyst of pancreas   . Depression 05/26/2013  . Migraines   . Moderate tricuspid regurgitation by prior echocardiogram 05/13/2010  . Osteopenia 12/04/2017  . Pneumonia due to COVID-19 virus   . Pulmonary hypertension ([place]) 05/13/2010  . Pulmonary stenosis 05/13/2010   Study Conclusions  - Left ventricle: The cavity size was normal. Systolic function was mildly reduced. The estimated ejection fraction was in the range of 45% to 50%. There is hypokinesis of the anteroseptal myocardium. Doppler parameters are consistent with abnormal left ventricular relaxation (grade 1 diastolic dysfunction). - Right ventricle: The cavity size was mildly dilated. Wall thickness was normal. Systolic function was mildly reduced. - Right atrium: The atrium was mildly dilated. - Atrial  septum: No atrial level shunt, at baseline with Doppler analysis. Prior ASD repair, CardioSeal 2004. - Pulmonic valve: Transvalvular velocity was increased, due to mild degree of stenosis. Peak velocity: 224cm/s (S). - Pulmonary arteries: Systolic pressure was mildly increased.    . 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     Past Surgical History:  Procedure Laterality Date  . BREAST LUMPECTOMY     left breast cancer, status post lumpectomy, radiation and tamoxifen 5 years  . CARPAL TUNNEL RELEASE    . CESAREAN SECTION     x4  . SENTINEL LYMPH NODE BIOPSY      Current Medications: Current Meds  Medication Sig  . aspirin 81 MG tablet Take 81 mg by mouth daily.    Marland Kitchen atorvastatin (LIPITOR) 40 MG tablet Take 1 tablet (40 mg total) by mouth daily.  . Cholecalciferol (VITAMIN D) 1000 UNITS capsule Take 1,000 Units by mouth daily.  . citalopram (CELEXA) 40 MG tablet TAKE 1 TABLET (40 MG TOTAL) BY MOUTH DAILY. Follow up appointment needed for further refills.  [name] Calcium (STOOL SOFTENER PO) Take 1 capsule by mouth as needed.   Marland Kitchen losartan (COZAAR) 25 MG tablet TAKE 1 TABLET BY MOUTH EVERY DAY  . Multiple Vitamin (MULTIVITAMIN) capsule Take 1 capsule by mouth daily.    . RESTASIS 0.05 % ophthalmic emulsion INSTILL 1 DROP INTO BOTH EYES TWICE A DAY     Allergies:   Patient has  no known allergies.   Social History   Socioeconomic History  . Marital status: Married    Spouse name: Not on file  . Number of children: 4  . Years of education: Not on file  . Highest education level: Not on file  Occupational History  . Occupation: retired  Tobacco Use  . Smoking status: Former Research scientist (life sciences)  . Smokeless tobacco: Never Used  Vaping Use  . Vaping Use: Never used  Substance and Sexual Activity  . Alcohol use: No  . Drug use: No  . Sexual activity: Yes    Partners: Male    Birth control/protection:  Post-menopausal  Other Topics Concern  . Not on file  Social History Narrative   Married. 4 children.    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   Takes a daily vitamin.   Smoke alarm in the home.   Wears her seatbelt.   Feels safe in her relationships.   Social Determinants of Health   Financial Resource Strain:   . Difficulty of Paying Living Expenses:   Food Insecurity:   . Worried About Charity fundraiser in the Last Year:   . Arboriculturist in the Last Year:   Transportation Needs:   . Film/video editor (Medical):   Marland Kitchen Lack of Transportation (Non-Medical):   Physical Activity:   . Days of Exercise per Week:   . Minutes of Exercise per Session:   Stress:   . Feeling of Stress :   Social Connections:   . Frequency of Communication with Friends and Family:   . Frequency of Social Gatherings with Friends and Family:   . Attends Religious Services:   . Active Member of Clubs or Organizations:   . Attends Archivist Meetings:   Marland Kitchen Marital Status:      Family History:  The patient's family history includes Arthritis in her mother; Dementia in her mother; Heart attack in her father and paternal grandmother; Heart disease in her father and paternal grandmother; Hypertension in her sister.   ROS:   Please see the history of present illness.    ROS All other systems reviewed and are negative.   PHYSICAL EXAM:   VS:  BP 110/66   Pulse 84   Ht 5\' 4"  (1.626 m)   Wt 154 lb (69.9 kg)   SpO2 93%   BMI 26.43 kg/m   Physical Exam  GEN: Well nourished, well developed, in no acute distress  Neck: no JVD, carotid bruits, or masses [id number]:KXF;8/1 systolic murmur LSB Respiratory:  clear to auscultation bilaterally, normal work of breathing GI: soft, nontender, nondistended, + BS Ext: without cyanosis, clubbing, or edema, Good distal pulses bilaterally Neuro:  Alert and Oriented x 3 Psych: euthymic mood, full affect  Wt Readings from Last 3 Encounters:  08/23/19 154  lb (69.9 kg)  07/11/19 151 lb 9.6 oz (68.8 kg)  02/07/19 155 lb (70.3 kg)      Studies/Labs Reviewed:   EKG:  EKG is not ordered today.    Recent Labs: 02/07/2019: Hemoglobin 14.8; Platelets 198.0; TSH 2.14   Lipid Panel    Component Value Date/Time   CHOL 144 07/21/2018 1039   TRIG 55.0 07/21/2018 1039   HDL 63.60 07/21/2018 1039   CHOLHDL 2 07/21/2018 1039   VLDL 11.0 07/21/2018 1039   LDLCALC 70 07/21/2018 1039    Additional studies/ records that were reviewed today include:  Echo 12/2018 IMPRESSIONS     1. Left ventricular ejection fraction, by visual estimation,  is 45%. The  left ventricle has mild to moderately decreased function. There is no left  ventricular hypertrophy.   2. The left ventricle demonstrates global hypokinesis.   3. Left ventricular diastolic parameters are consistent with Grade I  diastolic dysfunction (impaired relaxation).   4. Global right ventricle has normal systolic function.The right  ventricular size is mildly enlarged. No increase in right ventricular wall  thickness.   5. Left atrial size was normal.   6. Right atrial size was normal.   7. The mitral valve is normal in structure. Mild mitral valve  regurgitation. No evidence of mitral stenosis.   8. The tricuspid valve is normal in structure. Tricuspid valve  regurgitation mild-moderate.   9. The aortic valve is normal in structure. Aortic valve regurgitation is  trivial. No evidence of aortic valve sclerosis or stenosis.  10. The pulmonic valve was abnormal. Pulmonic valve regurgitation is  trivial.  11. Mild pulmonary valve stenosis. Peak velocity 2.05 m/s, peak gradient  17 mmHg.  12. There is borderline dilatation of the ascending aorta measuring 38 mm.  13. Mildly elevated pulmonary artery systolic pressure.  14. The inferior vena cava is normal in size with <50% respiratory  variability, suggesting right atrial pressure of 8 mmHg.  15. S/p ASD repair. No residual shunt  detected.   FINDINGS   Left Ventricle: Left ventricular ejection fraction, by visual estimation,  is 45%. The left ventricle has mild to moderately decreased function. The  left ventricle demonstrates global hypokinesis. There is no left  ventricular hypertrophy. Left  ventricular diastolic parameters are consistent with Grade I diastolic  dysfunction (impaired relaxation). Normal left atrial pressure.   Right Ventricle: The right ventricular size is mildly enlarged. No  increase in right ventricular wall thickness. Global RV systolic function  is has normal systolic function. The tricuspid regurgitant velocity is  2.88 m/s, and with an assumed right atrial   pressure of 8 mmHg, the estimated right ventricular systolic pressure is  mildly elevated at 41.1 mmHg.   Left Atrium: Left atrial size was normal in size.   Right Atrium: Right atrial size was normal in size   Pericardium: There is no evidence of pericardial effusion.   Mitral Valve: The mitral valve is normal in structure. Mild mitral valve  regurgitation. No evidence of mitral valve stenosis by observation.   Tricuspid Valve: The tricuspid valve is normal in structure. Tricuspid  valve regurgitation mild-moderate.   Aortic Valve: The aortic valve is normal in structure. Aortic valve  regurgitation is trivial. The aortic valve is structurally normal, with no  evidence of sclerosis or stenosis.   Pulmonic Valve: The pulmonic valve was abnormal. Pulmonic valve  regurgitation is trivial. Pulmonic regurgitation is trivial. Mild pulmonic  stenosis. Mild pulmonary valve stenosis. Peak velocity 2.05 m/s, peak  gradient 17 mmHg.   Aorta: The aortic root, ascending aorta and aortic arch are all  structurally normal, with no evidence of dilitation or obstruction. There  is borderline dilatation of the ascending aorta measuring 38 mm.   Venous: The inferior vena cava is normal in size with less than 50%  respiratory  variability, suggesting right atrial pressure of 8 mmHg.   IAS/Shunts: No atrial level shunt detected by color flow Doppler. S/p ASD  repair. No residual shunt detected.   NST 2018 [place] GATED SPECT [facility] PERF W/EXERCISE STRESS 1D Order# [id number] Reading physician: Larey Dresser, MD Ordering physician: Eileen Stanford, PA-C Study date: 03/18/16  Patient  Information  Name MRN Description  TENIQUA MARRON [id number] 79 y.o. female  Myocardial Perfusion Imaging: Result Notes Older Notes     Notes Recorded by Eileen Stanford, PA-C on 03/19/2016 at 9:55 AM EST Overall low risk study. Discussed with patient and verbalized understanding.       MyChart Results Release  MyChart Status: Active  Results Release  Vitals  Height Weight BMI (Calculated)  5\' 4"  (1.626 m) 152 lb 3.2 oz (69 kg) 26.2  Study Highlights     The left ventricular ejection fraction is normal (55-65%).  Nuclear stress EF: 57%.  There was no ST segment deviation noted during stress.  Blood pressure demonstrated a normal response to exercise.  This is a low risk study.   1. EF 57% with normal wall motion.  2. Fixed small, mild mid inferoseptal perfusion defect. Given normal wall motion, I suspect that this may represent attenuation.  No ischemia.  3. Low risk study.     ASSESSMENT:    1. NICM (nonischemic cardiomyopathy) ([place])   2. Chronic systolic CHF (congestive heart failure) ([place])   3. Nonrheumatic pulmonary valve stenosis   4. Status post device closure of ASD   5. Hyperlipidemia, unspecified hyperlipidemia type      PLAN:  In order of problems listed above:  NICM EF 45% on echo 12/2018-hasn't tolerated coreg and stopped it a couple of weeks ago. Says she feels better and not as much fatigue or dizziness. Will try low dose toprol xl to see if she tolerates. Continue losartan.  Chronic systolic CHF compensated.  Pulmonic Stenosis-mild on echo 12/2018  History of ASD  repair  HLD followed by PCP LDL 70 06/2018  Medication Adjustments/Labs and Tests Ordered: Current medicines are reviewed at length with the patient today.  Concerns regarding medicines are outlined above.  Medication changes, Labs and Tests ordered today are listed in the Patient Instructions below. There are no Patient Instructions on file for this visit.   Sumner Boast, PA-C  08/23/2019 10:37 AM    [facility] [address], [place], [place]  [address] Phone: [phone]; Fax: [phone]

## 2019-08-23 ENCOUNTER — Other Ambulatory Visit: Payer: Self-pay

## 2019-08-23 ENCOUNTER — Ambulatory Visit (INDEPENDENT_AMBULATORY_CARE_PROVIDER_SITE_OTHER): Payer: Medicare Other | Admitting: Physician Assistant

## 2019-08-23 ENCOUNTER — Encounter: Payer: Self-pay | Admitting: Physician Assistant

## 2019-08-23 VITALS — BP 110/66 | HR 84 | Ht 64.0 in | Wt 154.0 lb

## 2019-08-23 DIAGNOSIS — I428 Other cardiomyopathies: Secondary | ICD-10-CM

## 2019-08-23 DIAGNOSIS — I370 Nonrheumatic pulmonary valve stenosis: Secondary | ICD-10-CM | POA: Diagnosis not present

## 2019-08-23 DIAGNOSIS — I5022 Chronic systolic (congestive) heart failure: Secondary | ICD-10-CM

## 2019-08-23 DIAGNOSIS — Z8774 Personal history of (corrected) congenital malformations of heart and circulatory system: Secondary | ICD-10-CM | POA: Diagnosis not present

## 2019-08-23 DIAGNOSIS — E785 Hyperlipidemia, unspecified: Secondary | ICD-10-CM

## 2019-08-23 MED ORDER — METOPROLOL SUCCINATE ER 25 MG PO TB24: 12.5000 mg | ORAL_TABLET | Freq: Every day | ORAL | 3 refills | Status: DC

## 2019-08-23 NOTE — Patient Instructions (Signed)
Medication Instructions:  Your physician has recommended you make the following change in your medication:   START taking Metoprolol Succinate 25mg  (1/2 tablet 12.5mg ) daily  *If you need a refill on your cardiac medications before your next appointment, please call your pharmacy*   Lab Work: None If you have labs (blood work) drawn today and your tests are completely normal, you will receive your results only by: Marland Kitchen MyChart Message (if you have MyChart) OR . A paper copy in the mail If you have any lab test that is abnormal or we need to change your treatment, we will call you to review the results.   Testing/Procedures: None   Follow-Up: At [facility], you and your health needs are our priority.  As part of our continuing mission to provide you with exceptional heart care, we have created designated Provider Care Teams.  These Care Teams include your primary Cardiologist (physician) and Advanced Practice Providers (APPs -  Physician Assistants and Nurse Practitioners) who all work together to provide you with the care you need, when you need it.  We recommend signing up for the patient portal called "MyChart".  Sign up information is provided on this After Visit Summary.  MyChart is used to connect with patients for Virtual Visits (Telemedicine).  Patients are able to view lab/test results, encounter notes, upcoming appointments, etc.  Non-urgent messages can be sent to your provider as well.   To learn more about what you can do with MyChart, go to [email or web address].    Your next appointment:   11/28/2019   The format for your next appointment:   In Person  Provider:   Daneen Schick, MD   Other Instructions None

## 2019-11-11 ENCOUNTER — Other Ambulatory Visit: Payer: Self-pay | Admitting: Interventional Cardiology

## 2019-11-28 ENCOUNTER — Ambulatory Visit: Payer: Medicare Other | Admitting: Interventional Cardiology

## 2019-12-08 ENCOUNTER — Telehealth: Payer: Self-pay

## 2019-12-08 NOTE — Telephone Encounter (Signed)
Mammogram records rec'd from [facility]

## 2019-12-09 NOTE — Telephone Encounter (Signed)
Left detailed message per [facility]

## 2019-12-09 NOTE — Telephone Encounter (Signed)
Please inform patient the following information: Please inform patient her mammogram is normal.

## 2020-01-02 NOTE — Progress Notes (Signed)
Cardiology Office Note:    Date:  01/03/2020   ID:  Grace Fletcher, DOB Jun 11, 1940, MRN [id number]  PCP:  Ma Hillock, DO  Cardiologist:  Sinclair Grooms, MD   Referring MD: Ma Hillock, DO   Chief Complaint  Patient presents with  . Cardiac Valve Problem    Pulmonic stenosis  . Advice Only    Status post ASD repair  . Follow-up    Mild systolic dysfunction    History of Present Illness:    Grace Fletcher is a 79 y.o. female with a hx of pulmonic stenosis, prior percutaneous ASD repair, mild dilation of the right ventricle, asymptomatic chronic systolic HF (EF 52% by echo 2020 and 57% by nuclear wall motion study during [place]) and history of chest pain.  [name] feels relatively well but does complain of fatigue noticeably different than prior to Covid which she suffered in September 2020.  She also felt that carvedilol and Cozaar were causing lower blood pressures and contributing to fatigue.  Therefore, carvedilol was discontinued and 12.5 mg of Toprol-XL started this summer by [name].  She feels better but still has more fatigued than she thinks she should.  There is some dyspnea although this is not a major component.  She is not having chest pain or palpitations.  Past Medical History:  Diagnosis Date  . Anxiety 05/26/2013  . Arthritis   . Breast cancer, left breast ([place]) 2006   Lumpectomy, radiation and tamoxifen for 5 years.  . Chronic systolic heart failure ([place]) 05/14/2016  . Colon polyp   . Cyst of pancreas   . Depression 05/26/2013  . Migraines   . Moderate tricuspid regurgitation by prior echocardiogram 05/13/2010  . Osteopenia 12/04/2017  . Pneumonia due to COVID-19 virus   . Pulmonary hypertension ([place]) 05/13/2010  . Pulmonary stenosis 05/13/2010   Study Conclusions  - Left ventricle: The cavity size was normal. Systolic function was mildly reduced. The estimated ejection fraction was in the range of 45% to 50%. There is  hypokinesis of the anteroseptal myocardium. Doppler parameters are consistent with abnormal left ventricular relaxation (grade 1 diastolic dysfunction). - Right ventricle: The cavity size was mildly dilated. Wall thickness was normal. Systolic function was mildly reduced. - Right atrium: The atrium was mildly dilated. - Atrial septum: No atrial level shunt, at baseline with Doppler analysis. Prior ASD repair, CardioSeal 2004. - Pulmonic valve: Transvalvular velocity was increased, due to mild degree of stenosis. Peak velocity: 224cm/s (S). - Pulmonary arteries: Systolic pressure was mildly increased.    . 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   Card[facility]AL device, 2004; [facility]     Past Surgical History:  Procedure Laterality Date  . BREAST LUMPECTOMY     left breast cancer, status post lumpectomy, radiation and tamoxifen 5 years  . CARPAL TUNNEL RELEASE    . CESAREAN SECTION     x4  . SENTINEL LYMPH NODE BIOPSY      Current Medications: Current Meds  Medication Sig  . aspirin 81 MG tablet Take 81 mg by mouth daily.    Marland Kitchen atorvastatin (LIPITOR) 40 MG tablet Take 1 tablet (40 mg total) by mouth daily.  . Cholecalciferol (VITAMIN D) 1000 UNITS capsule Take 1,000 Units by mouth daily.  . citalopram (CELEXA) 40 MG tablet TAKE 1 TABLET (40 MG TOTAL) BY MOUTH DAILY. Follow up appointment needed for further refills.  [name] Calcium (STOOL SOFTENER PO) Take 1 capsule  by mouth as needed.   Marland Kitchen losartan (COZAAR) 25 MG tablet TAKE 1 TABLET BY MOUTH EVERY DAY  . metoprolol succinate (TOPROL XL) 25 MG 24 hr tablet Take 0.5 tablets (12.5 mg total) by mouth daily.  . Multiple Vitamin (MULTIVITAMIN) capsule Take 1 capsule by mouth daily.    . RESTASIS 0.05 % ophthalmic emulsion INSTILL 1 DROP INTO BOTH EYES TWICE A DAY     Allergies:   Patient has no known allergies.   Social History   Socioeconomic History  . Marital status: Married    Spouse  name: Not on file  . Number of children: 4  . Years of education: Not on file  . Highest education level: Not on file  Occupational History  . Occupation: retired  Tobacco Use  . Smoking status: Former Research scientist (life sciences)  . Smokeless tobacco: Never Used  Vaping Use  . Vaping Use: Never used  Substance and Sexual Activity  . Alcohol use: No  . Drug use: No  . Sexual activity: Yes    Partners: Male    Birth control/protection: Post-menopausal  Other Topics Concern  . Not on file  Social History Narrative   Married. 4 children.    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   Takes a daily vitamin.   Smoke alarm in the home.   Wears her seatbelt.   Feels safe in her relationships.   Social Determinants of Health   Financial Resource Strain:   . Difficulty of Paying Living Expenses: Not on file  Food Insecurity:   . Worried About Charity fundraiser in the Last Year: Not on file  . Ran Out of Food in the Last Year: Not on file  Transportation Needs:   . Lack of Transportation (Medical): Not on file  . Lack of Transportation (Non-Medical): Not on file  Physical Activity:   . Days of Exercise per Week: Not on file  . Minutes of Exercise per Session: Not on file  Stress:   . Feeling of Stress : Not on file  Social Connections:   . Frequency of Communication with Friends and Family: Not on file  . Frequency of Social Gatherings with Friends and Family: Not on file  . Attends Religious Services: Not on file  . Active Member of Clubs or Organizations: Not on file  . Attends Archivist Meetings: Not on file  . Marital Status: Not on file     Family History: The patient's family history includes Arthritis in her mother; Dementia in her mother; Heart attack in her father and paternal grandmother; Heart disease in her father and paternal grandmother; Hypertension in her sister.  ROS:   Please see the history of present illness.    Has some concern about her blood pressures at times  being too low since we added the heart failure therapy (losartan and beta-blocker).  Otherwise no complaints.  She does not sleep as well as she would like.  She has to do a lot more around the house because Mr. Coolidge Breeze son is getting progressively weaker and frail.  All other systems reviewed and are negative.  EKGs/Labs/Other Studies Reviewed:    The following studies were reviewed today:    ECHOCARDIOGRAM 2020: IMPRESSIONS  1. Left ventricular ejection fraction, by visual estimation, is 45%. The  left ventricle has mild to moderately decreased function. There is no left  ventricular hypertrophy.  2. The left ventricle demonstrates global hypokinesis.  3. Left ventricular diastolic parameters are consistent with Grade I  diastolic dysfunction (impaired relaxation).  4. Global right ventricle has normal systolic function.The right  ventricular size is mildly enlarged. No increase in right ventricular wall  thickness.  5. Left atrial size was normal.  6. Right atrial size was normal.  7. The mitral valve is normal in structure. Mild mitral valve  regurgitation. No evidence of mitral stenosis.  8. The tricuspid valve is normal in structure. Tricuspid valve  regurgitation mild-moderate.  9. The aortic valve is normal in structure. Aortic valve regurgitation is  trivial. No evidence of aortic valve sclerosis or stenosis.  10. The pulmonic valve was abnormal. Pulmonic valve regurgitation is  trivial.  11. Mild pulmonary valve stenosis. Peak velocity 2.05 m/s, peak gradient  17 mmHg.  12. There is borderline dilatation of the ascending aorta measuring 38 mm.  13. Mildly elevated pulmonary artery systolic pressure.  14. The inferior vena cava is normal in size with <50% respiratory  variability, suggesting right atrial pressure of 8 mmHg.  15. S/p ASD repair. No residual shunt detected.   EKG:  EKG  Recent Labs: 02/07/2019: Hemoglobin 14.8; Platelets 198.0; TSH 2.14  Recent  Lipid Panel    Component Value Date/Time   CHOL 144 07/21/2018 1039   TRIG 55.0 07/21/2018 1039   HDL 63.60 07/21/2018 1039   CHOLHDL 2 07/21/2018 1039   VLDL 11.0 07/21/2018 1039   LDLCALC 70 07/21/2018 1039    Physical Exam:    VS:  BP 116/68   Pulse (!) 58   Ht 5\' 4"  (1.626 m)   Wt 153 lb (69.4 kg)   SpO2 96%   BMI 26.26 kg/m     Wt Readings from Last 3 Encounters:  01/03/20 153 lb (69.4 kg)  08/23/19 154 lb (69.9 kg)  07/11/19 151 lb 9.6 oz (68.8 kg)     GEN: Appears healthy.. No acute distress HEENT: Normal NECK: No JVD. LYMPHATICS: No lymphadenopathy CARDIAC:  RRR with soft relatively short left upper systolic murmur without a diastolic component.  No obvious gallop, or edema. VASCULAR:  Normal Pulses. No bruits. RESPIRATORY:  Clear to auscultation without rales, wheezing or rhonchi  ABDOMEN: Soft, non-tender, non-distended, No pulsatile mass, MUSCULOSKELETAL: No deformity  SKIN: Warm and dry NEUROLOGIC:  Alert and oriented x 3 PSYCHIATRIC:  Normal affect   ASSESSMENT:    1. Chronic systolic heart failure ([place])   2. Nonrheumatic pulmonary valve stenosis   3. Hyperlipidemia, unspecified hyperlipidemia type   4. Moderate tricuspid regurgitation by prior echocardiogram   5. Status post device closure of ASD   6. Educated about COVID-19 virus infection    PLAN:    In order of problems listed above:  1. 2D Doppler echocardiogram will be done to rule out the possibility of exertional fatigue related to worsening LV function.  This will also help [place] to reassess severity of congenital pulmonic stenosis.  She is also status post ASD closure.\ 2. Clinically present without significant change in exam-year-old year. 3. Lipitor therapy 40 mg/day is resulting in an LDL cholesterol less than or equal to 70.  Has upcoming blood work with her primary physician. 4. There is a systolic murmur but it seems to be more compatible with pulmonic stenosis.  Echo will help to  reassess. 5. Please see #1 above. 6. Vaccinated and has previously suffered COVID-19 disease.  Still practicing mitigation.  Has not fully recovered from COVID-19 14 months ago relative to her endurance.   Medication Adjustments/Labs and Tests Ordered: Current medicines are reviewed at  length with the patient today.  Concerns regarding medicines are outlined above.  Orders Placed This Encounter  Procedures  . EKG 12-Lead   No orders of the defined types were placed in this encounter.   Patient Instructions  Medication Instructions:  Your physician recommends that you continue on your current medications as directed. Please refer to the Current Medication list given to you today.  *If you need a refill on your cardiac medications before your next appointment, please call your pharmacy*   Lab Work: None If you have labs (blood work) drawn today and your tests are completely normal, you will receive your results only by: Marland Kitchen MyChart Message (if you have MyChart) OR . A paper copy in the mail If you have any lab test that is abnormal or we need to change your treatment, we will call you to review the results.   Testing/Procedures: None   Follow-Up: At [facility], you and your health needs are our priority.  As part of our continuing mission to provide you with exceptional heart care, we have created designated Provider Care Teams.  These Care Teams include your primary Cardiologist (physician) and Advanced Practice Providers (APPs -  Physician Assistants and Nurse Practitioners) who all work together to provide you with the care you need, when you need it.  We recommend signing up for the patient portal called "MyChart".  Sign up information is provided on this After Visit Summary.  MyChart is used to connect with patients for Virtual Visits (Telemedicine).  Patients are able to view lab/test results, encounter notes, upcoming appointments, etc.  Non-urgent messages can be sent to  your provider as well.   To learn more about what you can do with MyChart, go to [email or web address].    Your next appointment:   9-12 month(s)  The format for your next appointment:   In Person  Provider:   You may see Sinclair Grooms, MD or one of the following Advanced Practice Providers on your designated Care Team:    Truitt Merle, NP  Cecilie Kicks, NP  Kathyrn Drown, NP    Other Instructions      Signed, Sinclair Grooms, MD  01/03/2020 5:09 PM    [place]

## 2020-01-03 ENCOUNTER — Other Ambulatory Visit: Payer: Self-pay

## 2020-01-03 ENCOUNTER — Encounter: Payer: Self-pay | Admitting: Interventional Cardiology

## 2020-01-03 ENCOUNTER — Ambulatory Visit (INDEPENDENT_AMBULATORY_CARE_PROVIDER_SITE_OTHER): Payer: Medicare Other | Admitting: Interventional Cardiology

## 2020-01-03 VITALS — BP 116/68 | HR 58 | Ht 64.0 in | Wt 153.0 lb

## 2020-01-03 DIAGNOSIS — E785 Hyperlipidemia, unspecified: Secondary | ICD-10-CM | POA: Diagnosis not present

## 2020-01-03 DIAGNOSIS — I370 Nonrheumatic pulmonary valve stenosis: Secondary | ICD-10-CM

## 2020-01-03 DIAGNOSIS — I071 Rheumatic tricuspid insufficiency: Secondary | ICD-10-CM | POA: Diagnosis not present

## 2020-01-03 DIAGNOSIS — I5022 Chronic systolic (congestive) heart failure: Secondary | ICD-10-CM | POA: Diagnosis not present

## 2020-01-03 DIAGNOSIS — R0602 Shortness of breath: Secondary | ICD-10-CM

## 2020-01-03 DIAGNOSIS — Z8774 Personal history of (corrected) congenital malformations of heart and circulatory system: Secondary | ICD-10-CM

## 2020-01-03 DIAGNOSIS — Z7189 Other specified counseling: Secondary | ICD-10-CM

## 2020-01-03 NOTE — Patient Instructions (Signed)
Medication Instructions:  Your physician recommends that you continue on your current medications as directed. Please refer to the Current Medication list given to you today.  *If you need a refill on your cardiac medications before your next appointment, please call your pharmacy*   Lab Work: None If you have labs (blood work) drawn today and your tests are completely normal, you will receive your results only by: . MyChart Message (if you have MyChart) OR . A paper copy in the mail If you have any lab test that is abnormal or we need to change your treatment, we will call you to review the results.   Testing/Procedures: None   Follow-Up: At [facility], you and your health needs are our priority.  As part of our continuing mission to provide you with exceptional heart care, we have created designated Provider Care Teams.  These Care Teams include your primary Cardiologist (physician) and Advanced Practice Providers (APPs -  Physician Assistants and Nurse Practitioners) who all work together to provide you with the care you need, when you need it.  We recommend signing up for the patient portal called "MyChart".  Sign up information is provided on this After Visit Summary.  MyChart is used to connect with patients for Virtual Visits (Telemedicine).  Patients are able to view lab/test results, encounter notes, upcoming appointments, etc.  Non-urgent messages can be sent to your provider as well.   To learn more about what you can do with MyChart, go to [email or web address].    Your next appointment:   9-12 month(s)  The format for your next appointment:   In Person  Provider:   You may see Henry W Smith III, MD or one of the following Advanced Practice Providers on your designated Care Team:    Lori Gerhardt, NP  Laura Ingold, NP  Jill McDaniel, NP    Other Instructions   

## 2020-02-04 ENCOUNTER — Other Ambulatory Visit: Payer: Self-pay | Admitting: Family Medicine

## 2020-02-04 DIAGNOSIS — F32A Depression, unspecified: Secondary | ICD-10-CM

## 2020-02-04 DIAGNOSIS — F419 Anxiety disorder, unspecified: Secondary | ICD-10-CM

## 2020-02-08 ENCOUNTER — Other Ambulatory Visit: Payer: Self-pay | Admitting: Family Medicine

## 2020-03-02 ENCOUNTER — Other Ambulatory Visit: Payer: Self-pay | Admitting: Family Medicine

## 2020-03-02 DIAGNOSIS — F32A Depression, unspecified: Secondary | ICD-10-CM

## 2020-03-02 DIAGNOSIS — F419 Anxiety disorder, unspecified: Secondary | ICD-10-CM

## 2020-05-21 ENCOUNTER — Telehealth: Payer: Self-pay

## 2020-05-21 DIAGNOSIS — F419 Anxiety disorder, unspecified: Secondary | ICD-10-CM

## 2020-05-21 DIAGNOSIS — F32A Depression, unspecified: Secondary | ICD-10-CM

## 2020-05-21 NOTE — Telephone Encounter (Signed)
Patient refill request. Patient stated that she has a lot going on with her husband right now, and his appts, unable to come in for follow up with Dr. Raoul Pitch until end of May.  Scheduled for 5/25 with Dr. Raoul Pitch  citalopram [facility]) 40 MG tablet [[id number]]    [place]

## 2020-05-21 NOTE — Telephone Encounter (Signed)
Patient refill request -

## 2020-05-22 MED ORDER — CITALOPRAM HYDROBROMIDE 40 MG PO TABS: ORAL_TABLET | ORAL | 0 refills | Status: DC

## 2020-05-22 NOTE — Telephone Encounter (Signed)
30 d/s sent 

## 2020-05-25 ENCOUNTER — Telehealth: Payer: Self-pay | Admitting: Family Medicine

## 2020-05-25 NOTE — Telephone Encounter (Signed)
Left message for patient to schedule Annual Wellness Visit.  Please schedule with Nurse Health Advisor Martha Stanley, RN at [facility]  °

## 2020-06-08 ENCOUNTER — Ambulatory Visit: Payer: Medicare Other | Admitting: Family Medicine

## 2020-06-08 DIAGNOSIS — F419 Anxiety disorder, unspecified: Secondary | ICD-10-CM

## 2020-06-08 DIAGNOSIS — F32A Depression, unspecified: Secondary | ICD-10-CM

## 2020-06-14 ENCOUNTER — Other Ambulatory Visit: Payer: Self-pay | Admitting: Family Medicine

## 2020-06-14 DIAGNOSIS — F32A Depression, unspecified: Secondary | ICD-10-CM

## 2020-06-14 DIAGNOSIS — F419 Anxiety disorder, unspecified: Secondary | ICD-10-CM

## 2020-06-15 ENCOUNTER — Ambulatory Visit (INDEPENDENT_AMBULATORY_CARE_PROVIDER_SITE_OTHER): Payer: Medicare Other | Admitting: Family Medicine

## 2020-06-15 ENCOUNTER — Other Ambulatory Visit: Payer: Self-pay

## 2020-06-15 ENCOUNTER — Encounter: Payer: Self-pay | Admitting: Family Medicine

## 2020-06-15 VITALS — BP 104/64 | HR 68 | Temp 98.0°F | Ht 64.0 in | Wt 159.0 lb

## 2020-06-15 DIAGNOSIS — I272 Pulmonary hypertension, unspecified: Secondary | ICD-10-CM

## 2020-06-15 DIAGNOSIS — F32A Depression, unspecified: Secondary | ICD-10-CM

## 2020-06-15 DIAGNOSIS — F419 Anxiety disorder, unspecified: Secondary | ICD-10-CM | POA: Diagnosis not present

## 2020-06-15 DIAGNOSIS — E785 Hyperlipidemia, unspecified: Secondary | ICD-10-CM

## 2020-06-15 DIAGNOSIS — I5022 Chronic systolic (congestive) heart failure: Secondary | ICD-10-CM

## 2020-06-15 DIAGNOSIS — M8580 Other specified disorders of bone density and structure, unspecified site: Secondary | ICD-10-CM

## 2020-06-15 DIAGNOSIS — Z79899 Other long term (current) drug therapy: Secondary | ICD-10-CM

## 2020-06-15 MED ORDER — CITALOPRAM HYDROBROMIDE 40 MG PO TABS: ORAL_TABLET | ORAL | 1 refills | Status: DC

## 2020-06-15 MED ORDER — ATORVASTATIN CALCIUM 40 MG PO TABS
40.0000 mg | ORAL_TABLET | Freq: Every day | ORAL | 3 refills | Status: DC
Start: 2020-06-15 — End: 2021-03-27

## 2020-06-15 MED ORDER — LORAZEPAM 0.5 MG PO TABS: 0.2500 mg | ORAL_TABLET | Freq: Two times a day (BID) | ORAL | 5 refills | Status: DC | PRN

## 2020-06-15 NOTE — Patient Instructions (Addendum)
I am so sorry your husband is back in the hospital. I will keep you both in my prayers.   continue Celexa daily  Start ativan 1/2 - 1 tab up to twice a day if needed.    We will call you with all lab results.

## 2020-06-15 NOTE — Progress Notes (Signed)
[name], female  DOB: [birth year], 80 y.o.   MRN: [id number] Patient Care Team    Relationship Specialty Notifications Start End  Ma Hillock, DO PCP - General Family Medicine  03/03/17   Belva Crome, MD PCP - Cardiology Cardiology Admissions 05/07/17   Magrinat, Virgie Dad, MD Consulting Physician Oncology  03/03/17   Renelda Loma, OD  Optometry  03/03/17   Dian Queen, MD Consulting Physician Obstetrics and Gynecology  03/03/17     Chief Complaint  Patient presents with  . Follow-up  . Anxiety    Subjective: Grace Fletcher is a 80 y.o.  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  She reports compliance w/  losartan 25 mg daily and metoprolol which is supplied by cardiology. Patient denies chest pain, shortness of breath, dizziness or lower extremity edema.   Pt takes daily baby ASA. Pt is  prescribed statin. Diet: Low-sodium Exercise: Routine exercise RF: Pulmonary hypertension, family history heart disease, hyperlipidemia  Depression: Patient reports she was started on Celexa many years ago when she was diagnosed with breast cancer. She had discontinued until her son had become incarcerated.  SHe has been on medication since.   She feels the medication has worked well for her at Celexa 40 mg daily.  Her son is now home and living with her and her husband.  Seems to be working out well for them and he is helping around the home.  Her husband unfortunately is in the hospital and has not been doing very well.  They are having a very difficult time keeping the fluid off of him despite high-dose diuretics.  Depression screen [facility] 2/9 06/15/2020 02/07/2019 07/21/2018 11/19/2017 03/03/2017  Decreased Interest 1 0 0 0 0  Down, Depressed, Hopeless 0 0 0 0 1  PHQ - 2  Score 1 0 0 0 1  Altered sleeping 1 - 0 0 0  Tired, decreased energy 1 - _0 Change in appetite 0 - 0 0 1  Feeling bad or failure about yourself  0 - 0 0 0  Trouble concentrating 0 - 0 0 0  Moving slowly or fidgety/restless 0 - 0 0 0  Suicidal thoughts 0 - 0 0 0  PHQ-9 Score 3 - _1 Difficult doing work/chores - - Not difficult at all Not difficult at all -   GAD 7 : Generalized Anxiety Score 06/15/2020 07/21/2018 03/03/2017  Nervous, Anxious, on Edge 2 0 0  Control/stop worrying 2 0 0  Worry too much - different things 2 1 0  Trouble relaxing 2 0 0  Restless 0 0 0  Easily annoyed or irritable 0 0 0  Afraid - awful might happen - 0 0  Total GAD 7 Score - 1 0  Anxiety Difficulty - Not difficult at all -   Immunization History  Administered Date(s) Administered  . DTaP 04/13/2015  . Influenza, High Dose Seasonal PF 11/27/2016, 11/19/2017, 12/26/2018, 12/26/2018  . Influenza,inj,Quad PF,6+ Mos 11/28/2018  . PFIZER(Purple Top)SARS-COV-2 Vaccination 02/08/2019, 03/07/2019, 04/01/2019, 11/24/2019  . Pneumococcal Conjugate-13 02/07/2019  . Pneumococcal Polysaccharide-23 11/19/2017   Past Medical History:  Diagnosis Date  . Anxiety 05/26/2013  . Arthritis   . Breast cancer, left breast ([place]) 2006   Lumpectomy, radiation and tamoxifen for 5 years.  . Chronic systolic heart failure (  [place]) 05/14/2016  . Colon polyp   . Cyst of pancreas   . Depression 05/26/2013  . Migraines   . Moderate tricuspid regurgitation by prior echocardiogram 05/13/2010  . Osteopenia 12/04/2017  . Pneumonia due to COVID-19 virus   . Pulmonary hypertension ([place]) 05/13/2010  . Pulmonary stenosis 05/13/2010   Study Conclusions  - Left ventricle: The cavity size was normal. Systolic function was mildly reduced. The estimated ejection fraction was in the range of 45% to 50%. There is hypokinesis of the anteroseptal myocardium. Doppler parameters are consistent with abnormal left ventricular relaxation  (grade 1 diastolic dysfunction). - Right ventricle: The cavity size was mildly dilated. Wall thickness was normal. Systolic function was mildly reduced. - Right atrium: The atrium was mildly dilated. - Atrial septum: No atrial level shunt, at baseline with Doppler analysis. Prior ASD repair, CardioSeal 2004. - Pulmonic valve: Transvalvular velocity was increased, due to mild degree of stenosis. Peak velocity: 224cm/s (S). - Pulmonary arteries: Systolic pressure was mildly increased.    . 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 BREAST LUMPECTOMY     left breast cancer, status post lumpectomy, radiation and tamoxifen 5 years  . CARPAL TUNNEL RELEASE    . CESAREAN SECTION     x4  . SENTINEL LYMPH NODE BIOPSY     Family History  Problem Relation Age of Onset  . Dementia Mother   . Arthritis Mother   . Heart attack Father   . Heart disease Father   . Hypertension Sister   . Heart attack Paternal Grandmother   . Heart disease Paternal Grandmother    Social History   Social History Narrative   Married. 4 children.    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   Takes a daily vitamin.   Smoke alarm in the home.   Wears her seatbelt.   Feels safe in her relationships.    Allergies as of 06/15/2020   No Known Allergies     Medication List       Accurate as of Jun 15, 2020 11:59 PM. If you have any questions, ask your nurse or doctor.        aspirin 81 MG tablet Take 81 mg by mouth daily.   atorvastatin 40 MG tablet Commonly known as: LIPITOR Take 1 tablet (40 mg total) by mouth daily.   citalopram 40 MG tablet Commonly known as: CELEXA TAKE 1 TABLET (40 MG TOTAL) BY MOUTH DAILY. What changed: additional instructions Changed by: Howard Pouch, DO   LORazepam 0.5 MG tablet Commonly known as: ATIVAN Take 0.5-1 tablets (0.25-0.5  mg total) by mouth 2 (two) times daily as needed for anxiety. Started by: Howard Pouch, DO   losartan 25 MG tablet Commonly known as: COZAAR TAKE 1 TABLET BY MOUTH EVERY DAY   metoprolol succinate 25 MG 24 hr tablet Commonly known as: Toprol XL Take 0.5 tablets (12.5 mg total) by mouth daily.   multivitamin capsule Take 1 capsule by mouth daily.   Restasis 0.05 % ophthalmic emulsion Generic drug: cycloSPORINE INSTILL 1 DROP INTO BOTH EYES TWICE A DAY   STOOL SOFTENER PO Take 1 capsule by mouth as needed.   Vitamin D 1000 units capsule Take 1,000 Units by mouth daily.       All past medical history, surgical history, allergies, family history, immunizations andmedications were updated in the EMR today and  reviewed under the history and medication portions of their EMR.     Recent Results (from the past 2160 hour(s))  CBC     Status: Abnormal   Collection Time: 06/15/20  1:41 PM  Result Value Ref Range   WBC 5.9 3.8 - 10.8 Thousand/uL   RBC 4.58 3.80 - 5.10 Million/uL   Hemoglobin 14.7 11.7 - 15.5 g/dL   HCT 45.3 (H) 35.0 - 45.0 %   MCV 98.9 80.0 - 100.0 fL   MCH 32.1 27.0 - 33.0 pg   MCHC 32.5 32.0 - 36.0 g/dL   RDW 12.9 11.0 - 15.0 %   Platelets 181 140 - 400 Thousand/uL   MPV 10.8 7.5 - 12.5 fL  Comp Met (CMET)     Status: None   Collection Time: 06/15/20  1:41 PM  Result Value Ref Range   Glucose, Bld 84 65 - 99 mg/dL    Comment: .            Fasting reference interval .    BUN 14 7 - 25 mg/dL   Creat 0.79 0.60 - 0.93 mg/dL    Comment: For patients >27 years of age, the reference limit for Creatinine is approximately 13% higher for people identified as African-American. .    BUN/Creatinine Ratio NOT APPLICABLE 6 - 22 (calc)   Sodium 139 135 - 146 mmol/L   Potassium 4.0 3.5 - 5.3 mmol/L   Chloride 105 98 - 110 mmol/L   CO2 22 20 - 32 mmol/L   Calcium 9.5 8.6 - 10.4 mg/dL   Total Protein 6.5 6.1 - 8.1 g/dL   Albumin 4.1 3.6 - 5.1 g/dL   Globulin 2.4  1.9 - 3.7 g/dL (calc)   AG Ratio 1.7 1.0 - 2.5 (calc)   Total Bilirubin 0.7 0.2 - 1.2 mg/dL   Alkaline phosphatase (APISO) 90 37 - 153 U/L   AST 26 10 - 35 U/L   ALT 14 6 - 29 U/L  TSH     Status: None   Collection Time: 06/15/20  1:41 PM  Result Value Ref Range   TSH 2.82 0.40 - 4.50 mIU/L  Direct LDL     Status: None   Collection Time: 06/15/20  1:41 PM  Result Value Ref Range   Direct LDL 82 <100 mg/dL    Comment: Greatly elevated Triglycerides values (>1200 mg/dL) interfere with the dLDL assay. As no Triglycerides  testing was ordered, interpret results with caution. . Desirable range <100 mg/dL for primary prevention;   <70 mg/dL for patients with CHD or diabetic patients  with > or = 2 CHD risk factors. .    ROS: 14 pt review of systems performed and negative (unless mentioned in an HPI)  Objective: BP 104/64   Pulse 68   Temp 98 F (36.7 C) (Oral)   Ht _0  (1.626 m)   Wt 159 lb (72.1 kg)   SpO2 99%   BMI 27.29 kg/m  Gen: Afebrile. No acute distress.  Nontoxic, very pleasant female. HENT: AT. [place].  Eyes:Pupils Equal Round Reactive to light, Extraocular movements intact,  Conjunctiva without redness, discharge or icterus. Neck/lymp/endocrine: Supple, no lymphadenopathy, no thyromegaly CV: RRR, no edema Chest: CTAB, no wheeze or crackles Neuro:  Normal gait. PERLA. EOMi. Alert. Oriented x3 Psych: Tearful today.  Normal affect, dress and demeanor. Normal speech. Normal thought content and judgment.    No exam data present  Assessment/plan: Wanetta Funderburke Milson is a 80 y.o. female present for CPE  Depression, unspecified depression type/Anxiety Patient's husband has been in the hospital and they have concerns his health is declining from a cardiorenal standpoint. Continue Celexa 40 mg daily. Anxiety is mildly increased, rightfully so with the climbing health of her husband.  She had been placed on Ativan short-term in the past.  Discussed medication use with her  today and she understands it has the potential of addiction but very mild properties.  Use sparingly. Ativan 0.5 mg twice daily as needed prescribed to her today.  [place] controlled substance database reviewed today. Follow-up 5.5 months for chronic medical conditions.   Chronic systolic heart failure (HCC)/hyperlipidemia/statin therapy/overweight/pulmonary hypertension Stable.  Managed by cardiology.  -Continue Cozaar, metoprolol>> meds managed by cardiology. -Continue baby aspirin. -Continue Lipitor -CBC, CMP, TSH and LDL collected today.   Return in about 6 months (around 12/03/2020) for [place] (30 min).  Orders Placed This Encounter  Procedures  . CBC  . Comp Met (CMET)  . TSH  . Direct LDL   Meds ordered this encounter  Medications  . citalopram (CELEXA) 40 MG tablet    Sig: TAKE 1 TABLET (40 MG TOTAL) BY MOUTH DAILY.    Dispense:  90 tablet    Refill:  1  . atorvastatin (LIPITOR) 40 MG tablet    Sig: Take 1 tablet (40 mg total) by mouth daily.    Dispense:  90 tablet    Refill:  3  . LORazepam (ATIVAN) 0.5 MG tablet    Sig: Take 0.5-1 tablets (0.25-0.5 mg total) by mouth 2 (two) times daily as needed for anxiety.    Dispense:  60 tablet    Refill:  5   Referral Orders  No referral(s) requested today     Electronically signed by: Howard Pouch, [place]

## 2020-06-16 LAB — COMPREHENSIVE METABOLIC PANEL
AG Ratio: 1.7 (calc) (ref 1.0–2.5)
ALT: 14 U/L (ref 6–29)
AST: 26 U/L (ref 10–35)
Albumin: 4.1 g/dL (ref 3.6–5.1)
Alkaline phosphatase (APISO): 90 U/L (ref 37–153)
BUN: 14 mg/dL (ref 7–25)
CO2: 22 mmol/L (ref 20–32)
Calcium: 9.5 mg/dL (ref 8.6–10.4)
Chloride: 105 mmol/L (ref 98–110)
Creat: 0.79 mg/dL (ref 0.60–0.93)
Globulin: 2.4 g/dL (calc) (ref 1.9–3.7)
Glucose, Bld: 84 mg/dL (ref 65–99)
Potassium: 4 mmol/L (ref 3.5–5.3)
Sodium: 139 mmol/L (ref 135–146)
Total Bilirubin: 0.7 mg/dL (ref 0.2–1.2)
Total Protein: 6.5 g/dL (ref 6.1–8.1)

## 2020-06-16 LAB — CBC
HCT: 45.3 % — ABNORMAL HIGH (ref 35.0–45.0)
Hemoglobin: 14.7 g/dL (ref 11.7–15.5)
MCH: 32.1 pg (ref 27.0–33.0)
MCHC: 32.5 g/dL (ref 32.0–36.0)
MCV: 98.9 fL (ref 80.0–100.0)
MPV: 10.8 fL (ref 7.5–12.5)
Platelets: 181 10*3/uL (ref 140–400)
RBC: 4.58 10*6/uL (ref 3.80–5.10)
RDW: 12.9 % (ref 11.0–15.0)
WBC: 5.9 10*3/uL (ref 3.8–10.8)

## 2020-06-16 LAB — TSH: TSH: 2.82 mIU/L (ref 0.40–4.50)

## 2020-06-16 LAB — LDL CHOLESTEROL, DIRECT: Direct LDL: 82 mg/dL (ref <100)

## 2020-06-20 ENCOUNTER — Ambulatory Visit: Payer: Medicare Other | Admitting: Family Medicine

## 2020-08-02 ENCOUNTER — Other Ambulatory Visit: Payer: Self-pay | Admitting: Interventional Cardiology

## 2020-09-06 ENCOUNTER — Other Ambulatory Visit: Payer: Self-pay | Admitting: Interventional Cardiology

## 2020-10-20 ENCOUNTER — Other Ambulatory Visit: Payer: Self-pay | Admitting: Physician Assistant

## 2020-12-10 ENCOUNTER — Other Ambulatory Visit: Payer: Self-pay | Admitting: Family Medicine

## 2020-12-10 DIAGNOSIS — F32A Depression, unspecified: Secondary | ICD-10-CM

## 2020-12-10 DIAGNOSIS — F419 Anxiety disorder, unspecified: Secondary | ICD-10-CM

## 2021-01-04 ENCOUNTER — Other Ambulatory Visit: Payer: Self-pay | Admitting: Family Medicine

## 2021-01-04 DIAGNOSIS — F419 Anxiety disorder, unspecified: Secondary | ICD-10-CM

## 2021-01-04 DIAGNOSIS — F32A Depression, unspecified: Secondary | ICD-10-CM

## 2021-01-09 ENCOUNTER — Other Ambulatory Visit: Payer: Self-pay | Admitting: Physician Assistant

## 2021-01-23 ENCOUNTER — Other Ambulatory Visit: Payer: Self-pay | Admitting: Interventional Cardiology

## 2021-02-04 ENCOUNTER — Telehealth: Payer: Self-pay

## 2021-02-04 ENCOUNTER — Other Ambulatory Visit: Payer: Self-pay

## 2021-02-04 DIAGNOSIS — F32A Depression, unspecified: Secondary | ICD-10-CM

## 2021-02-04 DIAGNOSIS — F419 Anxiety disorder, unspecified: Secondary | ICD-10-CM

## 2021-02-04 MED ORDER — CITALOPRAM HYDROBROMIDE 40 MG PO TABS: ORAL_TABLET | ORAL | 0 refills | Status: DC

## 2021-02-04 NOTE — Telephone Encounter (Signed)
Patient refill request.  She stated she has about 1 week of meds left.  Her husband dies last year and has had an awful time dealing with his loss. Patient scheduled first available appt on 1/30 with Dr. Raoul Pitch.  [facility]  citalopram (CELEXA) 40 MG tablet [[id number]]

## 2021-02-04 NOTE — Telephone Encounter (Signed)
Rx sent for 30 d/s. Pt must keep appt for further refills

## 2021-02-06 ENCOUNTER — Telehealth: Payer: Self-pay | Admitting: Interventional Cardiology

## 2021-02-06 NOTE — Telephone Encounter (Signed)
I spoke with the pt and she reports that she would only like to talk with Grace Fletcher... she says that it is a "personal" message that Grace Fletcher has helped her with in the past... I explained to her that Grace Fletcher is not back in the office until tomorrow but she says that is okay... she will be out of her house between 1-3 pm.   Will forward to [place].Marland Kitchen

## 2021-02-06 NOTE — Telephone Encounter (Signed)
Patient had a question and she only wanted to talk to [facility], Dr. Thompson Caul Nurse.

## 2021-02-07 NOTE — Telephone Encounter (Signed)
Spoke with wife and she had questions about her husband's last EF.  Husband passed away in 09-15-2022 and she has just been worried that she didn't do enough or made wrong decisions about what to do.  Discussed information with pt and reassured her that she did nothing wrong.  Pt appreciative for call.

## 2021-02-25 ENCOUNTER — Other Ambulatory Visit: Payer: Self-pay

## 2021-02-25 ENCOUNTER — Encounter: Payer: Self-pay | Admitting: Family Medicine

## 2021-02-25 ENCOUNTER — Ambulatory Visit (INDEPENDENT_AMBULATORY_CARE_PROVIDER_SITE_OTHER): Payer: Medicare Other | Admitting: Family Medicine

## 2021-02-25 VITALS — BP 117/66 | HR 71 | Temp 98.3°F | Wt 159.4 lb

## 2021-02-25 DIAGNOSIS — I071 Rheumatic tricuspid insufficiency: Secondary | ICD-10-CM

## 2021-02-25 DIAGNOSIS — I5022 Chronic systolic (congestive) heart failure: Secondary | ICD-10-CM | POA: Diagnosis not present

## 2021-02-25 DIAGNOSIS — Z8774 Personal history of (corrected) congenital malformations of heart and circulatory system: Secondary | ICD-10-CM | POA: Diagnosis not present

## 2021-02-25 DIAGNOSIS — F419 Anxiety disorder, unspecified: Secondary | ICD-10-CM

## 2021-02-25 DIAGNOSIS — F32A Depression, unspecified: Secondary | ICD-10-CM | POA: Diagnosis not present

## 2021-02-25 DIAGNOSIS — E785 Hyperlipidemia, unspecified: Secondary | ICD-10-CM

## 2021-02-25 DIAGNOSIS — I272 Pulmonary hypertension, unspecified: Secondary | ICD-10-CM

## 2021-02-25 DIAGNOSIS — I370 Nonrheumatic pulmonary valve stenosis: Secondary | ICD-10-CM

## 2021-02-25 MED ORDER — CITALOPRAM HYDROBROMIDE 40 MG PO TABS: ORAL_TABLET | ORAL | 1 refills | Status: DC

## 2021-02-25 MED ORDER — LORAZEPAM 0.5 MG PO TABS: 0.2500 mg | ORAL_TABLET | Freq: Two times a day (BID) | ORAL | 5 refills | Status: DC | PRN

## 2021-02-25 NOTE — Patient Instructions (Addendum)
°  Next appt labs if not collected to by cardio in April.

## 2021-02-25 NOTE — Progress Notes (Signed)
[name], female  DOB: 02/09/1940, 81 y.o.   MRN: [id number] Patient Care Team    Relationship Specialty Notifications Start End  Ma Hillock, DO PCP - General Family Medicine  03/03/17   Belva Crome, MD PCP - Cardiology Cardiology Admissions 05/07/17   Magrinat, Virgie Dad, MD Consulting Physician Oncology  03/03/17   Renelda Loma, OD  Optometry  03/03/17   Dian Queen, MD Consulting Physician Obstetrics and Gynecology  03/03/17     Chief Complaint  Patient presents with   Follow-up    Subjective: Grace Fletcher is a 81 y.o.  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  She reports compliance w/  losartan 25 mg daily, but admits to not taking the metoprolol or the Lipitor any longer.  She reports the metoprolol was making her feel very tired and the Lipitor she felt her muscles were fatigued.  She feels much improved since discontinuing both of these. Patient denies chest pain, shortness of breath, dizziness or lower extremity edema.  She reports she has been getting out walking with her son since her husband passed away.  She will get short of breath on hills but overall feels she is feeling okay cardiac wise.  Pt takes daily baby ASA. . Diet: Low-sodium Exercise: Routine exercise RF: Pulmonary hypertension, family history heart disease, hyperlipidemia   Depression: Patient reports compliance with Celexa 40 mg daily.  She feels overall she is coping okay after the death of her husband last year.  She states she started to get back out to church weekly and making lunch dates with friends.  Her son is staying with her and is taking good care of her.  Her and her son are walking a few times a week.  She was still have moments where she becomes very tearful.  Prior  note: Many years ago when she was diagnosed with breast cancer. She had discontinued until her son had become incarcerated.  SHe has been on medication since.   She feels the medication has worked well for her at Celexa 40 mg daily.  Her son is now home and living with her and her husband.  Seems to be working out well for them and he is helping around the home.  Her husband unfortunately is in the hospital and has not been doing very well.  They are having a very difficult time keeping the fluid off of him despite high-dose diuretics.  Depression screen [facility] 2/9 06/15/2020 02/07/2019 07/21/2018 11/19/2017 03/03/2017  Decreased Interest 1 0 0 0 0  Down, Depressed, Hopeless 0 0 0 0 1  PHQ - 2 Score 1 0 0 0 1  Altered sleeping 1 - 0 0 0  Tired, decreased energy 1 - 1 1 1   Change in appetite 0 - 0 0 1  Feeling bad or failure about yourself  0 - 0 0 0  Trouble concentrating 0 - 0 0 0  Moving slowly or fidgety/restless 0 - 0 0 0  Suicidal thoughts 0 - 0 0 0  PHQ-9 Score 3 - 1 1 3   Difficult doing work/chores - - Not difficult at all Not difficult at all -   GAD 7 : Generalized Anxiety Score 06/15/2020 07/21/2018 03/03/2017  Nervous, Anxious, on Edge 2 0 0  Control/stop worrying 2 0 0  Worry too much -  different things 2 1 0  Trouble relaxing 2 0 0  Restless 0 0 0  Easily annoyed or irritable 0 0 0  Afraid - awful might happen - 0 0  Total GAD 7 Score - 1 0  Anxiety Difficulty - Not difficult at all -   Immunization History  Administered Date(s) Administered   DTaP 04/13/2015   Influenza, High Dose Seasonal PF 11/27/2016, 11/19/2017, 12/26/2018, 12/26/2018, 11/26/2020   Influenza,inj,Quad PF,6+ Mos 11/28/2018   PFIZER(Purple Top)SARS-COV-2 Vaccination 02/08/2019, 03/07/2019, 04/01/2019, 11/24/2019   Pfizer Covid-19 Vaccine Bivalent Booster 26yrs & up 11/26/2020   Pneumococcal Conjugate-13 02/07/2019   Pneumococcal Polysaccharide-23 11/19/2017   Past Medical History:  Diagnosis Date   Anxiety  05/26/2013   Arthritis    Breast cancer, left breast () 2006   Lumpectomy, radiation and tamoxifen for 5 years.   Chronic systolic heart failure ([place]) 05/14/2016   Colon polyp    Cyst of pancreas    Depression 05/26/2013   Migraines    Moderate tricuspid regurgitation by prior echocardiogram 05/13/2010   Osteopenia 12/04/2017   Pneumonia due to COVID-19 virus    Pulmonary hypertension ([place]) 05/13/2010   Pulmonary stenosis 05/13/2010   Study Conclusions  - Left ventricle: The cavity size was normal. Systolic   function was mildly reduced. The estimated ejection   fraction was in the range of 45% to 50%. There is   hypokinesis of the anteroseptal myocardium. Doppler   parameters are consistent with abnormal left ventricular   relaxation (grade 1 diastolic dysfunction). - Right ventricle: The cavity size was mildly dilated. Wall   thickness was normal. Systolic function was mildly   reduced. - Right atrium: The atrium was mildly dilated. - Atrial septum: No atrial level shunt, at baseline with   Doppler analysis. Prior ASD repair, CardioSeal 2004. - Pulmonic valve: Transvalvular velocity was increased, due   to mild degree of stenosis. Peak velocity: 224cm/s (S). - Pulmonary arteries: Systolic pressure was mildly   increased.     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 4 children.     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   Takes a daily vitamin.   Smoke alarm in the home.   Wears her seatbelt.   Feels safe in her relationships.    Allergies as of 02/25/2021   No Known Allergies      Medication List        Accurate as of February 25, 2021  2:22 PM. If you have any questions, ask your nurse or doctor.          aspirin 81 MG tablet Take 81 mg by mouth daily.   atorvastatin 40 MG tablet Commonly known as: LIPITOR Take 1 tablet (40 mg total) by mouth daily.   citalopram  40 MG tablet Commonly known as: CELEXA TAKE 1 TABLET BY MOUTH EVERY DAY   LORazepam 0.5 MG tablet Commonly known as: ATIVAN Take 0.5-1 tablets (0.25-0.5 mg total) by mouth 2 (two) times daily as needed for anxiety.   losartan 25 MG tablet Commonly known as: COZAAR TAKE 1 TABLET BY MOUTH EVERY DAY   metoprolol succinate 25 MG 24 hr tablet Commonly known as: TOPROL-XL TAKE 1/2 TABLET BY MOUTH DAILY. PLEASE MAKE APPT FOR FUTURE REFILLS THANK YOU   multivitamin capsule Take 1 capsule by mouth daily.   Restasis 0.05 % ophthalmic emulsion Generic drug: cycloSPORINE INSTILL 1 DROP INTO BOTH EYES TWICE A DAY   STOOL SOFTENER PO Take 1 capsule by mouth as needed.   Vitamin D 1000 units capsule Take 1,000 Units by mouth daily.        All past medical history, surgical history, allergies, family history, immunizations andmedications were updated in the EMR today and reviewed under the history and medication portions of their EMR.     No results found for this or any previous visit (from the past 2160 hour(s)).  ROS: 14 pt review of systems performed and negative (unless mentioned in an HPI)  Objective: BP 117/66    Pulse 71    Temp 98.3 F (36.8 C)    Wt 159 lb 6.4 oz (72.3 kg)    SpO2 97%    BMI 27.36 kg/m  Physical Exam Vitals and nursing note reviewed.  Constitutional:      General: She is not in acute distress.    Appearance: Normal appearance. She is not ill-appearing,  toxic-appearing or diaphoretic.  HENT:     Head: Normocephalic and atraumatic.  Eyes:     General: No scleral icterus.       Right eye: No discharge.        Left eye: No discharge.     Extraocular Movements: Extraocular movements intact.     Conjunctiva/sclera: Conjunctivae normal.     Pupils: Pupils are equal, round, and reactive to light.  Cardiovascular:     Rate and Rhythm: Normal rate and regular rhythm.     Heart sounds: Murmur heard.     Comments: Chronic 1/6 SM Pulmonary:     Effort: Pulmonary effort is normal. No respiratory distress.     Breath sounds: Normal breath sounds. No wheezing, rhonchi or rales.  Musculoskeletal:     Cervical back: Neck supple. No tenderness.  Lymphadenopathy:     Cervical: No cervical adenopathy.  Skin:    General: Skin is warm and dry.     Coloration: Skin is not jaundiced or pale.     Findings: No erythema or rash.  Neurological:     Mental Status: She is alert and oriented to person, place, and time. Mental status is at baseline.     Motor: No weakness.     Gait: Gait normal.  Psychiatric:        Mood and Affect: Mood normal.        Behavior: Behavior normal.        Thought Content: Thought content normal.        Judgment: Judgment normal.     Comments: Tearful. Grieving.       No results found.  Assessment/plan: Grace Fletcher is a 81 y.o. female present for [facility] Depression, unspecified depression type/Anxiety Stable.  Discussed "normal" grieving process. She is making improvements with time.  Continue  Celexa 40 mg daily. Continue Ativan 0.5 mg twice  daily as needed (she only filled one script last year-rarely needs).  [place] controlled substance database reviewed today. Follow-up 5.5 months for chronic medical conditions. Labs due at that time.   Chronic systolic heart failure (HCC)/hyperlipidemia/statin therapy/overweight/pulmonary hypertension Stable.  Managed by cardiology.  -Continue Cozaar>> meds managed by  cardiology. -Continue baby aspirin. -dc'd her own Lipitor -dc'd on her metoprolol> advised her if able to tolerate even low dose would be beneficial for heart.    Return in about 24 weeks (around 08/12/2021) for [facility] (30 min).  No orders of the defined types were placed in this encounter.  Meds ordered this encounter  Medications   citalopram (CELEXA) 40 MG tablet    Sig: TAKE 1 TABLET BY MOUTH EVERY DAY    Dispense:  90 tablet    Refill:  1   LORazepam (ATIVAN) 0.5 MG tablet    Sig: Take 0.5-1 tablets (0.25-0.5 mg total) by mouth 2 (two) times daily as needed for anxiety.    Dispense:  60 tablet    Refill:  5   Referral Orders  No referral(s) requested today     Electronically signed by: Howard Pouch, [place]

## 2021-02-27 ENCOUNTER — Telehealth: Payer: Self-pay

## 2021-02-27 NOTE — Telephone Encounter (Signed)
Received Mammogram orders from [facility] on 02/27/21. Will place on PCP desk for review.

## 2021-02-27 NOTE — Telephone Encounter (Signed)
Completed and placed in [facility] work basket 

## 2021-02-27 NOTE — Telephone Encounter (Signed)
Order faxed back to [facility]

## 2021-03-07 LAB — HM MAMMOGRAPHY

## 2021-03-27 ENCOUNTER — Ambulatory Visit (INDEPENDENT_AMBULATORY_CARE_PROVIDER_SITE_OTHER): Payer: Medicare Other

## 2021-03-27 ENCOUNTER — Other Ambulatory Visit: Payer: Self-pay

## 2021-03-27 DIAGNOSIS — Z0000 Encounter for general adult medical examination without abnormal findings: Secondary | ICD-10-CM | POA: Diagnosis not present

## 2021-03-27 NOTE — Patient Instructions (Signed)
Grace Fletcher , Thank you for taking time to come for your Medicare Wellness Visit. I appreciate your ongoing commitment to your health goals. Please review the following plan we discussed and let me know if I can assist you in the future.   Screening recommendations/referrals: Colonoscopy: No longer required  Mammogram: Don 03/07/21 repeat every year  Bone Density: Done 11/26/17 repeat every 2 years  Recommended yearly ophthalmology/optometry visit for glaucoma screening and checkup Recommended yearly dental visit for hygiene and checkup  Vaccinations: Influenza vaccine: Done 11/26/20 repeat every year  Pneumococcal vaccine: Up to date Tdap vaccine: Done 03/28/15 repeat every year  Shingles vaccine: Shingrix discussed. Please contact your pharmacy for coverage information.    Covid-19:Completed 1/12, 2/8, 3/5, 1028/21 & 11/26/20  Advanced directives: Please bring a copy of your health care power of attorney and living will to the office at your convenience.  Conditions/risks identified: lose a little weight   Next appointment: Follow up in one year for your annual wellness visit    Preventive Care 65 Years and Older, Female Preventive care refers to lifestyle choices and visits with your health care provider that can promote health and wellness. What does preventive care include? A yearly physical exam. This is also called an annual well check. Dental exams once or twice a year. Routine eye exams. Ask your health care provider how often you should have your eyes checked. Personal lifestyle choices, including: Daily care of your teeth and gums. Regular physical activity. Eating a healthy diet. Avoiding tobacco and drug use. Limiting alcohol use. Practicing safe sex. Taking low-dose aspirin every day. Taking vitamin and mineral supplements as recommended by your health care provider. What happens during an annual well check? The services and screenings done by your health care  provider during your annual well check will depend on your age, overall health, lifestyle risk factors, and family history of disease. Counseling  Your health care provider may ask you questions about your: Alcohol use. Tobacco use. Drug use. Emotional well-being. Home and relationship well-being. Sexual activity. Eating habits. History of falls. Memory and ability to understand (cognition). Work and work Statistician. Reproductive health. Screening  You may have the following tests or measurements: Height, weight, and BMI. Blood pressure. Lipid and cholesterol levels. These may be checked every 5 years, or more frequently if you are over 47 years old. Skin check. Lung cancer screening. You may have this screening every year starting at age 34 if you have a 30-pack-year history of smoking and currently smoke or have quit within the past 15 years. Fecal occult blood test (FOBT) of the stool. You may have this test every year starting at age 70. Flexible sigmoidoscopy or colonoscopy. You may have a sigmoidoscopy every 5 years or a colonoscopy every 10 years starting at age 6. Hepatitis C blood test. Hepatitis B blood test. Sexually transmitted disease (STD) testing. Diabetes screening. This is done by checking your blood sugar (glucose) after you have not eaten for a while (fasting). You may have this done every 1-3 years. Bone density scan. This is done to screen for osteoporosis. You may have this done starting at age 54. Mammogram. This may be done every 1-2 years. Talk to your health care provider about how often you should have regular mammograms. Talk with your health care provider about your test results, treatment options, and if necessary, the need for more tests. Vaccines  Your health care provider may recommend certain vaccines, such as: Influenza vaccine. This is  recommended every year. Tetanus, diphtheria, and acellular pertussis (Tdap, Td) vaccine. You may need a Td  booster every 10 years. Zoster vaccine. You may need this after age 59. Pneumococcal 13-valent conjugate (PCV13) vaccine. One dose is recommended after age 1. Pneumococcal polysaccharide (PPSV23) vaccine. One dose is recommended after age 68. Talk to your health care provider about which screenings and vaccines you need and how often you need them. This information is not intended to replace advice given to you by your health care provider. Make sure you discuss any questions you have with your health care provider. Document Released: 02/09/2015 Document Revised: 10/03/2015 Document Reviewed: 11/14/2014 Elsevier Interactive Patient Education  2017 [place] Prevention in the Home Falls can cause injuries. They can happen to people of all ages. There are many things you can do to make your home safe and to help prevent falls. What can I do on the outside of my home? Regularly fix the edges of walkways and driveways and fix any cracks. Remove anything that might make you trip as you walk through a door, such as a raised step or threshold. Trim any bushes or trees on the path to your home. Use bright outdoor lighting. Clear any walking paths of anything that might make someone trip, such as rocks or tools. Regularly check to see if handrails are loose or broken. Make sure that both sides of any steps have handrails. Any raised decks and porches should have guardrails on the edges. Have any leaves, snow, or ice cleared regularly. Use sand or salt on walking paths during winter. Clean up any spills in your garage right away. This includes oil or grease spills. What can I do in the bathroom? Use night lights. Install grab bars by the toilet and in the tub and shower. Do not use towel bars as grab bars. Use non-skid mats or decals in the tub or shower. If you need to sit down in the shower, use a plastic, non-slip stool. Keep the floor dry. Clean up any water that spills on the floor  as soon as it happens. Remove soap buildup in the tub or shower regularly. Attach bath mats securely with double-sided non-slip rug tape. Do not have throw rugs and other things on the floor that can make you trip. What can I do in the bedroom? Use night lights. Make sure that you have a light by your bed that is easy to reach. Do not use any sheets or blankets that are too big for your bed. They should not hang down onto the floor. Have a firm chair that has side arms. You can use this for support while you get dressed. Do not have throw rugs and other things on the floor that can make you trip. What can I do in the kitchen? Clean up any spills right away. Avoid walking on wet floors. Keep items that you use a lot in easy-to-reach places. If you need to reach something above you, use a strong step stool that has a grab bar. Keep electrical cords out of the way. Do not use floor polish or wax that makes floors slippery. If you must use wax, use non-skid floor wax. Do not have throw rugs and other things on the floor that can make you trip. What can I do with my stairs? Do not leave any items on the stairs. Make sure that there are handrails on both sides of the stairs and use them. Fix handrails that are  broken or loose. Make sure that handrails are as long as the stairways. Check any carpeting to make sure that it is firmly attached to the stairs. Fix any carpet that is loose or worn. Avoid having throw rugs at the top or bottom of the stairs. If you do have throw rugs, attach them to the floor with carpet tape. Make sure that you have a light switch at the top of the stairs and the bottom of the stairs. If you do not have them, ask someone to add them for you. What else can I do to help prevent falls? Wear shoes that: Do not have high heels. Have rubber bottoms. Are comfortable and fit you well. Are closed at the toe. Do not wear sandals. If you use a stepladder: Make sure that it is  fully opened. Do not climb a closed stepladder. Make sure that both sides of the stepladder are locked into place. Ask someone to hold it for you, if possible. Clearly mark and make sure that you can see: Any grab bars or handrails. First and last steps. Where the edge of each step is. Use tools that help you move around (mobility aids) if they are needed. These include: Canes. Walkers. Scooters. Crutches. Turn on the lights when you go into a dark area. Replace any light bulbs as soon as they burn out. Set up your furniture so you have a clear path. Avoid moving your furniture around. If any of your floors are uneven, fix them. If there are any pets around you, be aware of where they are. Review your medicines with your doctor. Some medicines can make you feel dizzy. This can increase your chance of falling. Ask your doctor what other things that you can do to help prevent falls. This information is not intended to replace advice given to you by your health care provider. Make sure you discuss any questions you have with your health care provider. Document Released: 11/09/2008 Document Revised: 06/21/2015 Document Reviewed: 02/17/2014 Elsevier Interactive Patient Education  2017 [employer].

## 2021-03-27 NOTE — Progress Notes (Addendum)
Virtual Visit via Telephone Note  I connected with  Grace Fletcher on 03/27/21 at  1:00 PM EST by telephone and verified that I am speaking with the correct person using two identifiers.  Medicare Annual Wellness visit completed telephonically due to Covid-19 pandemic.   Persons participating in this call: This Health Coach and this patient.   Location: Patient: Home Provider: Office   I discussed the limitations, risks, security and privacy concerns of performing an evaluation and management service by telephone and the availability of in person appointments. The patient expressed understanding and agreed to proceed.  Unable to perform video visit due to video visit attempted and failed and/or patient does not have video capability.   Some vital signs may be absent or patient reported.   Willette Brace, LPN   Subjective:   Grace Fletcher is a 81 y.o. female who presents for an Initial Medicare Annual Wellness Visit.  Review of Systems     Cardiac Risk Factors include: advanced age (>92men, >65 women);dyslipidemia;hypertension     Objective:    There were no vitals filed for this visit. There is no height or weight on file to calculate BMI.  Advanced Directives 03/27/2021 05/24/2015 05/23/2014  Does Patient Have a Medical Advance Directive? Yes No No  Type of Advance Directive [place] in Chart? No - copy requested - -    Current Medications (verified) Outpatient Encounter Medications as of 03/27/2021  Medication Sig   aspirin 81 MG tablet Take 81 mg by mouth daily.   Cholecalciferol (VITAMIN D) 1000 UNITS capsule Take 1,000 Units by mouth daily.   citalopram (CELEXA) 40 MG tablet TAKE 1 TABLET BY MOUTH EVERY DAY   Docusate Calcium (STOOL SOFTENER PO) Take 1 capsule by mouth as needed.    LORazepam (ATIVAN) 0.5 MG tablet Take 0.5-1 tablets (0.25-0.5 mg total) by mouth 2 (two) times daily as needed for  anxiety.   losartan (COZAAR) 25 MG tablet TAKE 1 TABLET BY MOUTH EVERY DAY   Multiple Vitamin (MULTIVITAMIN) capsule Take 1 capsule by mouth daily.   RESTASIS 0.05 % ophthalmic emulsion INSTILL 1 DROP INTO BOTH EYES TWICE A DAY   [DISCONTINUED] atorvastatin (LIPITOR) 40 MG tablet Take 1 tablet (40 mg total) by mouth daily. (Patient not taking: Reported on 02/25/2021)   [DISCONTINUED] metoprolol succinate (TOPROL-XL) 25 MG 24 hr tablet TAKE 1/2 TABLET BY MOUTH DAILY. PLEASE MAKE APPT FOR FUTURE REFILLS THANK YOU (Patient not taking: Reported on 02/25/2021)   No facility-administered encounter medications on file as of 03/27/2021.    Allergies (verified) Patient has no known allergies.   History: Past Medical History:  Diagnosis Date   Anxiety 05/26/2013   Arthritis    Breast cancer, left breast ([place]) 2006   Lumpectomy, radiation and tamoxifen for 5 years.   Chronic systolic heart failure ([place]) 05/14/2016   Colon polyp    Cyst of pancreas    Depression 05/26/2013   Migraines    Moderate tricuspid regurgitation by prior echocardiogram 05/13/2010   Osteopenia 12/04/2017   Pneumonia due to COVID-19 virus    Pulmonary hypertension ([place]) 05/13/2010   Pulmonary stenosis 05/13/2010   Study Conclusions  - Left ventricle: The cavity size was normal. Systolic   function was mildly reduced. The estimated ejection   fraction was in the range of 45% to 50%. There is   hypokinesis of the anteroseptal myocardium. Doppler   parameters are consistent  with abnormal left ventricular   relaxation (grade 1 diastolic dysfunction). - Right ventricle: The cavity size was mildly dilated. Wall   thickness was normal. Systolic function was mildly   reduced. - Right atrium: The atrium was mildly dilated. - Atrial septum: No atrial level shunt, at baseline with   Doppler analysis. Prior ASD repair, CardioSeal 2004. - Pulmonic valve: Transvalvular velocity was increased, due   to mild degree of stenosis. Peak velocity: 224cm/s  (S). - Pulmonary arteries: Systolic pressure was mildly   increased.     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   Card[facility]AL device, 2004; [facility]    Past Surgical History:  Procedure Laterality Date   BREAST LUMPECTOMY     left breast cancer, status post lumpectomy, radiation and tamoxifen 5 years   CARPAL TUNNEL RELEASE     CESAREAN SECTION     x4   SENTINEL LYMPH NODE BIOPSY     Family History  Problem Relation Age of Onset   Dementia Mother    Arthritis Mother    Heart attack Father    Heart disease Father    Hypertension Sister    Heart attack Paternal Grandmother    Heart disease Paternal Grandmother    Social History   Socioeconomic History   Marital status: Married    Spouse name: Not on file   Number of children: 4   Years of education: Not on file   Highest education level: Not on file  Occupational History   Occupation: retired  Tobacco Use   Smoking status: Former   Smokeless tobacco: Never  Scientific laboratory technician Use: Never used  Substance and Sexual Activity   Alcohol use: No   Drug use: No   Sexual activity: Yes    Partners: Male    Birth control/protection: Post-menopausal  Other Topics Concern   Not on file  Social History Narrative   Married. 4 children.    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   Takes a daily vitamin.   Smoke alarm in the home.   Wears her seatbelt.   Feels safe in her relationships.   Social Determinants of Health   Financial Resource Strain: Low Risk    Difficulty of Paying Living Expenses: Not hard at all  Food Insecurity: No Food Insecurity   Worried About Charity fundraiser in the Last Year: Never true   [place] in the Last Year: Never true  Transportation Needs: No Transportation Needs   Lack of Transportation (Medical): No   Lack of Transportation (Non-Medical): No  Physical Activity: Insufficiently Active   Days of Exercise per Week: 3 days   Minutes of Exercise per  Session: 20 min  Stress: Stress Concern Present   Feeling of Stress : To some extent  Social Connections: Moderately Integrated   Frequency of Communication with Friends and Family: Three times a week   Frequency of Social Gatherings with Friends and Family: More than three times a week   Attends Religious Services: More than 4 times per year   Active Member of Clubs or Organizations: Yes   Attends Archivist Meetings: 1 to 4 times per year   Marital Status: Widowed    Tobacco Counseling Counseling given: Not Answered   Clinical Intake:  Pre-visit preparation completed: Yes  Pain : No/denies pain     BMI - recorded: 27.36 Nutritional Status: BMI 25 -29 Overweight Nutritional Risks: None Diabetes: No  How often do you  need to have someone help you when you read instructions, pamphlets, or other written materials from your doctor or pharmacy?: 1 - Never  Diabetic?no  Interpreter Needed?: No  Information entered by :: Charlott Rakes, LPN   Activities of Daily Living In your present state of health, do you have any difficulty performing the following activities: 03/27/2021  Hearing? N  Vision? N  Difficulty concentrating or making decisions? N  Walking or climbing stairs? N  Dressing or bathing? N  Doing errands, shopping? N  Preparing Food and eating ? N  Using the Toilet? N  In the past six months, have you accidently leaked urine? N  Do you have problems with loss of bowel control? N  Managing your Medications? N  Managing your Finances? N  Housekeeping or managing your Housekeeping? N  Some recent data might be hidden    Patient Care Team: Ma Hillock, DO as PCP - General (Family Medicine) Belva Crome, MD as PCP - Cardiology (Cardiology) Magrinat, Virgie Dad, MD (Inactive) as Consulting Physician (Oncology) Renelda Loma, OD (Optometry) Dian Queen, MD as Consulting Physician (Obstetrics and Gynecology)  Indicate any recent Medical  Services you may have received from other than [facility] providers in the past year (date may be approximate).     Assessment:   This is a routine wellness examination for [place].  Hearing/Vision screen Hearing Screening - Comments:: Pt denies any hearing  Vision Screening - Comments:: Pt follows up with Dr Herbert Deaner for annual eye exams   Dietary issues and exercise activities discussed: Current Exercise Habits: Home exercise routine, Type of exercise: walking, Time (Minutes): 20, Frequency (Times/Week): 3, Weekly Exercise (Minutes/Week): 60   Goals Addressed             This Visit's Progress    lose  a little weight         Depression Screen PHQ 2/9 Scores 03/27/2021 06/15/2020 02/07/2019 07/21/2018 11/19/2017 03/03/2017  PHQ - 2 Score 1 1 0 0 0 1  PHQ- 9 Score - 3 - 1 1 3     Fall Risk Fall Risk  03/27/2021 06/15/2020 02/07/2019 11/19/2017 03/03/2017  Falls in the past year? 0 0 0 No No  Number falls in past yr: 0 0 - - -  Injury with Fall? 0 0 - - -  Risk for fall due to : Impaired vision - - - -  Follow up Falls prevention discussed Falls evaluation completed - - -    FALL RISK PREVENTION PERTAINING TO THE HOME:  Any stairs in or around the home? Yes  If so, are there any without handrails? No  Home free of loose throw rugs in walkways, pet beds, electrical cords, etc? Yes  Adequate lighting in your home to reduce risk of falls? Yes   ASSISTIVE DEVICES UTILIZED TO PREVENT FALLS:  Life alert? No  Use of a cane, walker or w/c? No  Grab bars in the bathroom? Yes  Shower chair or bench in shower? No  Elevated toilet seat or a handicapped toilet? No   TIMED UP AND GO:  Was the test performed? No .   Cognitive Function:     6CIT Screen 03/27/2021  What Year? 0 points  What month? 0 points  What time? 0 points  Count back from 20 0 points  Months in reverse 0 points  Repeat phrase 0 points  Total Score 0    Immunizations Immunization History  Administered Date(s)  Administered   DTaP 04/13/2015  Influenza, High Dose Seasonal PF 11/27/2016, 11/19/2017, 12/26/2018, 12/26/2018, 11/26/2020   Influenza,inj,Quad PF,6+ Mos 11/28/2018   PFIZER(Purple Top)SARS-COV-2 Vaccination 02/08/2019, 03/07/2019, 04/01/2019, 11/24/2019   Pfizer Covid-19 Vaccine Bivalent Booster 48yrs & up 11/26/2020   Pneumococcal Conjugate-13 02/07/2019   Pneumococcal Polysaccharide-23 11/19/2017    TDAP status: Up to date  Flu Vaccine status: Up to date  Pneumococcal vaccine status: Up to date  Covid-19 vaccine status: Completed vaccines  Qualifies for Shingles Vaccine? Yes   Zostavax completed No   Shingrix Completed?: No.    Education has been provided regarding the importance of this vaccine. Patient has been advised to call insurance company to determine out of pocket expense if they have not yet received this vaccine. Advised may also receive vaccine at local pharmacy or Health Dept. Verbalized acceptance and understanding.  Screening Tests Health Maintenance  Topic Date Due   Hepatitis C Screening  Never done   Zoster Vaccines- Shingrix (1 of 2) Never done   DEXA SCAN  03/28/2022 (Originally 11/27/2019)   MAMMOGRAM  03/07/2022   TETANUS/TDAP  03/27/2025   Pneumonia Vaccine 100+ Years old  Completed   INFLUENZA VACCINE  Completed   COVID-19 Vaccine  Completed   HPV VACCINES  Aged Out    Health Maintenance  Health Maintenance Due  Topic Date Due   Hepatitis C Screening  Never done   Zoster Vaccines- Shingrix (1 of 2) Never done    Colorectal cancer screening: No longer required.   Mammogram status: Completed 03/07/21. Repeat every year  Bone Density status: Completed 11/26/17. Results reflect: Bone density results: OSTEOPENIA. Repeat every 2 years.ants to schedule when have next mammogram   Additional Screening:  Hepatitis C Screening: does qualify;  Vision Screening: Recommended annual ophthalmology exams for early detection of glaucoma and other  disorders of the eye. Is the patient up to date with their annual eye exam?  Yes  Who is the provider or what is the name of the office in which the patient attends annual eye exams? Dr Herbert Deaner  If pt is not established with a provider, would they like to be referred to a provider to establish care? No .   Dental Screening: Recommended annual dental exams for proper oral hygiene  Community Resource Referral / Chronic Care Management: CRR required this visit?  No   CCM required this visit?  No      Plan:     I have personally reviewed and noted the following in the patients chart:   Medical and social history Use of alcohol, tobacco or illicit drugs  Current medications and supplements including opioid prescriptions. Patient is not currently taking opioid prescriptions. Functional ability and status Nutritional status Physical activity Advanced directives List of other physicians Hospitalizations, surgeries, and ER visits in previous 12 months Vitals Screenings to include cognitive, depression, and falls Referrals and appointments  In addition, I have reviewed and discussed with patient certain preventive protocols, quality metrics, and best practice recommendations. A written personalized care plan for preventive services as well as general preventive health recommendations were provided to patient.     Willette Brace, LPN   02/02/3844   Nurse Notes: None

## 2021-05-11 ENCOUNTER — Encounter (HOSPITAL_BASED_OUTPATIENT_CLINIC_OR_DEPARTMENT_OTHER): Payer: Self-pay

## 2021-05-11 ENCOUNTER — Emergency Department (HOSPITAL_BASED_OUTPATIENT_CLINIC_OR_DEPARTMENT_OTHER): Payer: Medicare Other

## 2021-05-11 ENCOUNTER — Emergency Department (HOSPITAL_BASED_OUTPATIENT_CLINIC_OR_DEPARTMENT_OTHER)
Admission: EM | Admit: 2021-05-11 | Discharge: 2021-05-11 | Disposition: A | Discharge status: Home / Self Care | Payer: Medicare Other | Attending: Emergency Medicine | Admitting: Emergency Medicine

## 2021-05-11 ENCOUNTER — Other Ambulatory Visit: Payer: Self-pay

## 2021-05-11 DIAGNOSIS — I110 Hypertensive heart disease with heart failure: Secondary | ICD-10-CM | POA: Insufficient documentation

## 2021-05-11 DIAGNOSIS — R1110 Vomiting, unspecified: Secondary | ICD-10-CM | POA: Diagnosis present

## 2021-05-11 DIAGNOSIS — I509 Heart failure, unspecified: Secondary | ICD-10-CM | POA: Insufficient documentation

## 2021-05-11 DIAGNOSIS — K529 Noninfective gastroenteritis and colitis, unspecified: Secondary | ICD-10-CM | POA: Diagnosis not present

## 2021-05-11 DIAGNOSIS — Z7982 Long term (current) use of aspirin: Secondary | ICD-10-CM | POA: Insufficient documentation

## 2021-05-11 DIAGNOSIS — Z79899 Other long term (current) drug therapy: Secondary | ICD-10-CM | POA: Diagnosis not present

## 2021-05-11 LAB — CBC WITH DIFFERENTIAL/PLATELET
Abs Immature Granulocytes: 0.04 10*3/uL (ref 0.00–0.07)
Basophils Absolute: 0 10*3/uL (ref 0.0–0.1)
Basophils Relative: 0 %
Eosinophils Absolute: 0.1 10*3/uL (ref 0.0–0.5)
Eosinophils Relative: 1 %
HCT: 42.8 % (ref 36.0–46.0)
Hemoglobin: 14.5 g/dL (ref 12.0–15.0)
Immature Granulocytes: 1 %
Lymphocytes Relative: 33 %
Lymphs Abs: 1.7 10*3/uL (ref 0.7–4.0)
MCH: 31.9 pg (ref 26.0–34.0)
MCHC: 33.9 g/dL (ref 30.0–36.0)
MCV: 94.1 fL (ref 80.0–100.0)
Monocytes Absolute: 0.5 10*3/uL (ref 0.1–1.0)
Monocytes Relative: 9 %
Neutro Abs: 2.8 10*3/uL (ref 1.7–7.7)
Neutrophils Relative %: 56 %
Platelets: 191 10*3/uL (ref 150–400)
RBC: 4.55 MIL/uL (ref 3.87–5.11)
RDW: 13.1 % (ref 11.5–15.5)
WBC: 5.1 10*3/uL (ref 4.0–10.5)
nRBC: 0 % (ref 0.0–0.2)

## 2021-05-11 LAB — URINALYSIS, ROUTINE W REFLEX MICROSCOPIC
Bilirubin Urine: NEGATIVE
Glucose, UA: NEGATIVE mg/dL
Hgb urine dipstick: NEGATIVE
Ketones, ur: NEGATIVE mg/dL
Leukocytes,Ua: NEGATIVE
Nitrite: NEGATIVE
Protein, ur: NEGATIVE mg/dL
Specific Gravity, Urine: 1.01 (ref 1.005–1.030)
pH: 7 (ref 5.0–8.0)

## 2021-05-11 LAB — COMPREHENSIVE METABOLIC PANEL
ALT: 21 U/L (ref 0–44)
AST: 26 U/L (ref 15–41)
Albumin: 3.7 g/dL (ref 3.5–5.0)
Alkaline Phosphatase: 60 U/L (ref 38–126)
Anion gap: 7 (ref 5–15)
BUN: 6 mg/dL — ABNORMAL LOW (ref 8–23)
CO2: 23 mmol/L (ref 22–32)
Calcium: 8.8 mg/dL — ABNORMAL LOW (ref 8.9–10.3)
Chloride: 106 mmol/L (ref 98–111)
Creatinine, Ser: 0.59 mg/dL (ref 0.44–1.00)
GFR, Estimated: >60 mL/min (ref >60)
Glucose, Bld: 105 mg/dL — ABNORMAL HIGH (ref 70–99)
Potassium: 3.3 mmol/L — ABNORMAL LOW (ref 3.5–5.1)
Sodium: 136 mmol/L (ref 135–145)
Total Bilirubin: 0.7 mg/dL (ref 0.3–1.2)
Total Protein: 6.4 g/dL — ABNORMAL LOW (ref 6.5–8.1)

## 2021-05-11 LAB — LIPASE, BLOOD: Lipase: 31 U/L (ref 11–51)

## 2021-05-11 MED ORDER — LIDOCAINE VISCOUS HCL 2 % MT SOLN
15.0000 mL | Freq: Once | OROMUCOSAL | Status: AC
  Administered 2021-05-11: 15 mL via ORAL
  Filled 2021-05-11: qty 15

## 2021-05-11 MED ORDER — PANTOPRAZOLE SODIUM 40 MG PO TBEC
40.0000 mg | DELAYED_RELEASE_TABLET | Freq: Once | ORAL | Status: AC
  Administered 2021-05-11: 40 mg via ORAL
  Filled 2021-05-11: qty 1

## 2021-05-11 MED ORDER — ALUM & MAG HYDROXIDE-SIMETH 200-200-20 MG/5ML PO SUSP
30.0000 mL | Freq: Once | ORAL | Status: AC
  Administered 2021-05-11: 30 mL via ORAL
  Filled 2021-05-11: qty 30

## 2021-05-11 MED ORDER — IOHEXOL 300 MG/ML  SOLN
100.0000 mL | Freq: Once | INTRAMUSCULAR | Status: AC | PRN
  Administered 2021-05-11: 100 mL via INTRAVENOUS

## 2021-05-11 MED ORDER — MAALOX MAX 400-400-40 MG/5ML PO SUSP
15.0000 mL | Freq: Four times a day (QID) | ORAL | 0 refills | Status: DC | PRN
Start: 2021-05-11 — End: 2021-10-11

## 2021-05-11 MED ORDER — HYOSCYAMINE SULFATE 0.125 MG SL SUBL
0.2500 mg | SUBLINGUAL_TABLET | Freq: Once | SUBLINGUAL | Status: AC
  Administered 2021-05-11: 0.25 mg via SUBLINGUAL
  Filled 2021-05-11: qty 2

## 2021-05-11 NOTE — ED Provider Notes (Signed)
?[facility] DEPARTMENT ?Provider Note ? ? ?CSN: [id number] ?Arrival date & time: 05/11/21  1230 ? ?  ? ?History ? ?Chief Complaint  ?Patient presents with  ? Abdominal Pain  ? ? ?Grace Fletcher is a 81 y.o. female with history of pulmonary hypertension, CHF and anxiety who presents to the ED for evaluation of abdominal pain and constipation for the last week.  Patient states that when family is in town for [place] weekend, everyone contracted a viral GI bug.  She had significant vomiting without diarrhea, however she has been constipated since then.  She has been taking over-the-counter stool softeners and drinking lots of prune juice without improvement.  She also endorses epigastric pain described as tender.  She also attempted digital disimpaction without success.  She denies chest pain, shortness of breath, fever, chills. ? ? ?Abdominal Pain ? ?  ? ?Home Medications ?Prior to Admission medications   ?Medication Sig Start Date End Date Taking? Authorizing Provider  ?alum & mag hydroxide-simeth (MAALOX MAX) 400-400-40 MG/5ML suspension Take 15 mLs by mouth every 6 (six) hours as needed for indigestion. 05/11/21  Yes Kathe Becton R, PA-C  ?aspirin 81 MG tablet Take 81 mg by mouth daily.    [provider]  ?Cholecalciferol (VITAMIN D) 1000 UNITS capsule Take 1,000 Units by mouth daily.    [provider]  ?citalopram (CELEXA) 40 MG tablet TAKE 1 TABLET BY MOUTH EVERY DAY 02/25/21   Kuneff, Renee A, DO  ?Docusate Calcium (STOOL SOFTENER PO) Take 1 capsule by mouth as needed.     [provider]  ?LORazepam (ATIVAN) 0.5 MG tablet Take 0.5-1 tablets (0.25-0.5 mg total) by mouth 2 (two) times daily as needed for anxiety. 02/25/21   Kuneff, Renee A, DO  ?losartan (COZAAR) 25 MG tablet TAKE 1 TABLET BY MOUTH EVERY DAY 09/06/20   Belva Crome, MD  ?Multiple Vitamin (MULTIVITAMIN) capsule Take 1 capsule by mouth daily.    [provider]  ?RESTASIS 0.05 % ophthalmic  emulsion INSTILL 1 DROP INTO BOTH EYES TWICE A DAY 03/31/18   [provider]  ?   ? ?Allergies    ?Patient has no known allergies.   ? ?Review of Systems   ?Review of Systems  ?Gastrointestinal:  Positive for abdominal pain.  ? ?Physical Exam ?Updated Vital Signs ?BP 134/72   Pulse 68   Temp 98 ?F (36.7 ?C) (Oral)   Resp 16   Ht '5\' 4"'$  (1.626 m)   Wt 65.8 kg   SpO2 99%   BMI 24.89 kg/m?  ?Physical Exam ?Vitals and nursing note reviewed.  ?Constitutional:   ?   General: She is not in acute distress. ?   Appearance: She is not ill-appearing.  ?HENT:  ?   Head: Atraumatic.  ?Eyes:  ?   Conjunctiva/sclera: Conjunctivae normal.  ?Cardiovascular:  ?   Rate and Rhythm: Normal rate and regular rhythm.  ?   Pulses: Normal pulses.  ?   Heart sounds: No murmur heard. ?Pulmonary:  ?   Effort: Pulmonary effort is normal. No respiratory distress.  ?   Breath sounds: Normal breath sounds.  ?Abdominal:  ?   General: Abdomen is flat. There is no distension.  ?   Palpations: Abdomen is soft.  ?   Tenderness: There is abdominal tenderness in the epigastric area. There is no right CVA tenderness or left CVA tenderness. Negative signs include Murphy's sign.  ?Musculoskeletal:     ?   General:  Normal range of motion.  ?   Cervical back: Normal range of motion.  ?Skin: ?   General: Skin is warm and dry.  ?   Capillary Refill: Capillary refill takes less than 2 seconds.  ?Neurological:  ?   General: No focal deficit present.  ?   Mental Status: She is alert.  ?Psychiatric:     ?   Mood and Affect: Mood normal.  ? ? ?ED Results / Procedures / Treatments   ?Labs ?(all labs ordered are listed, but only abnormal results are displayed) ?Labs Reviewed  ?COMPREHENSIVE METABOLIC PANEL - Abnormal; Notable for the following components:  ?    Result Value  ? Potassium 3.3 (*)   ? Glucose, Bld 105 (*)   ? BUN 6 (*)   ? Calcium 8.8 (*)   ? Total Protein 6.4 (*)   ? All other components within normal limits  ?CBC WITH DIFFERENTIAL/PLATELET   ?LIPASE, BLOOD  ?URINALYSIS, ROUTINE W REFLEX MICROSCOPIC  ? ? ?EKG ?None ? ?Radiology ?CT ABDOMEN PELVIS W CONTRAST ? ?Result Date: 05/11/2021 ?CLINICAL DATA:  Abdominal pain, vomiting EXAM: CT ABDOMEN AND PELVIS WITH CONTRAST TECHNIQUE: Multidetector CT imaging of the abdomen and pelvis was performed using the standard protocol following bolus administration of intravenous contrast. RADIATION DOSE REDUCTION: This exam was performed according to the departmental dose-optimization program which includes automated exposure control, adjustment of the mA and/or kV according to patient size and/or use of iterative reconstruction technique. CONTRAST:  126m OMNIPAQUE IOHEXOL 300 MG/ML  SOLN COMPARISON:  07/21/2011 FINDINGS: Lower chest: No acute pleural or parenchymal lung disease. ASD closure device identified. Bilateral mastectomies with stable breast prostheses. Hepatobiliary: No focal liver abnormality is seen. No gallstones, gallbladder wall thickening, or biliary dilatation. Pancreas: Unremarkable. No pancreatic ductal dilatation or surrounding inflammatory changes. Subcentimeter cyst within the pancreatic tail demonstrates long-term stability consistent with benign etiology. Spleen: Normal in size without focal abnormality. Adrenals/Urinary Tract: Adrenal glands are unremarkable. Kidneys are normal, without renal calculi, focal lesion, or hydronephrosis. Bladder is unremarkable. Stomach/Bowel: No evidence of high-grade bowel obstruction. Minimally distended fluid-filled loops of jejunum are seen within the left mid abdomen measuring up to 2.6 cm, with scattered gas fluid levels. Findings could reflect segmental ileus or underlying enteritis. Moderate retained stool within the ascending and transverse colon. No bowel wall thickening or inflammatory change. Vascular/Lymphatic: Aortic atherosclerosis. No enlarged abdominal or pelvic lymph nodes. Reproductive: Calcification within the ventral aspect of the uterine  fundus consistent with degenerating fibroid. Otherwise the uterus and adnexal structures are unremarkable. Other: No free fluid or free intraperitoneal gas. No abdominal wall hernia. Musculoskeletal: No acute or destructive bony lesions. 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 Severe L5-S1 spondylosis. Reconstructed images demonstrate no additional findings. IMPRESSION: 1. Fluid-filled mid jejunum with scattered gas fluid levels, favor enteritis over ileus. 2. Moderate fecal retention within the ascending and transverse colon. 3.  Aortic Atherosclerosis (ICD10-I70.0). Electronically Signed   By: MRanda NgoM.D.   On: 05/11/2021 17:37   ? ?Procedures ?Procedures  ? ?Medications Ordered in ED ?Medications  ?alum & mag hydroxide-simeth (MAALOX/MYLANTA) 200-200-20 MG/5ML suspension 30 mL (30 mLs Oral Given 05/11/21 1420)  ?  And  ?lidocaine (XYLOCAINE) 2 % viscous mouth solution 15 mL (15 mLs Oral Given 05/11/21 1420)  ?hyoscyamine (LEVSIN SL) SL tablet 0.25 mg (0.25 mg Sublingual Given 05/11/21 1419)  ?pantoprazole (PROTONIX) EC tablet 40 mg (40 mg Oral Given 05/11/21 1657)  ?iohexol (OMNIPAQUE)  300 MG/ML solution 100 mL (100 mLs Intravenous Contrast Given 05/11/21 1720)  ? ? ?ED Course/ Medical Decision Making/ A&P ?  ?                        ?Medical Decision Making ?Amount and/or Complexity of Data Reviewed ?Labs: ordered. ?Radiology: ordered. ? ?Risk ?OTC drugs. ?Prescription drug management. ? ? ?History:  ?Per HPI ?Social determinants of health: None ? ?Initial impression: ? ?This patient presents to the ED for concern of abdominal pain and constipation, this involves an extensive number of treatment options, and is a complaint that carries with it a high risk of complications and morbidity.    ? ? ?Lab Tests and EKG: ? ?I Ordered, reviewed, and interpreted labs and EKG.  The pertinent results include:  ?UA, lipase, CMP and CBC without acute findings ? ? ?Imaging Studies  ordered: ? ?I ordered imaging studies including  ?CT abdomen with inflammation of the jejunum, consistent with enteritis.  Moderate fecal buildup in colon ?I independently visualized and interpreted imaging and

## 2021-05-11 NOTE — ED Triage Notes (Signed)
Pt arrives ambulatory to ED with c/o abdominal pain. States that family was in town last weekend for the holiday states that everyone got a GI bug. She had vomiting but has been very constipated since. No history of bowel blockages but does report history of trouble with constipation. Pt has been taking 2 stool softeners 3x a day with prune juice and a lot of water. Attempted to disimpact herself and was unsuccessful.  ?

## 2021-05-11 NOTE — Discharge Instructions (Signed)
Your labs today overall very reassuring, your CT scan shows that you do have some inflammation of your small intestine, likely but viral origin.  Mainstay treatment for this is plenty of fluids and time.  Should resolve of its own over the next several days, however if it does not you can follow-up with your primary care doctor as needed.  I have sent you in a prescription for Maalox which may help with some of your discomfort. ? ?Additionally, for your constipation, you can take a bottle of MiraLAX in 40 to 64 ounces of Gatorade and sip on that throughout the day until you have a bowel movement.  This is very effective, so please ensure that you are at home while you are trying this and have access to toilet. ?

## 2021-05-20 NOTE — Progress Notes (Signed)
?Cardiology Office Note:   ? ?Date:  05/21/2021  ? ?ID:  Grace Fletcher, DOB 02/08/1940, MRN [id number] ? ?PCP:  Howard Pouch A, DO  ?Cardiologist:  Sinclair Grooms, MD  ? ?Referring MD: Ma Hillock, DO  ? ?Chief Complaint  ?Patient presents with  ? Follow-up  ?  Aortic atherosclerosis ?Hyperlipidemia ?Congenital pulmonary stenosis ?History of percutaneous ASD repair ?Chronic systolic heart failure  ? ? ?History of Present Illness:   ? ?Grace Fletcher is a 81 y.o. female with a hx of pulmonic stenosis, prior percutaneous ASD repair, mild dilation of the right ventricle, asymptomatic chronic systolic HF (EF 15% by echo 04/17/18 and 57% by nuclear wall motion study during [place]) and history of chest pain. ? ?Husband passed away late 2020-04-16 of CHF. ? ?She was tearful today as this is not the first visit after the death of her husband.  He was a patient and had stage IV congestive heart failure.  He died at home on hospice care.  She had concerns that she made a mistake by putting him on hospice.  She is also tormented by his conversations with her and believe that he would not pass away. ? ?She stopped taking all of her medications during her husband's illness.  She felt worse on the medications than off the medications.  She stopped taking statins, metoprolol, and losartan. ? ?Past Medical History:  ?Diagnosis Date  ? Anxiety 05/26/2013  ? Arthritis   ? Breast cancer, left breast ([place]) 2004-04-16  ? Lumpectomy, radiation and tamoxifen for 5 years.  ? Chronic systolic heart failure ([place]) 05/14/2016  ? Colon polyp   ? Cyst of pancreas   ? Depression 05/26/2013  ? Migraines   ? Moderate tricuspid regurgitation by prior echocardiogram 05/13/2010  ? Osteopenia 12/04/2017  ? Pneumonia due to COVID-19 virus   ? Pulmonary hypertension ([place]) 05/13/2010  ? Pulmonary stenosis 05/13/2010  ? Study Conclusions  - Left ventricle: The cavity size was normal. Systolic   function was mildly reduced. The estimated ejection   fraction  was in the range of 45% to 50%. There is   hypokinesis of the anteroseptal myocardium. Doppler   parameters are consistent with abnormal left ventricular   relaxation (grade 1 diastolic dysfunction). - Right ventricle: The cavity size was mildly dilated. Wall   thickness was normal. Systolic function was mildly   reduced. - Right atrium: The atrium was mildly dilated. - Atrial septum: No atrial level shunt, at baseline with   Doppler analysis. Prior ASD repair, CardioSeal 2002/04/17. - Pulmonic valve: Transvalvular velocity was increased, due   to mild degree of stenosis. Peak velocity: 224cm/s (S). - Pulmonary arteries: Systolic pressure was mildly   increased.    ? 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  ? CardioSEAL device, Apr 17, 2002; [facility]   ? ? ?Past Surgical History:  ?Procedure Laterality Date  ? BREAST LUMPECTOMY    ? left breast cancer, status post lumpectomy, radiation and tamoxifen 5 years  ? CARPAL TUNNEL RELEASE    ? CESAREAN SECTION    ? x4  ? SENTINEL LYMPH NODE BIOPSY    ? ? ?Current Medications: ?Current Meds  ?Medication Sig  ? alum & mag hydroxide-simeth (MAALOX MAX) 400-400-40 MG/5ML suspension Take 15 mLs by mouth every 6 (six) hours as needed for indigestion.  ? aspirin 81 MG tablet Take 81 mg by mouth daily.  ? Cholecalciferol (VITAMIN D) 1000 UNITS capsule Take 1,000 Units  by mouth daily.  ? citalopram (CELEXA) 40 MG tablet TAKE 1 TABLET BY MOUTH EVERY DAY  ? Docusate Calcium (STOOL SOFTENER PO) Take 1 capsule by mouth as needed.   ? LORazepam (ATIVAN) 0.5 MG tablet Take 0.5-1 tablets (0.25-0.5 mg total) by mouth 2 (two) times daily as needed for anxiety.  ? Multiple Vitamin (MULTIVITAMIN) capsule Take 1 capsule by mouth daily.  ? RESTASIS 0.05 % ophthalmic emulsion INSTILL 1 DROP INTO BOTH EYES TWICE A DAY  ?  ? ?Allergies:   Patient has no known allergies.  ? ?Social History  ? ?Socioeconomic History  ? Marital status: Married  ?  Spouse name: Not on file  ? Number  of children: 4  ? Years of education: Not on file  ? Highest education level: Not on file  ?Occupational History  ? Occupation: retired  ?Tobacco Use  ? Smoking status: Former  ? Smokeless tobacco: Never  ?Vaping Use  ? Vaping Use: Never used  ?Substance and Sexual Activity  ? Alcohol use: No  ? Drug use: No  ? Sexual activity: Yes  ?  Partners: Male  ?  Birth control/protection: Post-menopausal  ?Other Topics Concern  ? Not on file  ?Social History Narrative  ? Married. 4 children.   ? 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  ? Takes a daily vitamin.  ? Smoke alarm in the home.  ? Wears her seatbelt.  ? Feels safe in her relationships.  ? ?Social Determinants of Health  ? ?Financial Resource Strain: Low Risk   ? Difficulty of Paying Living Expenses: Not hard at all  ?Food Insecurity: No Food Insecurity  ? Worried About Charity fundraiser in the Last Year: Never true  ? Ran Out of Food in the Last Year: Never true  ?Transportation Needs: No Transportation Needs  ? Lack of Transportation (Medical): No  ? Lack of Transportation (Non-Medical): No  ?Physical Activity: Insufficiently Active  ? Days of Exercise per Week: 3 days  ? Minutes of Exercise per Session: 20 min  ?Stress: Stress Concern Present  ? Feeling of Stress : To some extent  ?Social Connections: Moderately Integrated  ? Frequency of Communication with Friends and Family: Three times a week  ? Frequency of Social Gatherings with Friends and Family: More than three times a week  ? Attends Religious Services: More than 4 times per year  ? Active Member of Clubs or Organizations: Yes  ? Attends Archivist Meetings: 1 to 4 times per year  ? Marital Status: Widowed  ?  ? ?Family History: ?The patient's family history includes Arthritis in her mother; Dementia in her mother; Heart attack in her father and paternal grandmother; Heart disease in her father and paternal grandmother; Hypertension in her sister. ? ?ROS:   ?Please see the history of present  illness.    ?Depressed. Teared.  All other systems reviewed and are negative. ? ?EKGs/Labs/Other Studies Reviewed:   ? ?The following studies were reviewed today: ? ?2 D Doppler Echocardiogram 2020: ?IMPRESSIONS  ? 1. Left ventricular ejection fraction, by visual estimation, is 45%. The  ?left ventricle has mild to moderately decreased function. There is no left  ?ventricular hypertrophy.  ? 2. The left ventricle demonstrates global hypokinesis.  ? 3. Left ventricular diastolic parameters are consistent with Grade I  ?diastolic dysfunction (impaired relaxation).  ? 4. Global right ventricle has normal systolic function.The right  ?ventricular size is mildly enlarged. No increase in right ventricular wall  ?thickness.  ?  5. Left atrial size was normal.  ? 6. Right atrial size was normal.  ? 7. The mitral valve is normal in structure. Mild mitral valve  ?regurgitation. No evidence of mitral stenosis.  ? 8. The tricuspid valve is normal in structure. Tricuspid valve  ?regurgitation mild-moderate.  ? 9. The aortic valve is normal in structure. Aortic valve regurgitation is  ?trivial. No evidence of aortic valve sclerosis or stenosis.  ?10. The pulmonic valve was abnormal. Pulmonic valve regurgitation is  ?trivial.  ?11. Mild pulmonary valve stenosis. Peak velocity 2.05 m/s, peak gradient  ?17 mmHg.  ?12. There is borderline dilatation of the ascending aorta measuring 38 mm.  ?13. Mildly elevated pulmonary artery systolic pressure.  ?14. The inferior vena cava is normal in size with <50% respiratory  ?variability, suggesting right atrial pressure of 8 mmHg.  ?15. S/p ASD repair. No residual shunt detected.  ? ?EKG:  EKG normal sinus rhythm, horizontal axis, biatrial abnormality, otherwise normal. ? ?Recent Labs: ?06/15/2020: TSH 2.82 ?05/11/2021: ALT 21; BUN 6; Creatinine, Ser 0.59; Hemoglobin 14.5; Platelets 191; Potassium 3.3; Sodium 136  ?Recent Lipid Panel ?   ?Component Value Date/Time  ? CHOL 144 07/21/2018 1039  ?  TRIG 55.0 07/21/2018 1039  ? HDL 63.60 07/21/2018 1039  ? CHOLHDL 2 07/21/2018 1039  ? VLDL 11.0 07/21/2018 1039  ? [place] 70 07/21/2018 1039  ? LDLDIRECT 82 06/15/2020 1341  ? ? ?Physical Exam:   ? ?VS:

## 2021-05-21 ENCOUNTER — Encounter: Payer: Self-pay | Admitting: Interventional Cardiology

## 2021-05-21 ENCOUNTER — Ambulatory Visit (INDEPENDENT_AMBULATORY_CARE_PROVIDER_SITE_OTHER): Payer: Medicare Other | Admitting: Interventional Cardiology

## 2021-05-21 VITALS — BP 118/80 | HR 68 | Ht 64.0 in | Wt 145.0 lb

## 2021-05-21 DIAGNOSIS — I5022 Chronic systolic (congestive) heart failure: Secondary | ICD-10-CM | POA: Diagnosis not present

## 2021-05-21 DIAGNOSIS — E785 Hyperlipidemia, unspecified: Secondary | ICD-10-CM

## 2021-05-21 DIAGNOSIS — I370 Nonrheumatic pulmonary valve stenosis: Secondary | ICD-10-CM | POA: Diagnosis not present

## 2021-05-21 DIAGNOSIS — Z8774 Personal history of (corrected) congenital malformations of heart and circulatory system: Secondary | ICD-10-CM

## 2021-05-21 DIAGNOSIS — I071 Rheumatic tricuspid insufficiency: Secondary | ICD-10-CM

## 2021-05-21 DIAGNOSIS — I700 Atherosclerosis of aorta: Secondary | ICD-10-CM

## 2021-05-21 LAB — LIPID PANEL
Chol/HDL Ratio: 3.4 ratio (ref 0.0–4.4)
Cholesterol, Total: 200 mg/dL — ABNORMAL HIGH (ref 100–199)
HDL: 58 mg/dL (ref >39)
LDL Chol Calc (NIH): 131 mg/dL — ABNORMAL HIGH (ref 0–99)
Triglycerides: 63 mg/dL (ref 0–149)
VLDL Cholesterol Cal: 11 mg/dL (ref 5–40)

## 2021-05-21 NOTE — Patient Instructions (Signed)
Medication Instructions:  ?Your physician recommends that you continue on your current medications as directed. Please refer to the Current Medication list given to you today. ? ?REMOVED: Losartan from your medication list ?*If you need a refill on your cardiac medications before your next appointment, please call your pharmacy* ? ? ?Lab Work: ?TODAY: FLP ?If you have labs (blood work) drawn today and your tests are completely normal, you will receive your results only by: ?MyChart Message (if you have MyChart) OR ?A paper copy in the mail ?If you have any lab test that is abnormal or we need to change your treatment, we will call you to review the results. ? ? ?Testing/Procedures: ?Your physician has requested that you have an echocardiogram. Echocardiography is a painless test that uses sound waves to create images of your heart. It provides your doctor with information about the size and shape of your heart and how well your heart?s chambers and valves are working. This procedure takes approximately one hour. There are no restrictions for this procedure. ? ? ? ?Follow-Up: ?At [facility], you and your health needs are our priority.  As part of our continuing mission to provide you with exceptional heart care, we have created designated Provider Care Teams.  These Care Teams include your primary Cardiologist (physician) and Advanced Practice Providers (APPs -  Physician Assistants and Nurse Practitioners) who all work together to provide you with the care you need, when you need it. ? ?Your next appointment:   ?1 year(s) ? ?The format for your next appointment:   ?In Person ? ?Provider:   ?Sinclair Grooms, MD   ? ? ?Important Information About Sugar ? ? ? ? ?  ?

## 2021-06-05 ENCOUNTER — Ambulatory Visit (HOSPITAL_COMMUNITY): Payer: Medicare Other | Attending: Cardiology

## 2021-06-05 DIAGNOSIS — Z8774 Personal history of (corrected) congenital malformations of heart and circulatory system: Secondary | ICD-10-CM | POA: Diagnosis not present

## 2021-06-05 DIAGNOSIS — I5022 Chronic systolic (congestive) heart failure: Secondary | ICD-10-CM | POA: Diagnosis present

## 2021-06-05 DIAGNOSIS — E785 Hyperlipidemia, unspecified: Secondary | ICD-10-CM | POA: Diagnosis not present

## 2021-06-05 DIAGNOSIS — I071 Rheumatic tricuspid insufficiency: Secondary | ICD-10-CM | POA: Diagnosis present

## 2021-06-05 DIAGNOSIS — I370 Nonrheumatic pulmonary valve stenosis: Secondary | ICD-10-CM | POA: Diagnosis not present

## 2021-06-05 LAB — ECHOCARDIOGRAM COMPLETE
Area-P 1/2: 3.56 cm2
S' Lateral: 3.6 cm

## 2021-06-06 ENCOUNTER — Telehealth: Payer: Self-pay

## 2021-06-06 DIAGNOSIS — E785 Hyperlipidemia, unspecified: Secondary | ICD-10-CM

## 2021-06-06 MED ORDER — ROSUVASTATIN CALCIUM 10 MG PO TABS: 10.0000 mg | ORAL_TABLET | Freq: Every day | ORAL | 3 refills | Status: DC

## 2021-06-06 NOTE — Telephone Encounter (Signed)
Patient returning call. ? ?Spoke with patient and discussed results of lipid panel. ? ?Per Dr. Tamala Julian: ?Let the patient know please restart therapy for cholesterol.  Recommend 10 mg of rosuvastatin daily.  Liver and lipid panel in 6 to 8 weeks. ? ?Patient agrees with plan above. ? ?Rosuvastatin '10mg'$  QD ordered and sent to pharmacy of choice. Lipid panel and CMET ordered, lab appt scheduled for 08/01/21. ?

## 2021-08-01 ENCOUNTER — Other Ambulatory Visit: Payer: Medicare Other | Admitting: *Deleted

## 2021-08-01 DIAGNOSIS — E785 Hyperlipidemia, unspecified: Secondary | ICD-10-CM

## 2021-08-01 LAB — COMPREHENSIVE METABOLIC PANEL
ALT: 11 IU/L (ref 0–32)
AST: 24 IU/L (ref 0–40)
Albumin/Globulin Ratio: 1.8 (ref 1.2–2.2)
Albumin: 4 g/dL (ref 3.7–4.7)
Alkaline Phosphatase: 86 IU/L (ref 44–121)
BUN/Creatinine Ratio: 22 (ref 12–28)
BUN: 15 mg/dL (ref 8–27)
Bilirubin Total: 0.4 mg/dL (ref 0.0–1.2)
CO2: 22 mmol/L (ref 20–29)
Calcium: 9.2 mg/dL (ref 8.7–10.3)
Chloride: 107 mmol/L — ABNORMAL HIGH (ref 96–106)
Creatinine, Ser: 0.68 mg/dL (ref 0.57–1.00)
Globulin, Total: 2.2 g/dL (ref 1.5–4.5)
Glucose: 93 mg/dL (ref 70–99)
Potassium: 4.4 mmol/L (ref 3.5–5.2)
Sodium: 143 mmol/L (ref 134–144)
Total Protein: 6.2 g/dL (ref 6.0–8.5)
eGFR: 89 mL/min/{1.73_m2} (ref >59)

## 2021-08-01 LAB — LIPID PANEL
Chol/HDL Ratio: 2 ratio (ref 0.0–4.4)
Cholesterol, Total: 135 mg/dL (ref 100–199)
HDL: 69 mg/dL (ref >39)
LDL Chol Calc (NIH): 56 mg/dL (ref 0–99)
Triglycerides: 44 mg/dL (ref 0–149)
VLDL Cholesterol Cal: 10 mg/dL (ref 5–40)

## 2021-08-30 ENCOUNTER — Other Ambulatory Visit: Payer: Self-pay | Admitting: Family Medicine

## 2021-08-30 DIAGNOSIS — F32A Depression, unspecified: Secondary | ICD-10-CM

## 2021-08-30 DIAGNOSIS — F419 Anxiety disorder, unspecified: Secondary | ICD-10-CM

## 2021-10-11 ENCOUNTER — Ambulatory Visit (INDEPENDENT_AMBULATORY_CARE_PROVIDER_SITE_OTHER): Payer: Medicare Other | Admitting: Family Medicine

## 2021-10-11 VITALS — BP 104/64 | HR 70 | Temp 97.8°F | Ht 64.0 in | Wt 146.0 lb

## 2021-10-11 DIAGNOSIS — F419 Anxiety disorder, unspecified: Secondary | ICD-10-CM

## 2021-10-11 DIAGNOSIS — H1032 Unspecified acute conjunctivitis, left eye: Secondary | ICD-10-CM

## 2021-10-11 DIAGNOSIS — F329 Major depressive disorder, single episode, unspecified: Secondary | ICD-10-CM | POA: Diagnosis not present

## 2021-10-11 DIAGNOSIS — Z23 Encounter for immunization: Secondary | ICD-10-CM | POA: Diagnosis not present

## 2021-10-11 MED ORDER — POLYMYXIN B-TRIMETHOPRIM 10000-0.1 UNIT/ML-% OP SOLN: 1.0000 [drp] | OPHTHALMIC | 0 refills | Status: AC

## 2021-10-11 MED ORDER — CITALOPRAM HYDROBROMIDE 40 MG PO TABS: 40.0000 mg | ORAL_TABLET | Freq: Every day | ORAL | 1 refills | Status: DC

## 2021-10-11 NOTE — Progress Notes (Signed)
[name], female  DOB: [birth year], 81 y.o.   MRN: [id number] Patient Care Team    Relationship Specialty Notifications Start End  Ma Hillock, DO PCP - General Family Medicine  03/03/17   Belva Crome, MD PCP - Cardiology Cardiology Admissions 05/07/17   Magrinat, Virgie Dad, MD (Inactive) Consulting Physician Oncology  03/03/17   Renelda Loma, OD  Optometry  03/03/17   Dian Queen, MD Consulting Physician Obstetrics and Gynecology  03/03/17     Chief Complaint  Patient presents with   Anxiety    Cmc; pt is not fasting    Subjective: Grace Fletcher is a 81 y.o.  Female  present for Depression: Patient reports compliance with Celexa 40 mg daily.  She feels overall she is coping okay after the death of her husband last year.  She states she started to get back out to church weekly and making lunch dates with friends.  Her son is staying with her and is taking good care of her.  Her and her son are walking a few times a week.  She was still have moments where she becomes very tearful. She is lonely sometimes.   Prior note: Many years ago when she was diagnosed with breast cancer. She had discontinued until her son had become incarcerated.  SHe has been on medication since.   She feels the medication has worked well for her at Celexa 40 mg daily.  Her son is now home and living with her and her husband.  Seems to be working out well for them and he is helping around the home.  Her husband unfortunately is in the hospital and has not been doing very well.  They are having a very difficult time keeping the fluid off of him despite high-dose diuretics.     03/27/2021    1:01 PM 06/15/2020    1:20 PM 02/07/2019    2:00 PM 07/21/2018   10:16 AM 11/19/2017    9:13 AM  Depression screen PHQ 2/9  Decreased Interest 0 1 0 0 0  Down, Depressed, Hopeless 1 0 0 0 0  PHQ - 2 Score 1 1 0 0 0  Altered sleeping  1  0 0  Tired, decreased energy  '1  1 1  '$ Change in appetite  0   0 0  Feeling bad or failure about yourself   0  0 0  Trouble concentrating  0  0 0  Moving slowly or fidgety/restless  0  0 0  Suicidal thoughts  0  0 0  PHQ-9 Score  '3  1 1  '$ Difficult doing work/chores    Not difficult at all Not difficult at all      06/15/2020    1:21 PM 07/21/2018   10:17 AM 03/03/2017    9:40 AM  GAD 7 : Generalized Anxiety Score  Nervous, Anxious, on Edge 2 0 0  Control/stop worrying 2 0 0  Worry too much - different things 2 1 0  Trouble relaxing 2 0 0  Restless 0 0 0  Easily annoyed or irritable 0 0 0  Afraid - awful might happen  0 0  Total GAD 7 Score  1 0  Anxiety Difficulty  Not difficult at all    Immunization History  Administered Date(s) Administered   DTaP 04/13/2015   Influenza, High Dose Seasonal PF 11/27/2016, 11/19/2017, 12/26/2018, 12/26/2018, 11/26/2020   Influenza,inj,Quad PF,6+ Mos 11/28/2018   PFIZER(Purple Top)SARS-COV-2  Vaccination 02/08/2019, 03/07/2019, 04/01/2019, 11/24/2019   Pfizer Covid-19 Vaccine Bivalent Booster 78yr & up 11/26/2020   Pneumococcal Conjugate-13 02/07/2019   Pneumococcal Polysaccharide-23 11/19/2017   Past Medical History:  Diagnosis Date   Anxiety 05/26/2013   Arthritis    Breast cancer, left breast ([place] 2006   Lumpectomy, radiation and tamoxifen for 5 years.   [place] 05/14[place]   Colon polyp    Cyst of pancreas    Depression 05/26/2013   Migraines    Moderate tricuspid regurgitation by prior echocardiogram 05/13/2010   Osteopenia 12/04/2017   Pneumonia due to COVID-19 virus    [place] 05[place]13[place]   Pulmonary stenosis 05/13/2010   Study Conclusions  - Left ventricle: The cavity size was normal. Systolic   function was mildly reduced. The estimated ejection   fraction was in the range of 45% to 50%. There is   hypokinesis of the anteroseptal myocardium. Doppler   parameters are consistent with abnormal left ventricular   relaxation (grade 1 diastolic  dysfunction). - Right ventricle: The cavity size was mildly dilated. Wall   thickness was normal. Systolic function was mildly   reduced. - Right atrium: The atrium was mildly dilated. - Atrial septum: No atrial level shunt, at baseline with   Doppler analysis. Prior ASD repair, CardioSeal 2004. - Pulmonic valve: Transvalvular velocity was increased, due   to mild degree of stenosis. Peak velocity: 224cm/s (S). - Pulmonary arteries: Systolic pressure was mildly   increased.     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   [facility]   No Known Allergies Past Surgical History:  Procedure Laterality Date   BREAST LUMPECTOMY     left breast cancer, status post lumpectomy, radiation and tamoxifen 5 years   CARPAL TUNNEL RELEASE     CESAREAN SECTION     x4   SENTINEL LYMPH NODE BIOPSY     Family History  Problem Relation Age of Onset   Dementia Mother    Arthritis Mother    Heart attack Father    Heart disease Father    Hypertension Sister    Heart attack Paternal Grandmother    Heart disease Paternal Grandmother    Social History   Social History Narrative   Married. 4 children.    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   Takes a daily vitamin.   Smoke alarm in the home.   Wears her seatbelt.   Feels safe in her relationships.    Allergies as of 10/11/2021   No Known Allergies      Medication List        Accurate as of October 11, 2021  1:43 PM. If you have any questions, ask your nurse or doctor.          STOP taking these medications    LORazepam 0.5 MG tablet Commonly known as: ATIVAN Stopped by: RHoward Pouch DO   Maalox Max 400-400-40 MG/5ML suspension Generic drug: alum & mag hydroxide-simeth Stopped by: RHoward Pouch DO   Restasis 0.05 % ophthalmic emulsion Generic drug: cycloSPORINE Stopped by: RHoward Pouch DO       TAKE these medications    aspirin 81 MG tablet Take 81 mg by mouth daily.    citalopram 40 MG tablet Commonly known as: CELEXA TAKE 1 TABLET BY MOUTH EVERY DAY   multivitamin capsule Take 1 capsule by mouth daily.   rosuvastatin 10 MG tablet Commonly known as: CRESTOR Take 1 tablet (10  mg total) by mouth daily.   STOOL SOFTENER PO Take 1 capsule by mouth as needed.   Vitamin D 1000 units capsule Take 1,000 Units by mouth daily.        All past medical history, surgical history, allergies, family history, immunizations andmedications were updated in the EMR today and reviewed under the history and medication portions of their EMR.       ROS: 14 pt review of systems performed and negative (unless mentioned in an HPI)  Objective: BP 104/64   Pulse 70   Temp 97.8 F (36.6 C) (Oral)   Ht '5\' 4"'$  (1.626 m)   Wt 146 lb (66.2 kg)   SpO2 98%   BMI 25.06 kg/m  Physical Exam Vitals and nursing note reviewed.  Constitutional:      General: She is not in acute distress.    Appearance: Normal appearance. She is not ill-appearing, toxic-appearing or diaphoretic.  HENT:     Head: Normocephalic and atraumatic.  Eyes:     General: No scleral icterus.       Right eye: No discharge.        Left eye: No discharge.     Extraocular Movements: Extraocular movements intact.     Conjunctiva/sclera: Conjunctivae normal.     Pupils: Pupils are equal, round, and reactive to light.  Neurological:     Mental Status: She is alert and oriented to person, place, and time. Mental status is at baseline.     Motor: No weakness.     Gait: Gait normal.  Psychiatric:        Mood and Affect: Mood normal.        Behavior: Behavior normal.        Thought Content: Thought content normal.        Judgment: Judgment normal.    No results found.  Assessment/plan: Grace Fletcher is a 81 y.o. female present for [facility] Depression, unspecified depression type/Anxiety stable  Continue  Celexa 40 mg daily. No longer using Ativan  Follow-up 5.5 months for chronic medical  conditions. Labs due at that time.   Acute bacterial conjunctivitis of left eye Polysporin prescribed   Influenza vaccine given today.   No follow-ups on file.  Orders Placed This Encounter  Procedures   Flu Vaccine QUAD High Dose(Fluad)   No orders of the defined types were placed in this encounter.  Referral Orders  No referral(s) requested today     Electronically signed by: Howard Pouch, [place]

## 2021-10-11 NOTE — Patient Instructions (Signed)
No follow-ups on file.        Great to see you today.  I have refilled the medication(s) we provide.   If labs were collected, we will inform you of lab results once received either by echart message or telephone call.   - echart message- for normal results that have been seen by the patient already.   - telephone call: abnormal results or if patient has not viewed results in their echart.  

## 2022-01-10 ENCOUNTER — Telehealth: Payer: Self-pay | Admitting: Family Medicine

## 2022-01-10 NOTE — Telephone Encounter (Signed)
Patient called and declined scheduling an appointment. She continues to have a cough and was prescribed prednisone from another clinic and was not directed. I informed her since Dr. Raoul Pitch did not prescribe this she would need to schedule and appointment with her to discuss this ongoing cough, however she refused and states she only wants advice on how long she should be taking the medication being as it only says take as directed. I advised her to call the clinic back or call the pharmacy.

## 2022-01-10 NOTE — Telephone Encounter (Signed)
noted 

## 2022-03-10 LAB — HM MAMMOGRAPHY

## 2022-03-11 ENCOUNTER — Telehealth: Payer: Self-pay

## 2022-03-11 NOTE — Telephone Encounter (Signed)
Received Mammogram results from [place] on 03/11/22. Will place on PCP desk for review.

## 2022-03-14 NOTE — Telephone Encounter (Signed)
Please inform patient her mammogram is normal.  

## 2022-03-14 NOTE — Telephone Encounter (Signed)
Spoke with patient regarding results/recommendations.  

## 2022-03-28 ENCOUNTER — Encounter: Payer: Self-pay | Admitting: Family Medicine

## 2022-03-28 ENCOUNTER — Ambulatory Visit (INDEPENDENT_AMBULATORY_CARE_PROVIDER_SITE_OTHER): Payer: Medicare Other | Admitting: Family Medicine

## 2022-03-28 VITALS — BP 126/73 | HR 69 | Temp 97.8°F | Wt 153.8 lb

## 2022-03-28 DIAGNOSIS — F419 Anxiety disorder, unspecified: Secondary | ICD-10-CM | POA: Diagnosis not present

## 2022-03-28 DIAGNOSIS — F329 Major depressive disorder, single episode, unspecified: Secondary | ICD-10-CM

## 2022-03-28 MED ORDER — CITALOPRAM HYDROBROMIDE 40 MG PO TABS: 40.0000 mg | ORAL_TABLET | Freq: Every day | ORAL | 1 refills | Status: DC

## 2022-03-28 NOTE — Patient Instructions (Addendum)
Return in about 24 weeks (around 09/12/2022) for Routine chronic condition follow-up.        Great to see you today.  I have refilled the medication(s) we provide.   If labs were collected, we will inform you of lab results once received either by echart message or telephone call.   - echart message- for normal results that have been seen by the patient already.   - telephone call: abnormal results or if patient has not viewed results in their echart.

## 2022-03-28 NOTE — Progress Notes (Signed)
[name], female  DOB: [birth year], 82 y.o.   MRN: [id number] Patient Care Team    Relationship Specialty Notifications Start End  Ma Hillock, DO PCP - General Family Medicine  03/03/17   Belva Crome, MD (Inactive) PCP - Cardiology Cardiology Admissions 05/07/17   Magrinat, Virgie Dad, MD (Inactive) Consulting Physician Oncology  03/03/17   Renelda Loma, OD  Optometry  03/03/17   Dian Queen, MD Consulting Physician Obstetrics and Gynecology  03/03/17     Chief Complaint  Patient presents with   Depression    Subjective: Grace Fletcher is a 82 y.o.  Female  present for Depression: Patient reports compliance with Celexa 40 mg daily.  She states she is feeling ok. Some days are worse than others.   Prior note: She feels overall she is coping okay after the death of her husband last year.  She states she started to get back out to church weekly and making lunch dates with friends.  Her son is staying with her and is taking good care of her.  Her and her son are walking a few times a week.  She was still have moments where she becomes very tearful. She is lonely sometimes.   Prior note: Many years ago when she was diagnosed with breast cancer. She had discontinued until her son had become incarcerated.  SHe has been on medication since.   She feels the medication has worked well for her at Celexa 40 mg daily.  Her son is now home and living with her and her husband.  Seems to be working out well for them and he is helping around the home.  Her husband unfortunately is in the hospital and has not been doing very well.  They are having a very difficult time keeping the fluid off of him despite high-dose diuretics.     03/28/2022    1:16 PM 03/28/2022    1:15 PM 10/11/2021    1:44 PM 03/27/2021    1:01 PM 06/15/2020    1:20 PM  Depression screen PHQ 2/9  Decreased Interest 0 0 1 0 1  Down, Depressed, Hopeless 1 1 0 1 0  PHQ - 2 Score '1 1 1 1 1  '$ Altered sleeping 1   0  1  Tired, decreased energy '1  1  1  '$ Change in appetite 1  1  0  Feeling bad or failure about yourself    1  0  Trouble concentrating 1  0  0  Moving slowly or fidgety/restless 0  0  0  Suicidal thoughts 0  0  0  PHQ-9 Score '5  4  3      '$ 03/28/2022    1:15 PM 10/11/2021    1:44 PM 06/15/2020    1:21 PM 07/21/2018   10:17 AM  GAD 7 : Generalized Anxiety Score  Nervous, Anxious, on Edge 1 0 2 0  Control/stop worrying 1 0 2 0  Worry too much - different things 0 0 2 1  Trouble relaxing 0 0 2 0  Restless 0 0 0 0  Easily annoyed or irritable 0 0 0 0  Afraid - awful might happen 0 0  0  Total GAD 7 Score 2 0  1  Anxiety Difficulty    Not difficult at all   Immunization History  Administered Date(s) Administered   DTaP 04/13/2015   Fluad Quad(high Dose 65+) 10/11/2021   Influenza, High Dose  Seasonal PF 11/27/2016, 11/19/2017, 12/26/2018, 12/26/2018, 11/26/2020   Influenza,inj,Quad PF,6+ Mos 11/28/2018   PFIZER(Purple Top)SARS-COV-2 Vaccination 02/08/2019, 03/07/2019, 04/01/2019, 11/24/2019   Pfizer Covid-19 Vaccine Bivalent Booster 63yr & up 11/26/2020   Pneumococcal Conjugate-13 02/07/2019   Pneumococcal Polysaccharide-23 11/19/2017   Past Medical History:  Diagnosis Date   Anxiety 05/26/2013   Arthritis    [place]ast cancer, left breast [place] 2006   Lumpectomy, radiation and tamoxifen for 5 years.   [place]   Colon polyp    Cyst of pancreas    Depression 05/26/2013   Migraines    Moderate tricuspid regurgitation by prior echocardiogram 05/13/2010   Osteopenia 12/04/2017   Pneumonia due to COVID-19 virus    [place]   Pulmonary stenosis 05/13/2010   Study Conclusions  - Left ventricle: The cavity size was normal. Systolic   function was mildly reduced. The estimated ejection   fraction was in the range of 45% to 50%. There is   hypokinesis of the anteroseptal myocardium. Doppler   parameters are consistent with  abnormal left ventricular   relaxation (grade 1 diastolic dysfunction). - Right ventricle: The cavity size was mildly dilated. Wall   thickness was normal. Systolic function was mildly   reduced. - Right atrium: The atrium was mildly dilated. - Atrial septum: No atrial level shunt, at baseline with   Doppler analysis. Prior ASD repair, CardioSeal 2004. - Pulmonic valve: Transvalvular velocity was increased, due   to mild degree of stenosis. Peak velocity: 224cm/s (S). - Pulmonary arteries: Systolic pressure was mildly   increased.     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 4 children.    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   Takes a daily vitamin.   Smoke alarm in the home.   Wears her seatbelt.   Feels safe in her relationships.    Allergies as of 03/28/2022   No Known Allergies      Medication List        Accurate as of March 28, 2022  1:26 PM. If you have any questions, ask your nurse or doctor.          aspirin 81 MG tablet Take 81 mg by mouth daily.   citalopram 40 MG tablet Commonly known as: CELEXA Take 1 tablet (40 mg total) by mouth daily.   multivitamin capsule Take 1 capsule by mouth daily.   rosuvastatin 10 MG tablet Commonly known as: CRESTOR Take 1 tablet (10 mg total) by mouth daily.   STOOL SOFTENER PO Take 1 capsule by mouth as needed.    Vitamin D 1000 units capsule Take 1,000 Units by mouth daily.        All past medical history, surgical history, allergies, family history,  immunizations andmedications were updated in the EMR today and reviewed under the history and medication portions of their EMR.       ROS: 14 pt review of systems performed and negative (unless mentioned in an HPI)  Objective: BP 126/73   Pulse 69   Temp 97.8 F (36.6 C)   Wt 153 lb 12.8 oz (69.8 kg)   SpO2 96%   BMI 26.40 kg/m  Physical Exam Vitals and nursing note reviewed.  Constitutional:      General: She is not in acute distress.    Appearance: Normal appearance. She is not ill-appearing, toxic-appearing or diaphoretic.  HENT:     Head: Normocephalic and atraumatic.  Eyes:     General: No scleral icterus.       Right eye: No discharge.        Left eye: No discharge.     Extraocular Movements: Extraocular movements intact.     Conjunctiva/sclera: Conjunctivae normal.     Pupils: Pupils are equal, round, and reactive to light.  Neurological:     Mental Status: She is alert and oriented to person, place, and time. Mental status is at baseline.     Motor: No weakness.     Gait: Gait normal.  Psychiatric:        Mood and Affect: Mood normal.        Behavior: Behavior normal.        Thought Content: Thought content normal.        Judgment: Judgment normal.    No results found.  Assessment/plan: Grace Fletcher is a 82 y.o. female present for Chronic Conditions/illness Management Depression, unspecified depression type/Anxiety Stable Continue Celexa 40 mg daily. No longer using Ativan  Follow-up 5.5 months for chronic medical conditions. Labs due at that time.     Return in about 24 weeks (around 09/12/2022) for Routine chronic condition follow-up.  No orders of the defined types were placed in this encounter.  Meds ordered this encounter  Medications   citalopram (CELEXA) 40 MG tablet    Sig: Take 1 tablet (40  mg total) by mouth daily.    Dispense:  90 tablet    Refill:  1   Referral Orders  No referral(s) requested today     Electronically signed by: Howard Pouch, [place]

## 2022-04-02 ENCOUNTER — Ambulatory Visit (INDEPENDENT_AMBULATORY_CARE_PROVIDER_SITE_OTHER): Payer: Medicare Other

## 2022-04-02 DIAGNOSIS — Z0000 Encounter for general adult medical examination without abnormal findings: Secondary | ICD-10-CM | POA: Diagnosis not present

## 2022-04-02 NOTE — Progress Notes (Signed)
I connected with  Grace Fletcher on 04/02/22 by a audio enabled telemedicine application and verified that I am speaking with the correct person using two identifiers.  Patient Location: Home  Provider Location: Home Office  I discussed the limitations of evaluation and management by telemedicine. The patient expressed understanding and agreed to proceed.   Subjective:   Grace Fletcher is a 82 y.o. female who presents for Medicare Annual (Subsequent) preventive examination.  Review of Systems     Cardiac Risk Factors include: advanced age (>33mn, >>1women);hypertension;dyslipidemia     Objective:    There were no vitals filed for this visit. There is no height or weight on file to calculate BMI.     04/02/2022    1:13 PM 05/11/2021   12:45 PM 03/27/2021    1:02 PM 05/24/2015    2:10 PM 05/23/2014   12:45 PM  Advanced Directives  Does Patient Have a Medical Advance Directive? Yes Yes Yes No No  Type of AParamedicof [place]Living will [place] will [place]   Does patient want to make changes to medical advance directive?  No - Patient declined     Copy of [place] Chart? No - copy requested No - copy requested No - copy requested      Current Medications (verified) Outpatient Encounter Medications as of 04/02/2022  Medication Sig   aspirin 81 MG tablet Take 81 mg by mouth daily.   Cholecalciferol (VITAMIN D) 1000 UNITS capsule Take 1,000 Units by mouth daily.   citalopram (CELEXA) 40 MG tablet Take 1 tablet (40 mg total) by mouth daily.   Docusate Calcium (STOOL SOFTENER PO) Take 1 capsule by mouth as needed.    Multiple Vitamin (MULTIVITAMIN) capsule Take 1 capsule by mouth daily.   rosuvastatin (CRESTOR) 10 MG tablet Take 1 tablet (10 mg total) by mouth daily.   No facility-administered encounter medications on file as of 04/02/2022.    Allergies (verified) Patient has no  known allergies.   History: Past Medical History:  Diagnosis Date   Anxiety 05/26/2013   Arthritis    Breast cancer, left breast ([facility] 2006   Lumpectomy, radiation and tamoxifen for 5 years.   Chronic systolic heart failure ([facility] 05/14/2016   Colon polyp    Cyst of pancreas    Depression 05/26/2013   Migraines    Moderate tricuspid regurgitation by prior echocardiogram 05/13/2010   Osteopenia 12/04/2017   Pneumonia due to COVID-19 virus    Pulmonary hypertension ([facility] 05/13/2010   Pulmonary stenosis 05/13/2010   Study Conclusions  - Left ventricle: The cavity size was normal. Systolic   function was mildly reduced. The estimated ejection   fraction was in the range of 45% to 50%. There is   hypokinesis of the anteroseptal myocardium. Doppler   parameters are consistent with abnormal left ventricular   relaxation (grade 1 diastolic dysfunction). - Right ventricle: The cavity size was mildly dilated. Wall   thickness was normal. Systolic function was mildly   reduced. - Right atrium: The atrium was mildly dilated. - Atrial septum: No atrial level shunt, at baseline with   Doppler analysis. Prior ASD repair, CardioSeal 2004. - Pulmonic valve: Transvalvular velocity was increased, due   to mild degree of stenosis. Peak velocity: 224cm/s (S). - Pulmonary arteries: Systolic pressure was mildly   increased.     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   CardioSEAL device,  2004; [facility]    Past Surgical History:  Procedure Laterality Date   BREAST LUMPECTOMY     left breast cancer, status post lumpectomy, radiation and tamoxifen 5 years   CARPAL TUNNEL RELEASE     CESAREAN SECTION     x4   SENTINEL LYMPH NODE BIOPSY     Family History  Problem Relation Age of Onset   Dementia Mother    Arthritis Mother    Heart attack Father    Heart disease Father    Hypertension Sister    Heart attack Paternal Grandmother    Heart disease Paternal Grandmother    Social  History   Socioeconomic History   Marital status: Widowed    Spouse name: Not on file   Number of children: 4   Years of education: Not on file   Highest education level: Some college, no degree  Occupational History   Occupation: retired  Tobacco Use   Smoking status: Former   Smokeless tobacco: Never  Scientific laboratory technician Use: Never used  Substance and Sexual Activity   Alcohol use: No   Drug use: No   Sexual activity: Yes    Partners: Male    Birth control/protection: Post-menopausal  Other Topics Concern   Not on file  Social History Narrative   Married. 4 children.    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   Takes a daily vitamin.   Smoke alarm in the home.   Wears her seatbelt.   Feels safe in her relationships.   Social Determinants of Health   Financial Resource Strain: Low Risk  (04/02/2022)   Overall Financial Resource Strain (CARDIA)    Difficulty of Paying Living Expenses: Not hard at all  Food Insecurity: No Food Insecurity (04/02/2022)   Hunger Vital Sign    Worried About Running Out of Food in the Last Year: Never true    Ran Out of Food in the Last Year: Never true  Transportation Needs: No Transportation Needs (04/02/2022)   PRAPARE - Hydrologist (Medical): No    Lack of Transportation (Non-Medical): No  Physical Activity: Insufficiently Active (04/02/2022)   Exercise Vital Sign    Days of Exercise per Week: 3 days    Minutes of Exercise per Session: 40 min  Stress: Stress Concern Present (04/02/2022)   [place]    Feeling of Stress : To some extent  Social Connections: Moderately Isolated (04/02/2022)   Social Connection and Isolation Panel [NHANES]    Frequency of Communication with Friends and Family: More than three times a week    Frequency of Social Gatherings with Friends and Family: More than three times a week    Attends Religious Services: More than 4 times  per year    Active Member of [employer] or Organizations: No    Attends Archivist Meetings: Never    Marital Status: Widowed    Tobacco Counseling Counseling given: Not Answered   Clinical Intake:  Pre-visit preparation completed: Yes  Pain : No/denies pain     BMI - recorded: 26.4 Nutritional Status: BMI 25 -29 Overweight Nutritional Risks: None Diabetes: No  How often do you need to have someone help you when you read instructions, pamphlets, or other written materials from your doctor or pharmacy?: 1 - Never  Diabetic?no  Interpreter Needed?: No  Information entered by :: Charlott Rakes, LPN   Activities of Daily Living  04/02/2022    1:14 PM  In your present state of health, do you have any difficulty performing the following activities:  Hearing? 0  Vision? 0  Difficulty concentrating or making decisions? 0  Walking or climbing stairs? 0  Dressing or bathing? 0  Doing errands, shopping? 0  Preparing Food and eating ? N  Using the Toilet? N  In the past six months, have you accidently leaked urine? N  Do you have problems with loss of bowel control? N  Managing your Medications? N  Managing your Finances? N  Housekeeping or managing your Housekeeping? N    Patient Care Team: Ma Hillock, DO as PCP - General (Family Medicine) Belva Crome, MD (Inactive) as PCP - Cardiology (Cardiology) Magrinat, Virgie Dad, MD (Inactive) as Consulting Physician (Oncology) Renelda Loma, OD (Optometry) Dian Queen, MD as Consulting Physician (Obstetrics and Gynecology)  Indicate any recent Medical Services you may have received from other than [facility] providers in the past year (date may be approximate).     Assessment:   This is a routine wellness examination for [place].  Hearing/Vision screen Hearing Screening - Comments:: Pt denies any hearing issues  Vision Screening - Comments:: Pt [facility] for annual [facility] exams   Dietary issues  and exercise activities discussed: Current Exercise Habits: Home exercise routine, Type of exercise: walking, Time (Minutes): 40, Frequency (Times/Week): 3, Weekly Exercise (Minutes/Week): 120   Goals Addressed             This Visit's Progress    Patient Stated       Stay healthy        Depression Screen    04/02/2022    1:10 PM 03/28/2022    1:16 PM 03/28/2022    1:15 PM 10/11/2021    1:44 PM 03/27/2021    1:01 PM 06/15/2020    1:20 PM 02/07/2019    2:00 PM  PHQ 2/9 Scores  PHQ - 2 Score 0 '1 1 1 1 1 '$ 0  PHQ- 9 Score 0 '5  4  3     '$ Fall Risk    04/02/2022    1:13 PM 03/28/2022    1:15 PM 10/11/2021   10:56 AM 03/27/2021    1:03 PM 06/15/2020    1:05 PM  Fall Risk   Falls in the past year? 0 0 0 0 0  Number falls in past yr: 0   0 0  Injury with Fall? 0   0 0  Risk for fall due to : Impaired vision;Impaired balance/gait   Impaired vision   Follow up Falls prevention discussed Falls evaluation completed  Falls prevention discussed Falls evaluation completed    FALL RISK PREVENTION PERTAINING TO THE HOME:  Any stairs in or around the home? Yes  If so, are there any without handrails? No  Home free of loose throw rugs in walkways, pet beds, electrical cords, etc? Yes  Adequate lighting in your home to reduce risk of falls? Yes   ASSISTIVE DEVICES UTILIZED TO PREVENT FALLS:  Life alert? No  Use of a cane, walker or w/c? No  Grab bars in the bathroom? Yes  Shower chair or bench in shower? No  Elevated toilet seat or a handicapped toilet? Yes   TIMED UP AND GO:  Was the test performed? No .  Cognitive Function:        04/02/2022    1:14 PM 03/27/2021    1:06 PM  6CIT Screen  What Year? 0 points 0 points  What month? 0 points 0 points  What time? 0 points 0 points  Count back from 20 0 points 0 points  Months in reverse 0 points 0 points  Repeat phrase 0 points 0 points  Total Score 0 points 0 points    Immunizations Immunization History  Administered Date(s)  Administered   DTaP 04/13/2015   Fluad Quad(high Dose 65+) 10/11/2021   Influenza, High Dose Seasonal PF 11/27/2016, 11/19/2017, 12/26/2018, 12/26/2018, 11/26/2020   Influenza,inj,Quad PF,6+ Mos 11/28/2018   PFIZER(Purple Top)SARS-COV-2 Vaccination 02/08/2019, 03/07/2019, 04/01/2019, 11/24/2019   Pfizer Covid-19 Vaccine Bivalent Booster 26yr & up 11/26/2020   Pneumococcal Conjugate-13 02/07/2019   Pneumococcal Polysaccharide-23 11/19/2017    TDAP status: Up to date  Flu Vaccine status: Up to date  Pneumococcal vaccine status: Up to date  Covid-19 vaccine status: Completed vaccines  Qualifies for Shingles Vaccine? Yes   Zostavax completed No   Shingrix Completed?: No.    Education has been provided regarding the importance of this vaccine. Patient has been advised to call insurance company to determine out of pocket expense if they have not yet received this vaccine. Advised may also receive vaccine at local pharmacy or Health Dept. Verbalized acceptance and understanding.  Screening Tests Health Maintenance  Topic Date Due   DEXA SCAN  11/27/2019   Zoster Vaccines- Shingrix (1 of 2) 06/28/2022 (Originally 02/26/1959)   MAMMOGRAM  03/11/2023   Medicare Annual Wellness (AWV)  04/02/2023   DTaP/Tdap/Td (2 - Tdap) 04/12/2025   Pneumonia Vaccine 80 Years old  Completed   INFLUENZA VACCINE  Completed   HPV VACCINES  Aged Out   COVID-19 Vaccine  Discontinued    Health Maintenance  Health Maintenance Due  Topic Date Due   DEXA SCAN  11/27/2019    Colorectal cancer screening: No longer required.   Mammogram status: Completed 03/10/22. Repeat every year  Bone Density status: Completed 11/26/17. Results reflect: Bone density results: OSTEOPENIA. Repeat every 2 years.  Additional Screening:   Vision Screening: Recommended annual ophthalmology exams for early detection of glaucoma and other disorders of the eye. Is the patient up to date with their annual eye exam?  Yes  Who  is the provider or what is the name of the office in which the patient attends annual eye exams? [facility]  If pt is not established with a provider, would they like to be referred to a provider to establish care? No .   Dental Screening: Recommended annual dental exams for proper oral hygiene  Community Resource Referral / Chronic Care Management: CRR required this visit?  No   CCM required this visit?  No      Plan:     I have personally reviewed and noted the following in the patient's chart:   Medical and social history Use of alcohol, tobacco or illicit drugs  Current medications and supplements including opioid prescriptions. Patient is not currently taking opioid prescriptions. Functional ability and status Nutritional status Physical activity Advanced directives List of other physicians Hospitalizations, surgeries, and ER visits in previous 12 months Vitals Screenings to include cognitive, depression, and falls Referrals and appointments  In addition, I have reviewed and discussed with patient certain preventive protocols, quality metrics, and best practice recommendations. A written personalized care plan for preventive services as well as general preventive health recommendations were provided to patient.     TWillette Brace LPN   [id number]  Nurse Notes: none

## 2022-04-02 NOTE — Patient Instructions (Signed)
Grace Fletcher , Thank you for taking time to come for your Medicare Wellness Visit. I appreciate your ongoing commitment to your health goals. Please review the following plan we discussed and let me know if I can assist you in the future.   These are the goals we discussed:  Goals      lose  a little weight        This is a list of the screening recommended for you and due dates:  Health Maintenance  Topic Date Due   DEXA scan (bone density measurement)  11/27/2019   Zoster (Shingles) Vaccine (1 of 2) 06/28/2022*   Mammogram  03/11/2023   Medicare Annual Wellness Visit  04/02/2023   DTaP/Tdap/Td vaccine (2 - Tdap) 04/12/2025   Pneumonia Vaccine  Completed   Flu Shot  Completed   HPV Vaccine  Aged Out   COVID-19 Vaccine  Discontinued  *Topic was postponed. The date shown is not the original due date.    Advanced directives: Please bring a copy of your health care power of attorney and living will to the office at your convenience.  Conditions/risks identified: stay healthy   Next appointment: Follow up in one year for your annual wellness visit    Preventive Care 65 Years and Older, Female Preventive care refers to lifestyle choices and visits with your health care provider that can promote health and wellness. What does preventive care include? A yearly physical exam. This is also called an annual well check. Dental exams once or twice a year. Routine eye exams. Ask your health care provider how often you should have your eyes checked. Personal lifestyle choices, including: Daily care of your teeth and gums. Regular physical activity. Eating a healthy diet. Avoiding tobacco and drug use. Limiting alcohol use. Practicing safe sex. Taking low-dose aspirin every day. Taking vitamin and mineral supplements as recommended by your health care provider. What happens during an annual well check? The services and screenings done by your health care provider during your annual  well check will depend on your age, overall health, lifestyle risk factors, and family history of disease. Counseling  Your health care provider may ask you questions about your: Alcohol use. Tobacco use. Drug use. Emotional well-being. Home and relationship well-being. Sexual activity. Eating habits. History of falls. Memory and ability to understand (cognition). Work and work Statistician. Reproductive health. Screening  You may have the following tests or measurements: Height, weight, and BMI. Blood pressure. Lipid and cholesterol levels. These may be checked every 5 years, or more frequently if you are over 80 years old. Skin check. Lung cancer screening. You may have this screening every year starting at age 80 if you have a 30-pack-year history of smoking and currently smoke or have quit within the past 15 years. Fecal occult blood test (FOBT) of the stool. You may have this test every year starting at age 80. Flexible sigmoidoscopy or colonoscopy. You may have a sigmoidoscopy every 5 years or a colonoscopy every 10 years starting at age 80. Hepatitis C blood test. Hepatitis B blood test. Sexually transmitted disease (STD) testing. Diabetes screening. This is done by checking your blood sugar (glucose) after you have not eaten for a while (fasting). You may have this done every 1-3 years. Bone density scan. This is done to screen for osteoporosis. You may have this done starting at age 80. Mammogram. This may be done every 1-2 years. Talk to your health care provider about how often you should have  regular mammograms. Talk with your health care provider about your test results, treatment options, and if necessary, the need for more tests. Vaccines  Your health care provider may recommend certain vaccines, such as: Influenza vaccine. This is recommended every year. Tetanus, diphtheria, and acellular pertussis (Tdap, Td) vaccine. You may need a Td booster every 10 years. Zoster  vaccine. You may need this after age 80. Pneumococcal 13-valent conjugate (PCV13) vaccine. One dose is recommended after age 80. Pneumococcal polysaccharide (PPSV23) vaccine. One dose is recommended after age 80. Talk to your health care provider about which screenings and vaccines you need and how often you need them. This information is not intended to replace advice given to you by your health care provider. Make sure you discuss any questions you have with your health care provider. Document Released: 02/09/2015 Document Revised: 10/03/2015 Document Reviewed: 11/14/2014 Elsevier Interactive Patient Education  2017 [place] Prevention in the Home Falls can cause injuries. They can happen to people of all ages. There are many things you can do to make your home safe and to help prevent falls. What can I do on the outside of my home? Regularly fix the edges of walkways and driveways and fix any cracks. Remove anything that might make you trip as you walk through a door, such as a raised step or threshold. Trim any bushes or trees on the path to your home. Use bright outdoor lighting. Clear any walking paths of anything that might make someone trip, such as rocks or tools. Regularly check to see if handrails are loose or broken. Make sure that both sides of any steps have handrails. Any raised decks and porches should have guardrails on the edges. Have any leaves, snow, or ice cleared regularly. Use sand or salt on walking paths during winter. Clean up any spills in your garage right away. This includes oil or grease spills. What can I do in the bathroom? Use night lights. Install grab bars by the toilet and in the tub and shower. Do not use towel bars as grab bars. Use non-skid mats or decals in the tub or shower. If you need to sit down in the shower, use a plastic, non-slip stool. Keep the floor dry. Clean up any water that spills on the floor as soon as it happens. Remove  soap buildup in the tub or shower regularly. Attach bath mats securely with double-sided non-slip rug tape. Do not have throw rugs and other things on the floor that can make you trip. What can I do in the bedroom? Use night lights. Make sure that you have a light by your bed that is easy to reach. Do not use any sheets or blankets that are too big for your bed. They should not hang down onto the floor. Have a firm chair that has side arms. You can use this for support while you get dressed. Do not have throw rugs and other things on the floor that can make you trip. What can I do in the kitchen? Clean up any spills right away. Avoid walking on wet floors. Keep items that you use a lot in easy-to-reach places. If you need to reach something above you, use a strong step stool that has a grab bar. Keep electrical cords out of the way. Do not use floor polish or wax that makes floors slippery. If you must use wax, use non-skid floor wax. Do not have throw rugs and other things on the floor that  can make you trip. What can I do with my stairs? Do not leave any items on the stairs. Make sure that there are handrails on both sides of the stairs and use them. Fix handrails that are broken or loose. Make sure that handrails are as long as the stairways. Check any carpeting to make sure that it is firmly attached to the stairs. Fix any carpet that is loose or worn. Avoid having throw rugs at the top or bottom of the stairs. If you do have throw rugs, attach them to the floor with carpet tape. Make sure that you have a light switch at the top of the stairs and the bottom of the stairs. If you do not have them, ask someone to add them for you. What else can I do to help prevent falls? Wear shoes that: Do not have high heels. Have rubber bottoms. Are comfortable and fit you well. Are closed at the toe. Do not wear sandals. If you use a stepladder: Make sure that it is fully opened. Do not climb a  closed stepladder. Make sure that both sides of the stepladder are locked into place. Ask someone to hold it for you, if possible. Clearly mark and make sure that you can see: Any grab bars or handrails. First and last steps. Where the edge of each step is. Use tools that help you move around (mobility aids) if they are needed. These include: Canes. Walkers. Scooters. Crutches. Turn on the lights when you go into a dark area. Replace any light bulbs as soon as they burn out. Set up your furniture so you have a clear path. Avoid moving your furniture around. If any of your floors are uneven, fix them. If there are any pets around you, be aware of where they are. Review your medicines with your doctor. Some medicines can make you feel dizzy. This can increase your chance of falling. Ask your doctor what other things that you can do to help prevent falls. This information is not intended to replace advice given to you by your health care provider. Make sure you discuss any questions you have with your health care provider. Document Released: 11/09/2008 Document Revised: 06/21/2015 Document Reviewed: 02/17/2014 Elsevier Interactive Patient Education  2017 [employer].

## 2022-05-26 ENCOUNTER — Other Ambulatory Visit: Payer: Self-pay | Admitting: *Deleted

## 2022-05-26 MED ORDER — ROSUVASTATIN CALCIUM 10 MG PO TABS: 10.0000 mg | ORAL_TABLET | Freq: Every day | ORAL | 0 refills | Status: DC

## 2022-08-12 ENCOUNTER — Telehealth: Payer: Self-pay | Admitting: Family Medicine

## 2022-08-12 NOTE — Telephone Encounter (Signed)
Prescription Request  08/12/2022  LOV: 03/28/2022  Patient is scheduled for 8/16, but will be out of citalopram by then and is asking for a temp fill until her appointment.   What is the name of the medication or equipment? citalopram (CELEXA) 40 MG tablet   Have you contacted your pharmacy to request a refill? No   [phone] [place] [place] - [address] [place] [address] Phone: [phone] Fax: [phone]  [facility] - [place], [place] - [address] [place] [address] Phone: [phone] Fax: [phone]    Patient notified that their request is being sent to the clinical staff for review and that they should receive a response within 2 business days.   Please advise at Mobile [phone] (mobile)

## 2022-08-12 NOTE — Telephone Encounter (Signed)
Spoke with patient regarding results/recommendations.  

## 2022-08-27 ENCOUNTER — Encounter (INDEPENDENT_AMBULATORY_CARE_PROVIDER_SITE_OTHER): Payer: Self-pay

## 2022-09-12 ENCOUNTER — Encounter: Payer: Self-pay | Admitting: Family Medicine

## 2022-09-12 ENCOUNTER — Ambulatory Visit (INDEPENDENT_AMBULATORY_CARE_PROVIDER_SITE_OTHER): Payer: Medicare Other | Admitting: Family Medicine

## 2022-09-12 VITALS — BP 118/77 | HR 62 | Temp 98.1°F | Wt 147.0 lb

## 2022-09-12 DIAGNOSIS — Z23 Encounter for immunization: Secondary | ICD-10-CM | POA: Diagnosis not present

## 2022-09-12 DIAGNOSIS — F419 Anxiety disorder, unspecified: Secondary | ICD-10-CM

## 2022-09-12 DIAGNOSIS — M1990 Unspecified osteoarthritis, unspecified site: Secondary | ICD-10-CM | POA: Insufficient documentation

## 2022-09-12 DIAGNOSIS — F329 Major depressive disorder, single episode, unspecified: Secondary | ICD-10-CM | POA: Diagnosis not present

## 2022-09-12 DIAGNOSIS — E785 Hyperlipidemia, unspecified: Secondary | ICD-10-CM | POA: Diagnosis not present

## 2022-09-12 MED ORDER — ATORVASTATIN CALCIUM 20 MG PO TABS: 20.0000 mg | ORAL_TABLET | ORAL | 3 refills | Status: DC

## 2022-09-12 MED ORDER — MELOXICAM 7.5 MG PO TABS
7.5000 mg | ORAL_TABLET | Freq: Every day | ORAL | 1 refills | Status: DC
Start: 2022-09-12 — End: 2023-06-02

## 2022-09-12 MED ORDER — CITALOPRAM HYDROBROMIDE 20 MG PO TABS: 20.0000 mg | ORAL_TABLET | Freq: Every day | ORAL | 1 refills | Status: DC

## 2022-09-12 NOTE — Progress Notes (Signed)
[name], female  DOB: 11/18/1940, 82 y.o.   MRN: [id number] Patient Care Team    Relationship Specialty Notifications Start End  Natalia Leatherwood, DO PCP - General Family Medicine  03/03/17   Lyn Records, MD (Inactive) PCP - Cardiology Cardiology Admissions 05/07/17   Magrinat, Valentino Hue, MD (Inactive) Consulting Physician Oncology  03/03/17   Tanya Nones, OD  Optometry  03/03/17   Marcelle Overlie, MD Consulting Physician Obstetrics and Gynecology  03/03/17     Chief Complaint  Patient presents with   Depression    Would like to discuss changing statin    Subjective: Grace Fletcher is a 82 y.o.  Female  present for Depression: Patient reports compliance with Celexa 40 mg daily.  She states she is feeling okay. She wants to come down on the dose if possible.  Prior note: She feels overall she is coping okay after the death of her husband last year.  She states she started to get back out to church weekly and making lunch dates with friends.  Her son is staying with her and is taking good care of her.  Her and her son are walking a few times a week.  She was still have moments where she becomes very tearful. She is lonely sometimes.   Prior note: Many years ago when she was diagnosed with breast cancer. She had discontinued until her son had become incarcerated.  SHe has been on medication since.   She feels the medication has worked well for her at Celexa 40 mg daily.  Her son is now home and living with her and her husband.  Seems to be working out well for them and he is helping around the home.  Her husband unfortunately is in the hospital and has not been doing very well.  They are having a very difficult time keeping the fluid off of him despite high-dose diuretics.     09/12/2022    1:10 PM 04/02/2022    1:10 PM 03/28/2022    1:16 PM 03/28/2022    1:15 PM 10/11/2021    1:44 PM  Depression screen PHQ 2/9  Decreased Interest 1 0 0 0 1  Down, Depressed, Hopeless 1  0 1 1 0  PHQ - 2 Score 2 0 1 1 1   Altered sleeping 2 0 1  0  Tired, decreased energy 2 0 1  1  Change in appetite 0 0 1  1  Feeling bad or failure about yourself  0    1  Trouble concentrating 0 0 1  0  Moving slowly or fidgety/restless 0 0 0  0  Suicidal thoughts 0 0 0  0  PHQ-9 Score 6 0 5  4  Difficult doing work/chores Not difficult at all          09/12/2022    1:11 PM 03/28/2022    1:15 PM 10/11/2021    1:44 PM 06/15/2020    1:21 PM  GAD 7 : Generalized Anxiety Score  Nervous, Anxious, on Edge 1 1 0 2  Control/stop worrying 0 1 0 2  Worry too much - different things 0 0 0 2  Trouble relaxing 0 0 0 2  Restless 0 0 0 0  Easily annoyed or irritable 0 0 0 0  Afraid - awful might happen 0 0 0   Total GAD 7 Score 1 2 0   Anxiety Difficulty Not difficult at all  Immunization History  Administered Date(s) Administered   DTaP 04/13/2015   Fluad Quad(high Dose 65+) 10/11/2021, 09/12/2022   Influenza, High Dose Seasonal PF 11/27/2016, 11/19/2017, 12/26/2018, 12/26/2018, 11/26/2020   Influenza,inj,Quad PF,6+ Mos 11/28/2018   PFIZER(Purple Top)SARS-COV-2 Vaccination 02/08/2019, 03/07/2019, 04/01/2019, 11/24/2019   Pfizer Covid-19 Vaccine Bivalent Booster 22yrs & up 11/26/2020   Pneumococcal Conjugate-13 02/07/2019   Pneumococcal Polysaccharide-23 11/19/2017   Past Medical History:  Diagnosis Date   Anxiety 05/26/2013   Arthritis    Breast cancer, left breast (HCC) 2006   Lumpectomy, radiation and tamoxifen for 5 years.   Chronic systolic heart failure (HCC) 05/14/2016   Colon polyp    Cyst of pancreas    Depression 05/26/2013   Migraines    Moderate tricuspid regurgitation by prior echocardiogram 05/13/2010   Osteopenia 12/04/2017   Pneumonia due to COVID-19 virus    Pulmonary hypertension (HCC) 05/13/2010   Pulmonary stenosis 05/13/2010   Study Conclusions  - Left ventricle: The cavity size was normal. Systolic   function was mildly reduced. The estimated ejection   fraction  was in the range of 45% to 50%. There is   hypokinesis of the anteroseptal myocardium. Doppler   parameters are consistent with abnormal left ventricular   relaxation (grade 1 diastolic dysfunction). - Right ventricle: The cavity size was mildly dilated. Wall   thickness was normal. Systolic function was mildly   reduced. - Right atrium: The atrium was mildly dilated. - Atrial septum: No atrial level shunt, at baseline with   Doppler analysis. Prior ASD repair, CardioSeal 2004. - Pulmonic valve: Transvalvular velocity was increased, due   to mild degree of stenosis. Peak velocity: 224cm/s (S). - Pulmonary arteries: Systolic pressure was mildly   increased.     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 4 children.    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   Takes a daily vitamin.   Smoke alarm in the home.   Wears her seatbelt.   Feels safe in her relationships.    Allergies as of 09/12/2022   No Known Allergies      Medication List        Accurate as of September 12, 2022  1:35 PM. If you have any questions, ask your nurse or doctor.          STOP taking these medications    rosuvastatin 10 MG tablet Commonly known as: CRESTOR Stopped by: Felix Pacini       TAKE these medications    aspirin 81 MG tablet Take 81 mg by mouth daily.   atorvastatin 20 MG  tablet Commonly known as: LIPITOR Take 1 tablet (20 mg total) by mouth every other day. Started by: Felix Pacini   citalopram 20 MG tablet Commonly known as: CELEXA  Take 1 tablet (20 mg total) by mouth daily. What changed:  medication strength how much to take Changed by: Felix Pacini   meloxicam 7.5 MG tablet Commonly known as: MOBIC Take 1 tablet (7.5 mg total) by mouth daily. Started by: Felix Pacini   multivitamin capsule Take 1 capsule by mouth daily.   STOOL SOFTENER PO Take 1 capsule by mouth as needed.   Vitamin D 1000 units capsule Take 1,000 Units by mouth daily.        All past medical history, surgical history, allergies, family history, immunizations andmedications were updated in the EMR today and reviewed under the history and medication portions of their EMR.       ROS: 14 pt review of systems performed and negative (unless mentioned in an HPI)  Objective: BP 118/77   Pulse 62   Temp 98.1 F (36.7 C)   Wt 147 lb (66.7 kg)   SpO2 98%   BMI 25.23 kg/m  Physical Exam Vitals and nursing note reviewed.  Constitutional:      General: She is not in acute distress.    Appearance: Normal appearance. She is not ill-appearing, toxic-appearing or diaphoretic.  HENT:     Head: Normocephalic and atraumatic.  Eyes:     General: No scleral icterus.       Right eye: No discharge.        Left eye: No discharge.     Extraocular Movements: Extraocular movements intact.     Conjunctiva/sclera: Conjunctivae normal.     Pupils: Pupils are equal, round, and reactive to light.  Neurological:     Mental Status: She is alert and oriented to person, place, and time. Mental status is at baseline.     Motor: No weakness.     Gait: Gait normal.  Psychiatric:        Mood and Affect: Mood normal.        Behavior: Behavior normal.        Thought Content: Thought content normal.        Judgment: Judgment normal.    No results found.  Assessment/plan: Grace Fletcher is a 82 y.o. female present for Chronic Conditions/illness Management Depression/Anxiety stable decrease Celexa 20 mg daily. No longer using Ativan  Follow-up 3 months for CPE/CMC  Influenza vaccine needed Administered today  Hyperlipidemia LDL goal <100 Dc crestor- myalgia Start lipitor 20 mg every other day at night - Basic Metabolic Panel (BMET) - labs due next visit   Arthritis: Start mobic 7.5 mg w/ food.   Influenza vaccine administered today  Return in about 12 weeks (around 12/05/2022) for cpe (20 min), Routine chronic condition follow-up.  Orders Placed This Encounter  Procedures   Flu Vaccine QUAD High Dose(Fluad)   Basic Metabolic Panel (BMET)   Meds ordered this encounter  Medications   atorvastatin (LIPITOR) 20 MG tablet    Sig: Take 1 tablet (20 mg total) by mouth every other day.    Dispense:  45 tablet    Refill:  3   citalopram (CELEXA) 20 MG tablet    Sig: Take 1 tablet (20 mg total) by mouth daily.    Dispense:  90 tablet    Refill:  1   meloxicam (MOBIC) 7.5 MG tablet    Sig: Take 1 tablet (7.5 mg total) by mouth daily.    Dispense:  90 tablet    Refill:  1   Referral Orders  No referral(s) requested today     Electronically  signed by: Felix Pacini, DO [facility] [place]

## 2022-09-12 NOTE — Patient Instructions (Addendum)
Return in about 12 weeks (around 12/05/2022) for cpe (20 min), Routine chronic condition follow-up.        Great to see you today.  I have refilled the medication(s) we provide.   If labs were collected or images ordered, we will inform you of  results once we have received them and reviewed. We will contact you either by echart message, or telephone call.  Please give ample time to the testing facility, and our office to run,  receive and review results. Please do not call inquiring of results, even if you can see them in your chart. We will contact you as soon as we are able. If it has been over 1 week since the test was completed, and you have not yet heard from [place], then please call us.    - echart message- for normal results that have been seen by the patient already.   - telephone call: abnormal results or if patient has not viewed results in their echart.  If a referral to a specialist was entered for you, please call us in 2 weeks if you have not heard from the specialist office to schedule.

## 2022-09-13 LAB — BASIC METABOLIC PANEL
BUN: 15 mg/dL (ref 7–25)
CO2: 21 mmol/L (ref 20–32)
Calcium: 9.4 mg/dL (ref 8.6–10.4)
Chloride: 106 mmol/L (ref 98–110)
Creat: 0.72 mg/dL (ref 0.60–0.95)
Glucose, Bld: 94 mg/dL (ref 65–99)
Potassium: 4.2 mmol/L (ref 3.5–5.3)
Sodium: 138 mmol/L (ref 135–146)

## 2022-09-24 NOTE — Addendum Note (Signed)
Addended by: Filomena Jungling on: 09/24/2022 11:57 AM   Modules accepted: Orders

## 2022-09-30 ENCOUNTER — Encounter: Payer: Self-pay | Admitting: Family Medicine

## 2022-12-09 ENCOUNTER — Ambulatory Visit: Payer: Medicare Other | Admitting: Family Medicine

## 2022-12-09 ENCOUNTER — Encounter: Payer: Self-pay | Admitting: Family Medicine

## 2022-12-09 VITALS — BP 122/80 | HR 67 | Temp 97.6°F | Ht 63.0 in | Wt 151.4 lb

## 2022-12-09 DIAGNOSIS — F329 Major depressive disorder, single episode, unspecified: Secondary | ICD-10-CM

## 2022-12-09 DIAGNOSIS — Z1231 Encounter for screening mammogram for malignant neoplasm of breast: Secondary | ICD-10-CM | POA: Diagnosis not present

## 2022-12-09 DIAGNOSIS — Z0000 Encounter for general adult medical examination without abnormal findings: Secondary | ICD-10-CM

## 2022-12-09 DIAGNOSIS — C50912 Malignant neoplasm of unspecified site of left female breast: Secondary | ICD-10-CM

## 2022-12-09 DIAGNOSIS — I370 Nonrheumatic pulmonary valve stenosis: Secondary | ICD-10-CM

## 2022-12-09 DIAGNOSIS — I5022 Chronic systolic (congestive) heart failure: Secondary | ICD-10-CM

## 2022-12-09 DIAGNOSIS — M1990 Unspecified osteoarthritis, unspecified site: Secondary | ICD-10-CM

## 2022-12-09 DIAGNOSIS — F419 Anxiety disorder, unspecified: Secondary | ICD-10-CM

## 2022-12-09 DIAGNOSIS — H93A2 Pulsatile tinnitus, left ear: Secondary | ICD-10-CM

## 2022-12-09 DIAGNOSIS — E785 Hyperlipidemia, unspecified: Secondary | ICD-10-CM | POA: Diagnosis not present

## 2022-12-09 DIAGNOSIS — R7309 Other abnormal glucose: Secondary | ICD-10-CM | POA: Diagnosis not present

## 2022-12-09 DIAGNOSIS — I272 Pulmonary hypertension, unspecified: Secondary | ICD-10-CM

## 2022-12-09 DIAGNOSIS — R0989 Other specified symptoms and signs involving the circulatory and respiratory systems: Secondary | ICD-10-CM

## 2022-12-09 LAB — CBC
HCT: 44.7 % (ref 36.0–46.0)
Hemoglobin: 15 g/dL (ref 12.0–15.0)
MCHC: 33.6 g/dL (ref 30.0–36.0)
MCV: 98.3 fL (ref 78.0–100.0)
Platelets: 195 10*3/uL (ref 150.0–400.0)
RBC: 4.55 Mil/uL (ref 3.87–5.11)
RDW: 13.1 % (ref 11.5–15.5)
WBC: 5.8 10*3/uL (ref 4.0–10.5)

## 2022-12-09 LAB — LIPID PANEL
Cholesterol: 183 mg/dL (ref 0–200)
HDL: 74 mg/dL (ref >39.00)
LDL Cholesterol: 97 mg/dL (ref 0–99)
NonHDL: 109.09
Total CHOL/HDL Ratio: 2
Triglycerides: 60 mg/dL (ref 0.0–149.0)
VLDL: 12 mg/dL (ref 0.0–40.0)

## 2022-12-09 LAB — HEPATIC FUNCTION PANEL
ALT: 14 U/L (ref 0–35)
AST: 23 U/L (ref 0–37)
Albumin: 4.1 g/dL (ref 3.5–5.2)
Alkaline Phosphatase: 83 U/L (ref 39–117)
Bilirubin, Direct: 0.1 mg/dL (ref 0.0–0.3)
Total Bilirubin: 0.7 mg/dL (ref 0.2–1.2)
Total Protein: 6.6 g/dL (ref 6.0–8.3)

## 2022-12-09 LAB — TSH: TSH: 2.47 u[IU]/mL (ref 0.35–5.50)

## 2022-12-09 LAB — HEMOGLOBIN A1C: Hgb A1c MFr Bld: 6 % (ref 4.6–6.5)

## 2022-12-09 MED ORDER — ATORVASTATIN CALCIUM 20 MG PO TABS: 20.0000 mg | ORAL_TABLET | ORAL | 3 refills | Status: DC

## 2022-12-09 MED ORDER — CITALOPRAM HYDROBROMIDE 40 MG PO TABS
40.0000 mg | ORAL_TABLET | Freq: Every day | ORAL | 1 refills | Status: DC
Start: 2022-12-09 — End: 2023-04-08

## 2022-12-09 NOTE — Progress Notes (Signed)
[name], female  DOB: [birth year], 82 y.o.   MRN: [id number] Patient Care Team    Relationship Specialty Notifications Start End  Natalia Leatherwood, DO PCP - General Family Medicine  03/03/17   Lyn Records, MD (Inactive) PCP - Cardiology Cardiology Admissions 05/07/17   Magrinat, Valentino Hue, MD (Inactive) Consulting Physician Oncology  03/03/17   Tanya Nones, OD  Optometry  03/03/17   Marcelle Overlie, MD Consulting Physician Obstetrics and Gynecology  03/03/17     Chief Complaint  Patient presents with   Annual Exam    Subjective:  Grace Fletcher is a 82 y.o.  Female  present for CPE. All past medical history, surgical history, allergies, family history, immunizations, medications and social history were updated in the electronic medical record today. All recent labs, ED visits and hospitalizations within the last year were reviewed.  Health maintenance:  Mammogram: completed:03/10/2022, prior h/o left breast cancer (stage IIa) lumpectomy, radiation and tamoxifen for 5 years. [facility] [employer] [address].  Immunizations: tdap 03/28/2015, Influenza UTD 2024 (encouraged yearly), PNA completed. Shingrix declined DEXA:completed 2019 osteopenia -2.4- pt declined further imaging.  Assistive device: none Oxygen WUJ:WJXB Patient has a Dental home. 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  She is established with cardiology.  Pt takes daily baby ASA. Pt is  prescribed statin.Her last echo was 4/19. She had a recent appt with her cardiologist and they had told her to follow up if shortness of breath worsened. They offered her stress test and echo and she wanted to wait.  Patient reports she has noticed a swishing sound in her left ear.  She states it sounds like her  heartbeat in her left ear.  This has been occurring for approximately 1 month.   Depression: Patient reports she was started on Celexa many years ago when she was diagnosed with breast cancer. She had discontinued until her son had become incarcerated.  Recently she had wanted to decrease the Celexa to 20 mg.  She states since doing so she has noticed more depression days, feeling lonely.  She is tearful today.      09/12/2022    1:10 PM 04/02/2022    1:10 PM 03/28/2022    1:16 PM 03/28/2022    1:15 PM 10/11/2021    1:44 PM  Depression screen PHQ 2/9  Decreased Interest 1 0 0 0 1  Down, Depressed, Hopeless 1 0 1 1 0  PHQ - 2 Score 2 0 1 1 1   Altered sleeping 2 0 1  0  Tired, decreased energy 2 0 1  1  Change in appetite 0 0 1  1  Feeling bad or failure about yourself  0    1  Trouble concentrating 0 0 1  0  Moving slowly or fidgety/restless 0 0 0  0  Suicidal thoughts 0 0 0  0  PHQ-9 Score 6 0 5  4  Difficult doing work/chores Not difficult at all          09/12/2022    1:11 PM 03/28/2022    1:15 PM 10/11/2021    1:44 PM 06/15/2020    1:21 PM  GAD 7 : Generalized Anxiety Score  Nervous, Anxious, on Edge 1 1 0 2  Control/stop worrying 0 1 0 2  Worry too much - different things 0 0  0 2  Trouble relaxing 0 0 0 2  Restless 0 0 0 0  Easily annoyed or irritable 0 0 0 0  Afraid - awful might happen 0 0 0   Total GAD 7 Score 1 2 0   Anxiety Difficulty Not difficult at all      Immunization History  Administered Date(s) Administered   DTaP 04/13/2015   Fluad Quad(high Dose 65+) 10/11/2021   Fluad Trivalent(High Dose 65+) 09/12/2022   Influenza, High Dose Seasonal PF 11/27/2016, 11/19/2017, 12/26/2018, 12/26/2018, 11/26/2020   Influenza,inj,Quad PF,6+ Mos 11/28/2018   PFIZER(Purple Top)SARS-COV-2 Vaccination 02/08/2019, 03/07/2019, 04/01/2019, 11/24/2019   Pfizer Covid-19 Vaccine Bivalent Booster 57yrs & up 11/26/2020   Pneumococcal Conjugate-13 02/07/2019   Pneumococcal  Polysaccharide-23 11/19/2017   Past Medical History:  Diagnosis Date   Anxiety 05/26/2013   Arthritis    Breast cancer, left breast (HCC) 2006   Lumpectomy, radiation and tamoxifen for 5 years.   Chronic systolic heart failure (HCC) 05/14/2016   Colon polyp    Cyst of pancreas    Depression 05/26/2013   Migraines    Moderate tricuspid regurgitation by prior echocardiogram 05/13/2010   Osteopenia 12/04/2017   Pneumonia due to COVID-19 virus    Pulmonary hypertension (HCC) 05/13/2010   Pulmonary stenosis 05/13/2010   Study Conclusions  - Left ventricle: The cavity size was normal. Systolic   function was mildly reduced. The estimated ejection   fraction was in the range of 45% to 50%. There is   hypokinesis of the anteroseptal myocardium. Doppler   parameters are consistent with abnormal left ventricular   relaxation (grade 1 diastolic dysfunction). - Right ventricle: The cavity size was mildly dilated. Wall   thickness was normal. Systolic function was mildly   reduced. - Right atrium: The atrium was mildly dilated. - Atrial septum: No atrial level shunt, at baseline with   Doppler analysis. Prior ASD repair, CardioSeal 2004. - Pulmonic valve: Transvalvular velocity was increased, due   to mild degree of stenosis. Peak velocity: 224cm/s (S). - Pulmonary arteries: Systolic pressure was mildly   increased.     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   Card[facility]SEAL device, 2004; [facility]    No Known Allergies Past Surgical History:  Procedure Laterality Date   BREAST LUMPECTOMY     left breast cancer, status post lumpectomy, radiation and tamoxifen 5 years   CARPAL TUNNEL RELEASE     CESAREAN SECTION     x4   SENTINEL LYMPH NODE BIOPSY     Family History  Problem Relation Age of Onset   Dementia Mother    Arthritis Mother    Heart attack Father    Heart disease Father    Hypertension Sister    Heart attack Paternal Grandmother    Heart disease Paternal  Grandmother    Social History   Social History Narrative   Married. 4 children.    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   Takes a daily vitamin.   Smoke alarm in the home.   Wears her seatbelt.   Feels safe in her relationships.    Allergies as of 12/09/2022   No Known Allergies      Medication List        Accurate as of December 09, 2022 10:25 AM. If you have any questions, ask your nurse or doctor.          STOP taking these medications    STOOL SOFTENER PO Stopped by: Felix Pacini  TAKE these medications    aspirin 81 MG tablet Take 81 mg by mouth daily.   atorvastatin 20 MG tablet Commonly known as: LIPITOR Take 1 tablet (20 mg total) by mouth every other day.   citalopram 20 MG tablet Commonly known as: CELEXA Take 1 tablet (20 mg total) by mouth daily.   meloxicam 7.5 MG tablet Commonly known as: MOBIC Take 1 tablet (7.5 mg total) by mouth daily. What changed:  when to take this reasons to take this   multivitamin capsule Take 1 capsule by mouth daily.   Vitamin D 1000 units capsule Take 1,000 Units by mouth daily.        All past medical history, surgical history, allergies, family history, immunizations andmedications were updated in the EMR today and reviewed under the history and medication portions of their EMR.     ROS: 14 pt review of systems performed and negative (unless mentioned in an HPI)  Objective: BP 122/80   Pulse 67   Temp 97.6 F (36.4 C)   Ht 5\' 3"  (1.6 m)   Wt 151 lb 6.4 oz (68.7 kg)   SpO2 98%   BMI 26.82 kg/m  Physical Exam Vitals and nursing note reviewed.  Constitutional:      General: She is not in acute distress.    Appearance: Normal appearance. She is not ill-appearing or toxic-appearing.  HENT:     Head: Normocephalic and atraumatic.     Right Ear: Tympanic membrane, ear canal and external ear normal. There is no impacted cerumen.     Left Ear: Tympanic membrane, ear canal and external ear  normal. There is no impacted cerumen.     Nose: No congestion or rhinorrhea.     Mouth/Throat:     Mouth: Mucous membranes are moist.     Pharynx: Oropharynx is clear. No oropharyngeal exudate or posterior oropharyngeal erythema.  Eyes:     General: No scleral icterus.       Right eye: No discharge.        Left eye: No discharge.     Extraocular Movements: Extraocular movements intact.     Conjunctiva/sclera: Conjunctivae normal.     Pupils: Pupils are equal, round, and reactive to light.  Cardiovascular:     Rate and Rhythm: Normal rate and regular rhythm.     Pulses:          Carotid pulses are  on the left side with bruit.    Heart sounds: Murmur heard.     Systolic murmur is present with a grade of 2/6.     No friction rub. No gallop.  Pulmonary:     Effort: Pulmonary effort is normal. No respiratory distress.     Breath sounds: Normal breath sounds. No stridor. No wheezing, rhonchi or rales.  Chest:     Chest wall: No tenderness.  Abdominal:     General: Abdomen is flat. Bowel sounds are normal. There is no distension.     Palpations: Abdomen is soft. There is no mass.     Tenderness: There is no abdominal tenderness. There is no right CVA tenderness, left CVA tenderness, guarding or rebound.     Hernia: No hernia is present.  Musculoskeletal:        General: No swelling, tenderness or deformity. Normal range of motion.     Cervical back: Normal range of motion and neck supple. No rigidity or tenderness.     Right lower leg: No edema.  Left lower leg: No edema.  Lymphadenopathy:     Cervical: No cervical adenopathy.  Skin:    General: Skin is warm and dry.     Coloration: Skin is not jaundiced or pale.     Findings: No bruising, erythema, lesion or rash.  Neurological:     General: No focal deficit present.     Mental Status: She is alert and oriented to person, place, and time. Mental status is at baseline.     Cranial Nerves: No cranial nerve deficit.      Sensory: No sensory deficit.     Motor: No weakness.     Coordination: Coordination normal.     Gait: Gait normal.     Deep Tendon Reflexes: Reflexes normal.  Psychiatric:        Mood and Affect: Mood normal.        Behavior: Behavior normal.        Thought Content: Thought content normal.        Judgment: Judgment normal.     No results found.  Assessment/plan: Grace Fletcher is a 82 y.o. female present for CPE and Chronic Conditions/illness Management Breast cancer screening by mammogram - MM 3D SCREENING MAMMOGRAM BILATERAL BREAST; Future  Elevated hemoglobin A1c - Hemoglobin A1c  Bruit of left carotid artery/ Pulsatile tinnitus of left ear New problem, onset 1  month. No associated symptoms . - US Carotid Bilateral; Future  Depression, unspecified depression type/Anxiety Requires additional coverage. Increase back to Celexa 40 mg daily.  Chronic systolic heart failure (HCC)/hyperlipidemia/statin therapy/overweight/pulmonary hypertension Stable -Continue baby aspirin. Continue Lipitor, refills provided today.. - CBC - TSH - Lipid panel - Hepatic function panel  Osteopenia, unspecified location/edema D deficiency Continue vitamin D 1000 units daily.   Declined further bone density  Encounter for preventative health exam: Patient was encouraged to exercise greater than 150 minutes a week. Patient was encouraged to choose a diet filled with fresh fruits and vegetables, and lean meats. AVS provided to patient today for education/recommendation on gender specific health and safety maintenance. Mammogram: completed:03/10/2022, prior h/o left breast cancer (stage IIa) lumpectomy, radiation and tamoxifen for 5 years. [employer] [address]  Immunizations: tdap 03/28/2015, Influenza UTD 2024 (encouraged yearly), PNA completed. Shingrix declined DEXA:completed 2019 osteopenia -2.4- pt declined further imaging.   Return in about 24 weeks (around 05/26/2023) for Routine chronic  condition follow-up.  Orders Placed This Encounter  Procedures   CBC   Hemoglobin A1c   TSH   Lipid panel   Hepatic function panel   No orders of the defined types were placed in this encounter.  Referral Orders  No referral(s) requested today     Electronically signed by: Felix Pacini, DO [facility] [place]

## 2022-12-09 NOTE — Patient Instructions (Addendum)
Return in about 24 weeks (around 05/26/2023) for Routine chronic condition follow-up.   Senakot-S can be helpful to take if you have gone more than 3 days without bowel movement.      Great to see you today.  I have refilled the medication(s) we provide.   If labs were collected or images ordered, we will inform you of  results once we have received them and reviewed. We will contact you either by echart message, or telephone call.  Please give ample time to the testing facility, and our office to run,  receive and review results. Please do not call inquiring of results, even if you can see them in your chart. We will contact you as soon as we are able. If it has been over 1 week since the test was completed, and you have not yet heard from [place], then please call us.    - echart message- for normal results that have been seen by the patient already.   - telephone call: abnormal results or if patient has not viewed results in their echart.  If a referral to a specialist was entered for you, please call us in 2 weeks if you have not heard from the specialist office to schedule.

## 2023-01-15 ENCOUNTER — Telehealth: Payer: Self-pay

## 2023-01-15 NOTE — Telephone Encounter (Signed)
Please advise   Copied from CRM [id number]. Topic: Clinical - Medical Advice >> Jan 15, 2023 12:26 PM Grace Fletcher wrote: Reason for CRM: PT was on the phone with Dr. Blenda Bridegroom nurse but was disconnected- PT was seeking to find information on if Dr.Kuneff provides cortisone shots and if not, is there somewhere else PT can go? Or can she take the meloxicam (MOBIC) 7.5 MG tablet

## 2023-01-16 NOTE — Telephone Encounter (Signed)
I am not certain what patient is referring to.  Message seems to be only contain half of the patient's original message. What is she needing a cortisone shot for and why is she asking if she can take the meloxicam?

## 2023-01-16 NOTE — Telephone Encounter (Signed)
Pt has left thumb pain and wanted to see if you did the injections. She did take Meloxicam today and it seems to have eased it some

## 2023-01-16 NOTE — Telephone Encounter (Signed)
Yes, she certainly can take the meloxicam. If requiring a cortisone injection in her thumb, we typically refer to sports medicine

## 2023-01-16 NOTE — Telephone Encounter (Signed)
Left Vm for pt to return my call.  

## 2023-04-08 ENCOUNTER — Other Ambulatory Visit: Payer: Self-pay | Admitting: Family Medicine

## 2023-04-08 NOTE — Telephone Encounter (Signed)
 Copied from CRM [id number]. Topic: Clinical - Medication Refill >> Apr 08, 2023  3:14 PM Adele Barthel wrote: Most Recent Primary Care Visit:  Provider: Felix Pacini A  Department: [facility]  Visit Type: PHYSICAL  Date: 12/09/2022  Medication: atorvastatin (LIPITOR) 20 MG tablet, citalopram (CELEXA) 40 MG tablet.   [facility]. 1 Refill remaining for Celexa and 3 refills for Lipitor. No changes in dosage since prescription was provided.   Has the patient contacted their pharmacy? Yes (Agent: If no, request that the patient contact the pharmacy for the refill. If patient does not wish to contact the pharmacy document the reason why and proceed with request.) (Agent: If yes, when and what did the pharmacy advise?)  Is this the correct pharmacy for this prescription? Yes If no, delete pharmacy and type the correct one.  This is the patient's preferred pharmacy:   [id number] [place], [place] - [address] [place] [address] Phone: [phone] Fax: [phone]    Has the prescription been filled recently? Yes  Is the patient out of the medication? Yes  Has the patient been seen for an appointment in the last year OR does the patient have an upcoming appointment? Yes  Can we respond through MyChart? Yes  Agent: Please be advised that Rx refills may take up to 3 business days. We ask that you follow-up with your pharmacy.

## 2023-04-09 MED ORDER — CITALOPRAM HYDROBROMIDE 40 MG PO TABS: 40.0000 mg | ORAL_TABLET | Freq: Every day | ORAL | 0 refills | Status: DC

## 2023-04-09 MED ORDER — ATORVASTATIN CALCIUM 20 MG PO TABS: 20.0000 mg | ORAL_TABLET | ORAL | 0 refills | Status: DC

## 2023-05-03 ENCOUNTER — Encounter: Payer: Self-pay | Admitting: Cardiovascular Disease

## 2023-05-03 NOTE — Progress Notes (Unsigned)
  Cardiology Office Note:  .   Date:  05/04/2023  ID:  Grace Fletcher, DOB 05-28-1940, MRN [id number] PCP: Natalia Leatherwood, DO  [facility] Providers Cardiologist:  Lesleigh Noe, MD (Inactive) {   History of Present Illness: .    May 04, 2023   Grace Fletcher is a 83 y.o. female previously seen by Dr. Katrinka Blazing. I am meeting her for the first time today   She was last seen by Dr. 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 Grade I DD Normal RV  Mild MR Mild pulmonic stenosis  She has some DOE , ( walking into church yesterday )  No regular exercise regimine. Does lots of yard work.   Keeps her vegetable garden   Eats bacon once a month Drinks diet soda daily    ROS:   Studies Reviewed: Marland Kitchen   EKG Interpretation Date/Time:  May 15, 2023 May 04 2023 10:51:18 EDT Ventricular Rate:  66 PR Interval:  144 QRS Duration:  74 QT Interval:  396 QTC Calculation: 415 R Axis:   50  Text Interpretation: Normal sinus rhythm Nonspecific ST abnormality When compared with ECG of 05-Dec-2004 12:58, No significant change was found Confirmed by Kristeen Miss ([id number]) on 05/04/2023 10:52:48 AM     Risk Assessment/Calculations:             Physical Exam:   VS:  BP 138/80 (BP Location: Left Arm, Patient Position: Sitting, Cuff Size: Normal)   Pulse 66   Ht 5\' 3"  (1.6 m)   Wt 148 lb (67.1 kg)   SpO2 97%   BMI 26.22 kg/m    Wt Readings from Last 3 Encounters:  05/04/23 148 lb (67.1 kg)  12/09/22 151 lb 6.4 oz (68.7 kg)  09/12/22 147 lb (66.7 kg)    GEN: Well nourished, well developed in no acute distress NECK: No JVD; No carotid bruits CARDIAC: RR , 1-2 / 6 systolic murmur  RESPIRATORY:  Clear to  auscultation without rales, wheezing or rhonchi  ABDOMEN: Soft, non-tender, non-distended EXTREMITIES:  No edema; No deformity   ASSESSMENT AND PLAN: .   1.  Shortness of breath with exertion: She [name] is seen for follow-up of her history of congestive heart failure.  Last echocardiogram revealed normal left ventricular systolic function.  She does have mild pulmonary hypertension and mild pulmonic stenosis.  Will repeat her echocardiogram for further evaluation.  Advised her to try to increase her cardio exercise to see if she can increase her conditioning.  2.  Pulsatile tinnitus: She describes left-sided pulsatile tinnitus.  Will check a carotid duplex scan for further evaluation.       Dispo: 3-4 months   Signed, Kristeen Miss, MD

## 2023-05-04 ENCOUNTER — Encounter: Payer: Self-pay | Admitting: Student

## 2023-05-04 ENCOUNTER — Encounter: Payer: Self-pay | Admitting: Cardiovascular Disease

## 2023-05-04 ENCOUNTER — Ambulatory Visit: Payer: Medicare Other | Attending: Cardiovascular Disease | Admitting: Cardiovascular Disease

## 2023-05-04 VITALS — BP 138/80 | HR 66 | Ht 63.0 in | Wt 148.0 lb

## 2023-05-04 DIAGNOSIS — I700 Atherosclerosis of aorta: Secondary | ICD-10-CM | POA: Diagnosis not present

## 2023-05-04 DIAGNOSIS — I5022 Chronic systolic (congestive) heart failure: Secondary | ICD-10-CM | POA: Diagnosis not present

## 2023-05-04 DIAGNOSIS — I370 Nonrheumatic pulmonary valve stenosis: Secondary | ICD-10-CM

## 2023-05-04 DIAGNOSIS — I272 Pulmonary hypertension, unspecified: Secondary | ICD-10-CM

## 2023-05-04 DIAGNOSIS — E785 Hyperlipidemia, unspecified: Secondary | ICD-10-CM | POA: Diagnosis not present

## 2023-05-04 DIAGNOSIS — I071 Rheumatic tricuspid insufficiency: Secondary | ICD-10-CM

## 2023-05-04 NOTE — Patient Instructions (Signed)
 Medication Instructions:  Your physician recommends that you continue on your current medications as directed. Please refer to the Current Medication list given to you today. *If you need a refill on your cardiac medications before your next appointment, please call your pharmacy*  Testing/Procedures: Carotid Ultrasound Your physician has requested that you have a carotid duplex. This test is an ultrasound of the carotid arteries in your neck. It looks at blood flow through these arteries that supply the brain with blood. Allow one hour for this exam. There are no restrictions or special instructions.   Echocardiogram Your physician has requested that you have an echocardiogram. Echocardiography is a painless test that uses sound waves to create images of your heart. It provides your doctor with information about the size and shape of your heart and how well your heart's chambers and valves are working. This procedure takes approximately one hour. There are no restrictions for this procedure. Please do NOT wear cologne, perfume, aftershave, or lotions (deodorant is allowed). Please arrive 15 minutes prior to your appointment time.  Please note: We ask at that you not bring children with you during ultrasound (echo/ vascular) testing. Due to room size and safety concerns, children are not allowed in the ultrasound rooms during exams. Our front office staff cannot provide observation of children in our lobby area while testing is being conducted. An adult accompanying a patient to their appointment will only be allowed in the ultrasound room at the discretion of the ultrasound technician under special circumstances. We apologize for any inconvenience.   Follow-Up: At [facility], you and your health needs are our priority.  As part of our continuing mission to provide you with exceptional heart care, our providers are all part of one team.  This team includes your primary Cardiologist  (physician) and Advanced Practice Providers or APPs (Physician Assistants and Nurse Practitioners) who all work together to provide you with the care you need, when you need it.  Your next appointment:   3 - 4 month(s) - after testing has been completed  Provider:   Next Available General Cardiologist   We recommend signing up for the patient portal called "MyChart".  Sign up information is provided on this After Visit Summary.  MyChart is used to connect with patients for Virtual Visits (Telemedicine).  Patients are able to view lab/test results, encounter notes, upcoming appointments, etc.  Non-urgent messages can be sent to your provider as well.   To learn more about what you can do with MyChart, go to [email or web address].        1st Floor: - Lobby - Registration  - Pharmacy  - Lab - Cafe  2nd Floor: - PV Lab - Diagnostic Testing (echo, CT, nuclear med)  3rd Floor: - Vacant  4th Floor: - TCTS (cardiothoracic surgery) - AFib Clinic - Structural Heart Clinic - Vascular Surgery  - Vascular Ultrasound  5th Floor: - HeartCare Cardiology (general and EP) - Clinical Pharmacy for coumadin, hypertension, lipid, weight-loss medications, and med management appointments    Valet parking services will be available as well.

## 2023-06-02 ENCOUNTER — Encounter: Payer: Self-pay | Admitting: Family Medicine

## 2023-06-02 ENCOUNTER — Ambulatory Visit: Payer: Medicare Other | Admitting: Family Medicine

## 2023-06-02 VITALS — BP 118/76 | HR 60 | Temp 98.1°F | Wt 149.8 lb

## 2023-06-02 DIAGNOSIS — F419 Anxiety disorder, unspecified: Secondary | ICD-10-CM

## 2023-06-02 DIAGNOSIS — I272 Pulmonary hypertension, unspecified: Secondary | ICD-10-CM

## 2023-06-02 DIAGNOSIS — M8589 Other specified disorders of bone density and structure, multiple sites: Secondary | ICD-10-CM | POA: Diagnosis not present

## 2023-06-02 DIAGNOSIS — I5022 Chronic systolic (congestive) heart failure: Secondary | ICD-10-CM | POA: Diagnosis not present

## 2023-06-02 DIAGNOSIS — F329 Major depressive disorder, single episode, unspecified: Secondary | ICD-10-CM

## 2023-06-02 DIAGNOSIS — E785 Hyperlipidemia, unspecified: Secondary | ICD-10-CM

## 2023-06-02 MED ORDER — MELOXICAM 7.5 MG PO TABS: 7.5000 mg | ORAL_TABLET | ORAL | 1 refills | Status: DC | PRN

## 2023-06-02 MED ORDER — ATORVASTATIN CALCIUM 20 MG PO TABS: 20.0000 mg | ORAL_TABLET | ORAL | 3 refills | Status: DC

## 2023-06-02 MED ORDER — CITALOPRAM HYDROBROMIDE 40 MG PO TABS
40.0000 mg | ORAL_TABLET | Freq: Every day | ORAL | 1 refills | Status: DC
Start: 2023-06-02 — End: 2023-12-09

## 2023-06-02 NOTE — Progress Notes (Signed)
 [name], female  DOB: Feb 05, 1940, 83 y.o.   MRN: [id number] Patient Care Team    Relationship Specialty Notifications Start End  Mariel Shope, DO PCP - General Family Medicine  03/03/17   Arty Binning, MD (Inactive) PCP - Cardiology Cardiology Admissions 05/07/17   Magrinat, Rozella Cornfield, MD (Inactive) Consulting Physician Oncology  03/03/17   Verta Goring, OD  Optometry  03/03/17   Thurman Flores, MD Consulting Physician Obstetrics and Gynecology  03/03/17     Chief Complaint  Patient presents with   Depression    Subjective:  Grace Fletcher is a 83 y.o.  Female  present for Chronic Conditions/illness Management  All past medical history, surgical history, allergies, family history, immunizations, medications and social history were updated in the electronic medical record today. All recent labs, ED visits and hospitalizations within the last year were reviewed.  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  She is established with cardiology.  Pt takes daily baby ASA. Pt is  prescribed statin.Her last echo was 4/19. She had a recent appt with her cardiologist and they had told her to follow up if shortness of breath worsened.  She has est with new cardio and has upcoming carotid studies and echo.   Depression: Patient reports she was started on Celexa  many years ago when she was diagnosed with breast cancer.  She had discontinued until her son had become incarcerated.   Recently she had wanted to decrease the Celexa  to 20 mg.  She states since doing so she has noticed more depression days, feeling lonely.   Today she reports celexa  40 mg every day is helping      06/02/2023   10:24 AM 09/12/2022    1:10 PM 04/02/2022    1:10 PM 03/28/2022    1:16 PM 03/28/2022    1:15 PM  Depression  screen PHQ 2/9  Decreased Interest 1 1 0 0 0  Down, Depressed, Hopeless 0 1 0 1 1  PHQ - 2 Score 1 2 0 1 1  Altered sleeping 1 2 0 1   Tired, decreased energy 1 2 0 1   Change in appetite 1 0 0 1   Feeling bad or failure about yourself  0 0     Trouble concentrating 0 0 0 1   Moving slowly or fidgety/restless 0 0 0 0   Suicidal thoughts 0 0 0 0   PHQ-9 Score 4 6 0 5   Difficult doing work/chores Not difficult at all Not difficult at all         06/02/2023   10:24 AM 09/12/2022    1:11 PM 03/28/2022    1:15 PM 10/11/2021    1:44 PM  GAD 7 : Generalized Anxiety Score  Nervous, Anxious, on Edge 1 1 1  0  Control/stop worrying 0 0 1 0  Worry too much - different things 0 0 0 0  Trouble relaxing 0 0 0 0  Restless 0 0 0 0  Easily annoyed or irritable 0 0 0 0  Afraid - awful might happen 0 0 0 0  Total GAD 7 Score 1 1 2  0  Anxiety Difficulty Not difficult at all Not difficult at all     Immunization History  Administered Date(s) Administered   DTaP 04/13/2015   Fluad Quad(high Dose 65+) 10/11/2021   Fluad  Trivalent(High Dose 65+) 09/12/2022   Influenza, High Dose Seasonal PF 11/27/2016, 11/19/2017, 12/26/2018, 12/26/2018, 11/26/2020   Influenza,inj,Quad PF,6+ Mos 11/28/2018   PFIZER(Purple Top)SARS-COV-2 Vaccination 02/08/2019, 03/07/2019, 04/01/2019, 11/24/2019   Pfizer Covid-19 Vaccine Bivalent Booster 19yrs & up 11/26/2020   Pneumococcal Conjugate-13 02/07/2019   Pneumococcal Polysaccharide-23 11/19/2017   Past Medical History:  Diagnosis Date   Anxiety 05/26/2013   Arthritis    Breast cancer, left breast (HCC) 2006   Lumpectomy, radiation and tamoxifen for 5 years.   Chronic systolic heart failure (HCC) 05/14/2016   Colon polyp    Cyst of pancreas    Depression 05/26/2013   Migraines    Moderate tricuspid regurgitation by prior echocardiogram 05/13/2010   Osteopenia 12/04/2017   Pneumonia due to COVID-19 virus    Pulmonary hypertension (HCC) 05/13/2010   Pulmonary stenosis  05/13/2010   Study Conclusions  - Left ventricle: The cavity size was normal. Systolic   function was mildly reduced. The estimated ejection   fraction was in the range of 45% to 50%. There is   hypokinesis of the anteroseptal myocardium. Doppler   parameters are consistent with abnormal left ventricular   relaxation (grade 1 diastolic dysfunction). - Right ventricle: The cavity size was mildly dilated. Wall   thickness was normal. Systolic function was mildly   reduced. - Right atrium: The atrium was mildly dilated. - Atrial septum: No atrial level shunt, at baseline with   Doppler analysis. Prior ASD repair, CardioSeal 2004. - Pulmonic valve: Transvalvular velocity was increased, due   to mild degree of stenosis. Peak velocity: 224cm/s (S). - Pulmonary arteries: Systolic pressure was mildly   increased.     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   CardioSEAL device, 2004; [facility]    No Known Allergies Past Surgical History:  Procedure Laterality Date   BREAST LUMPECTOMY     left breast cancer, status post lumpectomy, radiation and tamoxifen 5 years   CARPAL TUNNEL RELEASE     CESAREAN SECTION     x4   SENTINEL LYMPH NODE BIOPSY     Family History  Problem Relation Age of Onset   Dementia Mother    Arthritis Mother    Heart attack Father    Heart disease Father    Hypertension Sister    Heart attack Paternal Grandmother    Heart disease Paternal Grandmother    Social History   Social History Narrative   Married. 4 children.    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   Takes a daily vitamin.   Smoke alarm in the home.   Wears her seatbelt.   Feels safe in her relationships.    Allergies as of 06/02/2023   No Known Allergies      Medication List        Accurate as of Jun 02, 2023 10:33 AM. If you have any questions, ask your nurse or doctor.          aspirin 81 MG tablet Take 81 mg by mouth daily.   atorvastatin  20 MG tablet Commonly  known as: LIPITOR Take 1 tablet (20 mg total) by mouth every other day.   citalopram  40 MG tablet Commonly known as: CELEXA  Take 1 tablet (40 mg total) by mouth daily.   meloxicam  7.5 MG tablet Commonly known as: MOBIC  Take 1 tablet (7.5 mg total) by mouth as needed for pain.   multivitamin capsule Take 1 capsule by mouth daily.   Vitamin D  1000 units capsule Take 1,000  Units by mouth daily.        All past medical history, surgical history, allergies, family history, immunizations andmedications were updated in the EMR today and reviewed under the history and medication portions of their EMR.     ROS: 14 pt review of systems performed and negative (unless mentioned in an HPI)  Objective: BP 118/76   Pulse 60   Temp 98.1 F (36.7 C)   Wt 149 lb 12.8 oz (67.9 kg)   SpO2 97%   BMI 26.54 kg/m  Physical Exam Vitals and nursing note reviewed.  Constitutional:      General: She is not in acute distress.    Appearance: Normal appearance. She is not ill-appearing, toxic-appearing or diaphoretic.  HENT:     Head: Normocephalic and atraumatic.  Eyes:     General: No scleral icterus.       Right eye: No discharge.        Left eye: No discharge.     Extraocular Movements: Extraocular movements intact.     Conjunctiva/sclera: Conjunctivae normal.     Pupils: Pupils are equal, round, and reactive to light.  Cardiovascular:     Rate and Rhythm: Normal rate and regular rhythm.     Heart sounds: Murmur heard.  Pulmonary:     Effort: Pulmonary effort is normal. No respiratory distress.     Breath sounds: Normal breath sounds. No wheezing, rhonchi or rales.  Musculoskeletal:     Right lower leg: No edema.     Left lower leg: No edema.  Skin:    General: Skin is warm.     Findings: No rash.  Neurological:     Mental Status: She is alert and oriented to person, place, and time. Mental status is at baseline.     Motor: No weakness.     Gait: Gait normal.  Psychiatric:         Mood and Affect: Mood normal.        Behavior: Behavior normal.        Thought Content: Thought content normal.        Judgment: Judgment normal.     No results found.  Assessment/plan: Grace Fletcher is a 83 y.o. female present for Chronic Conditions/illness Management Depression, unspecified depression type/Anxiety stable Continue Celexa  40 mg daily.  Chronic systolic heart failure (HCC)/hyperlipidemia/statin therapy/overweight/pulmonary hypertension Stable -Continue baby aspirin. Continue Lipitor, refills provided today.. Labs due next visit.   Osteopenia, unspecified location/edema D deficiency continue vitamin D  1000 units daily.   Declined further bone density     Return in about 6 months (around 12/10/2023) for cpe (20 min), Routine chronic condition follow-up.  No orders of the defined types were placed in this encounter.  Meds ordered this encounter  Medications   atorvastatin  (LIPITOR) 20 MG tablet    Sig: Take 1 tablet (20 mg total) by mouth every other day.    Dispense:  45 tablet    Refill:  3   citalopram  (CELEXA ) 40 MG tablet    Sig: Take 1 tablet (40 mg total) by mouth daily.    Dispense:  90 tablet    Refill:  1   meloxicam  (MOBIC ) 7.5 MG tablet    Sig: Take 1 tablet (7.5 mg total) by mouth as needed for pain.    Dispense:  90 tablet    Refill:  1    Hold until pt request   Referral Orders  No referral(s) requested today     Electronically  signed by: Napolean Backbone, DO  [facility]- [place]

## 2023-06-02 NOTE — Patient Instructions (Addendum)

## 2023-06-09 ENCOUNTER — Ambulatory Visit (HOSPITAL_COMMUNITY)
Admission: RE | Admit: 2023-06-09 | Discharge: 2023-06-09 | Disposition: A | Discharge status: Home / Self Care | Source: Ambulatory Visit | Attending: Cardiovascular Disease | Admitting: Cardiovascular Disease

## 2023-06-09 ENCOUNTER — Ambulatory Visit (HOSPITAL_BASED_OUTPATIENT_CLINIC_OR_DEPARTMENT_OTHER)
Admission: RE | Admit: 2023-06-09 | Discharge: 2023-06-09 | Disposition: A | Discharge status: Home / Self Care | Source: Ambulatory Visit | Attending: Cardiovascular Disease | Admitting: Cardiovascular Disease

## 2023-06-09 DIAGNOSIS — I5022 Chronic systolic (congestive) heart failure: Secondary | ICD-10-CM | POA: Diagnosis not present

## 2023-06-09 DIAGNOSIS — E785 Hyperlipidemia, unspecified: Secondary | ICD-10-CM

## 2023-06-09 DIAGNOSIS — I370 Nonrheumatic pulmonary valve stenosis: Secondary | ICD-10-CM | POA: Diagnosis not present

## 2023-06-09 DIAGNOSIS — I700 Atherosclerosis of aorta: Secondary | ICD-10-CM

## 2023-06-09 DIAGNOSIS — I340 Nonrheumatic mitral (valve) insufficiency: Secondary | ICD-10-CM

## 2023-06-09 DIAGNOSIS — I071 Rheumatic tricuspid insufficiency: Secondary | ICD-10-CM | POA: Diagnosis present

## 2023-06-09 DIAGNOSIS — I272 Pulmonary hypertension, unspecified: Secondary | ICD-10-CM | POA: Diagnosis present

## 2023-06-09 LAB — ECHOCARDIOGRAM COMPLETE
AR max vel: 1.78 cm2
AV Area VTI: 1.72 cm2
AV Area mean vel: 1.65 cm2
AV Mean grad: 2 mmHg
AV Peak grad: 4.6 mmHg
Ao pk vel: 1.07 m/s
Area-P 1/2: 3.02 cm2
S' Lateral: 3.3 cm

## 2023-06-10 ENCOUNTER — Other Ambulatory Visit: Payer: Self-pay

## 2023-06-10 ENCOUNTER — Ambulatory Visit: Payer: Self-pay | Admitting: Cardiovascular Disease

## 2023-06-10 DIAGNOSIS — I071 Rheumatic tricuspid insufficiency: Secondary | ICD-10-CM

## 2023-06-10 DIAGNOSIS — I272 Pulmonary hypertension, unspecified: Secondary | ICD-10-CM

## 2023-06-10 MED ORDER — SPIRONOLACTONE 25 MG PO TABS: 25.0000 mg | ORAL_TABLET | Freq: Every day | ORAL | 3 refills | Status: DC

## 2023-06-10 NOTE — Telephone Encounter (Signed)
 Called and spoke with patient who agrees to plan. Spironolactone sent to pharmacy on file and repeat labs entered. She verbalized understanding to go to [facility] in 1-2 weeks to have done. Already scheduled with [facility] for August of this year.

## 2023-06-10 NOTE — Telephone Encounter (Signed)
-----   Message from Ahmad Alert sent at 06/10/2023  9:18 AM EDT ----- Low normal LV systlic function.   Grade II DD  RV function is low normal . Moderate pulmonary HTN.   Estimated PA pressure is 47 mmHg Mild - mod MR Moderate TR  There is an  Atrial septal occluder device in place with no residual shunt  Lets try spironolactone 25 mg a day to see if it helps her moderate pulmonary HTN. Check BMP in 1-2 weeks  Follow up as scheduled later this summer with one of my partners.

## 2023-06-26 LAB — BASIC METABOLIC PANEL WITH GFR
BUN/Creatinine Ratio: 15 (ref 12–28)
BUN: 12 mg/dL (ref 8–27)
CO2: 22 mmol/L (ref 20–29)
Calcium: 9.7 mg/dL (ref 8.7–10.3)
Chloride: 101 mmol/L (ref 96–106)
Creatinine, Ser: 0.78 mg/dL (ref 0.57–1.00)
Glucose: 92 mg/dL (ref 70–99)
Potassium: 4.9 mmol/L (ref 3.5–5.2)
Sodium: 139 mmol/L (ref 134–144)
eGFR: 76 mL/min/{1.73_m2} (ref >59)

## 2023-06-29 ENCOUNTER — Ambulatory Visit: Payer: Self-pay | Admitting: Cardiovascular Disease

## 2023-09-03 ENCOUNTER — Ambulatory Visit: Attending: Cardiology | Admitting: Cardiology

## 2023-09-03 ENCOUNTER — Encounter: Payer: Self-pay | Admitting: Cardiology

## 2023-09-03 VITALS — BP 146/81 | HR 64 | Ht 63.0 in | Wt 145.0 lb

## 2023-09-03 DIAGNOSIS — I071 Rheumatic tricuspid insufficiency: Secondary | ICD-10-CM

## 2023-09-03 DIAGNOSIS — I700 Atherosclerosis of aorta: Secondary | ICD-10-CM

## 2023-09-03 NOTE — Progress Notes (Signed)
 Cardiology Office Note:  .   Date:  09/03/2023  ID:  Grace Fletcher, DOB [birth year], MRN [id number] PCP: Catherine Charlies LABOR, DO  [facility] Providers Cardiologist:  Oneil Parchment, MD     History of Present Illness: .   Grace Fletcher is a 83 y.o. female Discussed the use of AI scribe software for clinical note transcription with the patient, who gave verbal consent to proceed.  History of Present Illness Grace Fletcher is an 83 year old female with chronic systolic heart failure who presents for follow-up.  She has a history of percutaneous atrial septal defect repair and chronic systolic heart failure with an ejection fraction of 45% prior to her husband's passing in 2022. She discontinued her cardiac medications, including metoprolol  and losartan , due to fatigue and low blood pressure. A follow-up echocardiogram in May 2023 showed a normal ejection fraction of 60% with mild mitral regurgitation and mild pulmonic stenosis. A repeat echocardiogram on Jun 09, 2023, showed an ejection fraction of 50-55%, pulmonary pressure of 47 mmHg, a well-seated occluder, mild dilation of the ascending aorta at 41 mm, trivial aortic insufficiency, moderate tricuspid regurgitation, and mild to moderate mitral valve regurgitation.  She experiences pulsatile tinnitus and some shortness of breath with exertion. She was previously prescribed spironolactone  but discontinued it due to severe constipation and fatigue, which she attributes to dehydration. She does not experience swelling and questions the necessity of a diuretic. She has a history of low blood pressure, typically around 101/60 mmHg, and experiences dizziness upon standing and occasionally while in stores.  Her past medical history includes pulmonic stenosis and a repaired atrial septal defect. She also mentions a history of breast cancer, for which she avoids using her left arm for blood pressure measurements. Her husband passed away from heart  failure, and she cared for him at home during his final days.  She remains active, maintaining a garden and pool, and engages in physical activities such as mowing the lawn and cleaning up yard debris. She acknowledges the importance of staying hydrated, especially during physical exertion in the heat.     Studies Reviewed: .        Results Echocardiogram: Low normal systolic function, EF 50-55%, pulmonary pressure 47 mmHg, well-seated occluder, mild dilation of ascending aorta 41 mm, trivial aortic insufficiency, moderate tricuspid regurgitation, mild to moderate mitral regurgitation (06/09/2023) Risk Assessment/Calculations:           Physical Exam:   VS:  BP (!) 146/81   Pulse 64   Ht 5' 3 (1.6 m)   Wt 145 lb (65.8 kg)   SpO2 98%   BMI 25.69 kg/m    Wt Readings from Last 3 Encounters:  09/03/23 145 lb (65.8 kg)  06/02/23 149 lb 12.8 oz (67.9 kg)  05/04/23 148 lb (67.1 kg)    GEN: Well nourished, well developed in no acute distress NECK: No JVD; No carotid bruits CARDIAC: RRR, soft systolic murmur, no rubs, no gallops RESPIRATORY:  Clear to auscultation without rales, wheezing or rhonchi  ABDOMEN: Soft, non-tender, non-distended EXTREMITIES:  No edema; No deformity   ASSESSMENT AND PLAN: .    Assessment and Plan Assessment & Plan Low normal ejection fraction Low normal ejection fraction of 50-55%. Asymptomatic with no signs of fluid overload. Previous medications, including spironolactone , metoprolol , and losartan , were discontinued due to side effects such as fatigue and hypotension. Current management focuses on lifestyle modifications. - Encourage regular physical activity, such as walking and gardening,  to maintain cardiovascular health. - Advise maintaining a Mediterranean diet. - Encourage adequate hydration, especially during hot weather, to prevent dehydration.  Status post percutaneous atrial septal defect repair Status post successful percutaneous atrial  septal defect repair with a well-seated occluder. No current issues related to the repair.  Mild mitral regurgitation and mild to moderate tricuspid regurgitation Mild mitral regurgitation and mild to moderate tricuspid regurgitation noted on echocardiogram. Not severe enough to warrant surgical intervention. Management focuses on monitoring and maintaining overall cardiovascular health. - Encourage regular physical activity and a healthy diet to support cardiovascular health.  Mild pulmonic stenosis Mild pulmonic stenosis, long-standing without significant progression. No surgical intervention required.  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 Monitoring is advised.  Trivial aortic insufficiency Trivial aortic insufficiency noted on echocardiogram.  Exertional dyspnea Exertional dyspnea likely related to underlying cardiac conditions and age. Managed through lifestyle modifications. - Encourage regular physical activity within tolerance to improve cardiovascular fitness.  Orthostatic hypotension Orthostatic hypotension with episodes of dizziness upon standing, likely exacerbated by dehydration and previous medication use. Managed through lifestyle modifications. - Encourage adequate hydration to prevent dehydration. - Monitor blood pressure regularly.         Dispo: 1 yr  Signed, Oneil Parchment, MD

## 2023-09-03 NOTE — Patient Instructions (Signed)
 Medication Instructions:  The current medical regimen is effective;  continue present plan and medications.  *If you need a refill on your cardiac medications before your next appointment, please call your pharmacy*  Follow-Up: At [facility], you and your health needs are our priority.  As part of our continuing mission to provide you with exceptional heart care, our providers are all part of one team.  This team includes your primary Cardiologist (physician) and Advanced Practice Providers or APPs (Physician Assistants and Nurse Practitioners) who all work together to provide you with the care you need, when you need it.  Your next appointment:   1 year(s)  Provider:   Dr Dorothye Gathers     We recommend signing up for the patient portal called "MyChart".  Sign up information is provided on this After Visit Summary.  MyChart is used to connect with patients for Virtual Visits (Telemedicine).  Patients are able to view lab/test results, encounter notes, upcoming appointments, etc.  Non-urgent messages can be sent to your provider as well.   To learn more about what you can do with MyChart, go to [email or web address].

## 2023-12-09 ENCOUNTER — Ambulatory Visit (INDEPENDENT_AMBULATORY_CARE_PROVIDER_SITE_OTHER)

## 2023-12-09 VITALS — BP 117/72 | HR 74 | Ht 63.0 in | Wt 145.0 lb

## 2023-12-09 DIAGNOSIS — Z0000 Encounter for general adult medical examination without abnormal findings: Secondary | ICD-10-CM | POA: Diagnosis not present

## 2023-12-09 NOTE — Progress Notes (Signed)
 Chief Complaint  Patient presents with   Medicare Wellness     Subjective:   Grace Fletcher is a 83 y.o. female who presents for a Medicare Annual Wellness Visit.  Allergies (verified) Patient has no known allergies.   History: Past Medical History:  Diagnosis Date   Anxiety 05/26/2013   Arthritis    Breast cancer, left breast (HCC) 2006   Lumpectomy, radiation and tamoxifen for 5 years.   Chronic systolic heart failure (HCC) 05/14/2016   Colon polyp    Cyst of pancreas    Depression 05/26/2013   Migraines    Moderate tricuspid regurgitation by prior echocardiogram 05/13/2010   Osteopenia 12/04/2017   Pneumonia due to COVID-19 virus    Pulmonary hypertension (HCC) 05/13/2010   Pulmonary stenosis 05/13/2010   Study Conclusions  - Left ventricle: The cavity size was normal. Systolic   function was mildly reduced. The estimated ejection   fraction was in the range of 45% to 50%. There is   hypokinesis of the anteroseptal myocardium. Doppler   parameters are consistent with abnormal left ventricular   relaxation (grade 1 diastolic dysfunction). - Right ventricle: The cavity size was mildly dilated. Wall   thickness was normal. Systolic function was mildly   reduced. - Right atrium: The atrium was mildly dilated. - Atrial septum: No atrial level shunt, at baseline with   Doppler analysis. Prior ASD repair, CardioSeal 2004. - Pulmonic valve: Transvalvular velocity was increased, due   to mild degree of stenosis. Peak velocity: 224cm/s (S). - Pulmonary arteries: Systolic pressure was mildly   increased.     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   CardioSEAL device, 2004; [facility]    Past Surgical History:  Procedure Laterality Date   BREAST LUMPECTOMY     left breast cancer, status post lumpectomy, radiation and tamoxifen 5 years   CARPAL TUNNEL RELEASE     CESAREAN SECTION     x4   SENTINEL LYMPH NODE BIOPSY     Family History  Problem Relation  Age of Onset   Dementia Mother    Arthritis Mother    Heart attack Father    Heart disease Father    Hypertension Sister    Heart attack Paternal Grandmother    Heart disease Paternal Grandmother    Social History   Occupational History   Occupation: retired  Tobacco Use   Smoking status: Former   Smokeless tobacco: Never  Advertising Account Planner   Vaping status: Never Used  Substance and Sexual Activity   Alcohol use: No   Drug use: No   Sexual activity: Yes    Partners: Male    Birth control/protection: Post-menopausal   Tobacco Counseling Counseling given: Not Answered  SDOH Screenings   Food Insecurity: No Food Insecurity (12/09/2023)  Housing: Unknown (12/09/2023)  Transportation Needs: No Transportation Needs (12/09/2023)  Utilities: Not At Risk (12/09/2023)  Alcohol Screen: Low Risk  (10/11/2021)  Depression (PHQ2-9): Low Risk  (12/09/2023)  Financial Resource Strain: Low Risk  (04/02/2022)  Physical Activity: Inactive (12/09/2023)  Social Connections: Moderately Integrated (12/09/2023)  Stress: No Stress Concern Present (12/09/2023)  Tobacco Use: Medium Risk (12/09/2023)  Health Literacy: Adequate Health Literacy (12/09/2023)   Depression Screen    12/09/2023   11:08 AM 06/02/2023   10:24 AM 09/12/2022    1:10 PM 04/02/2022    1:10 PM 03/28/2022    1:16 PM 03/28/2022    1:15 PM 10/11/2021    1:44 PM  PHQ 2/9 Scores  PHQ - 2 Score 0 1 2 0 1 1 1   PHQ- 9 Score 0 4  6  0  5   4      Data saved with a previous flowsheet row definition     Goals Addressed             This Visit's Progress    Patient Stated   On track    Stay healthy        Visit info / Clinical Intake: Medicare Wellness Visit Type:: Subsequent Annual Wellness Visit Persons participating in visit:: patient Medicare Wellness Visit Mode:: Video Because this visit was a virtual/telehealth visit:: vitals recorded from last visit (Bp-per pt) If Telephone or Video please confirm:: I connected with the  patient using audio enabled telemedicine application and verified that I am speaking with the correct person using two identifiers; I discussed the limitations of evaluation and management by telemedicine; The patient expressed understanding and agreed to proceed Patient Location:: Home Provider Location:: Home Information given by:: patient Interpreter Needed?: No Pre-visit prep was completed: no AWV questionnaire completed by patient prior to visit?: no Living arrangements:: with family/others Patient's Overall Health Status Rating: very good Typical amount of pain: some Does pain affect daily life?: no Are you currently prescribed opioids?: no  Dietary Habits and Nutritional Risks How many meals a day?: (!) 1 Eats fruit and vegetables daily?: (!) no (just vetgetables) Most meals are obtained by: preparing own meals In the last 2 weeks, have you had any of the following?: none Diabetic:: no  Functional Status Activities of Daily Living (to include ambulation/medication): Independent Ambulation: Independent with device- listed below Home Assistive Devices/Equipment: Eyeglasses Medication Administration: Independent Home Management: Independent Manage your own finances?: yes Primary transportation is: driving Concerns about vision?: no *vision screening is required for WTM* Concerns about hearing?: no  Fall Screening Falls in the past year?: 0 Number of falls in past year: 0 Was there an injury with Fall?: 0 Fall Risk Category Calculator: 0 Patient Fall Risk Level: Low Fall Risk  Fall Risk Patient at Risk for Falls Due to: No Fall Risks Fall risk Follow up: Falls evaluation completed; Falls prevention discussed  Home and Transportation Safety: All rugs have non-skid backing?: yes All stairs or steps have railings?: yes (2 story home) Grab bars in the bathtub or shower?: yes Have non-skid surface in bathtub or shower?: yes Good home lighting?: yes Regular seat belt  use?: yes Hospital stays in the last year:: no  Cognitive Assessment Difficulty concentrating, remembering, or making decisions? : no Will 6CIT or Mini Cog be Completed: no 6CIT or Mini Cog Declined: patient alert, oriented, able to answer questions appropriately and recall recent events  Advance Directives (For Healthcare) Does Patient Have a Medical Advance Directive?: No Would patient like information on creating a medical advance directive?: No - Patient declined  Reviewed/Updated  Reviewed/Updated: Reviewed All (Medical, Surgical, Family, Medications, Allergies, Care Teams, Patient Goals)        Objective:    Today's Vitals   12/09/23 1045  BP: 117/72  Pulse: 74  Weight: 145 lb (65.8 kg)  Height: 5' 3 (1.6 m)   Body mass index is 25.69 kg/m.  Current Medications (verified) Outpatient Encounter Medications as of 12/09/2023  Medication Sig   aspirin 81 MG tablet Take 81 mg by mouth daily.   Cholecalciferol (VITAMIN D ) 1000 UNITS capsule Take 1,000 Units by mouth daily.   citalopram  (CELEXA ) 40 MG tablet Take  1 tablet (40 mg total) by mouth daily.   docusate sodium (COLACE) 100 MG capsule Take 100 mg by mouth daily.   Multiple Vitamin (MULTIVITAMIN) capsule Take 1 capsule by mouth daily.   atorvastatin  (LIPITOR) 20 MG tablet Take 1 tablet (20 mg total) by mouth every other day. (Patient not taking: Reported on 12/09/2023)   No facility-administered encounter medications on file as of 12/09/2023.   Hearing/Vision screen Hearing Screening - Comments:: Denies hearing difficulties   Vision Screening - Comments:: Wears eyeglasses/[facility]/UTD Immunizations and Health Maintenance Health Maintenance  Topic Date Due   Mammogram  03/11/2023   Influenza Vaccine  08/28/2023   Medicare Annual Wellness (AWV)  12/08/2024   DTaP/Tdap/Td (2 - Tdap) 04/12/2025   Pneumococcal Vaccine: 50+ Years  Completed   Meningococcal B Vaccine  Aged Out   DEXA SCAN   Discontinued   COVID-19 Vaccine  Discontinued   Zoster Vaccines- Shingrix  Discontinued        Assessment/Plan:  This is a routine wellness examination for [place].  Patient Care Team: Catherine Charlies LABOR, DO as PCP - General (Family Medicine) Jeffrie Oneil BROCKS, MD as PCP - Cardiology (Cardiology) Rudy Greig GAILS, OD (Optometry) Mat Browning, MD as Consulting Physician (Obstetrics and Gynecology)  I have personally reviewed and noted the following in the patient's chart:   Medical and social history Use of alcohol, tobacco or illicit drugs  Current medications and supplements including opioid prescriptions. Functional ability and status Nutritional status Physical activity Advanced directives List of other physicians Hospitalizations, surgeries, and ER visits in previous 12 months Vitals Screenings to include cognitive, depression, and falls Referrals and appointments  No orders of the defined types were placed in this encounter.  In addition, I have reviewed and discussed with patient certain preventive protocols, quality metrics, and best practice recommendations. A written personalized care plan for preventive services as well as general preventive health recommendations were provided to patient.   Essynce Munsch L Abygayle Deltoro, CMA   12/09/2023   Return in 1 year (on 12/08/2024).  After Visit Summary: (MyChart) Due to this being a telephonic visit, the after visit summary with patients personalized plan was offered to patient via MyChart   Nurse Notes: Patient had some questions about constipation today.  I have listed some informative information for patient today, that was placed in AVS.  Patient stated that she has had a mammogram this year.  I sent a request for records out today. Patient is up to date on all health maintenance.

## 2023-12-09 NOTE — Patient Instructions (Addendum)
 [name],  Thank you for taking the time for your Medicare Wellness Visit. I appreciate your continued commitment to your health goals. Please review the care plan we discussed, and feel free to reach out if I can assist you further.  Please note that Annual Wellness Visits do not include a physical exam. Some assessments may be limited, especially if the visit was conducted virtually. If needed, we may recommend an in-person follow-up with your provider.  Ongoing Care Seeing your primary care provider every 3 to 6 months helps us  monitor your health and provide consistent, personalized care. Next office visit 12/10/2023.  Aim for 30 minutes of exercise or brisk walking, 6-8 glasses of water, and 5 servings of fruits and vegetables each day.   Referrals If a referral was made during today's visit and you haven't received any updates within two weeks, please contact the referred provider directly to check on the status.  Recommended Screenings:  Health Maintenance  Topic Date Due   Breast Cancer Screening  03/11/2023   Flu Shot  08/28/2023   Medicare Annual Wellness Visit  12/08/2024   DTaP/Tdap/Td vaccine (2 - Tdap) 04/12/2025   Pneumococcal Vaccine for age over 39  Completed   Meningitis B Vaccine  Aged Out   DEXA scan (bone density measurement)  Discontinued   COVID-19 Vaccine  Discontinued   Zoster (Shingles) Vaccine  Discontinued       12/09/2023   10:56 AM  Advanced Directives  Does Patient Have a Medical Advance Directive? No  Would patient like information on creating a medical advance directive? No - Patient declined    Vision: Annual vision screenings are recommended for early detection of glaucoma, cataracts, and diabetic retinopathy. These exams can also reveal signs of chronic conditions such as diabetes and high blood pressure.  Dental: Annual dental screenings help detect early signs of oral cancer, gum disease, and other conditions linked to overall health,  including heart disease and diabetes.  Please see the attached documents for additional preventive care recommendations.

## 2023-12-10 ENCOUNTER — Encounter: Payer: Self-pay | Admitting: Family Medicine

## 2023-12-10 ENCOUNTER — Ambulatory Visit: Admitting: Family Medicine

## 2023-12-10 VITALS — BP 118/80 | HR 62 | Temp 97.9°F | Ht 62.0 in | Wt 146.8 lb

## 2023-12-10 DIAGNOSIS — I272 Pulmonary hypertension, unspecified: Secondary | ICD-10-CM

## 2023-12-10 DIAGNOSIS — Z23 Encounter for immunization: Secondary | ICD-10-CM

## 2023-12-10 DIAGNOSIS — Z1231 Encounter for screening mammogram for malignant neoplasm of breast: Secondary | ICD-10-CM | POA: Diagnosis not present

## 2023-12-10 DIAGNOSIS — Z0000 Encounter for general adult medical examination without abnormal findings: Secondary | ICD-10-CM

## 2023-12-10 DIAGNOSIS — E785 Hyperlipidemia, unspecified: Secondary | ICD-10-CM

## 2023-12-10 DIAGNOSIS — M8589 Other specified disorders of bone density and structure, multiple sites: Secondary | ICD-10-CM | POA: Diagnosis not present

## 2023-12-10 DIAGNOSIS — I5189 Other ill-defined heart diseases: Secondary | ICD-10-CM | POA: Insufficient documentation

## 2023-12-10 DIAGNOSIS — F419 Anxiety disorder, unspecified: Secondary | ICD-10-CM

## 2023-12-10 DIAGNOSIS — I77819 Aortic ectasia, unspecified site: Secondary | ICD-10-CM | POA: Insufficient documentation

## 2023-12-10 DIAGNOSIS — R7309 Other abnormal glucose: Secondary | ICD-10-CM

## 2023-12-10 DIAGNOSIS — F3342 Major depressive disorder, recurrent, in full remission: Secondary | ICD-10-CM

## 2023-12-10 LAB — LIPID PANEL
Cholesterol: 227 mg/dL — ABNORMAL HIGH (ref 0–200)
HDL: 72.2 mg/dL (ref >39.00)
LDL Cholesterol: 142 mg/dL — ABNORMAL HIGH (ref 0–99)
NonHDL: 155.2
Total CHOL/HDL Ratio: 3
Triglycerides: 64 mg/dL (ref 0.0–149.0)
VLDL: 12.8 mg/dL (ref 0.0–40.0)

## 2023-12-10 LAB — VITAMIN D 25 HYDROXY (VIT D DEFICIENCY, FRACTURES): VITD: 61.64 ng/mL (ref 30.00–100.00)

## 2023-12-10 LAB — CBC
HCT: 43.9 % (ref 36.0–46.0)
Hemoglobin: 14.8 g/dL (ref 12.0–15.0)
MCHC: 33.8 g/dL (ref 30.0–36.0)
MCV: 96.3 fl (ref 78.0–100.0)
Platelets: 202 K/uL (ref 150.0–400.0)
RBC: 4.55 Mil/uL (ref 3.87–5.11)
RDW: 13.4 % (ref 11.5–15.5)
WBC: 5.3 K/uL (ref 4.0–10.5)

## 2023-12-10 LAB — COMPREHENSIVE METABOLIC PANEL WITH GFR
ALT: 16 U/L (ref 0–35)
AST: 24 U/L (ref 0–37)
Albumin: 4.2 g/dL (ref 3.5–5.2)
Alkaline Phosphatase: 76 U/L (ref 39–117)
BUN: 12 mg/dL (ref 6–23)
CO2: 28 meq/L (ref 19–32)
Calcium: 9.4 mg/dL (ref 8.4–10.5)
Chloride: 102 meq/L (ref 96–112)
Creatinine, Ser: 0.74 mg/dL (ref 0.40–1.20)
GFR: 75.68 mL/min (ref >60.00)
Glucose, Bld: 81 mg/dL (ref 70–99)
Potassium: 4.1 meq/L (ref 3.5–5.1)
Sodium: 138 meq/L (ref 135–145)
Total Bilirubin: 0.7 mg/dL (ref 0.2–1.2)
Total Protein: 6.6 g/dL (ref 6.0–8.3)

## 2023-12-10 LAB — TSH: TSH: 2.62 u[IU]/mL (ref 0.35–5.50)

## 2023-12-10 LAB — HEMOGLOBIN A1C: Hgb A1c MFr Bld: 5.9 % (ref 4.6–6.5)

## 2023-12-10 MED ORDER — ATORVASTATIN CALCIUM 20 MG PO TABS: 20.0000 mg | ORAL_TABLET | ORAL | 3 refills | Status: AC

## 2023-12-10 MED ORDER — CITALOPRAM HYDROBROMIDE 40 MG PO TABS: 40.0000 mg | ORAL_TABLET | Freq: Every day | ORAL | 1 refills | Status: AC

## 2023-12-10 NOTE — Patient Instructions (Addendum)
 Return in about 25 weeks (around 06/02/2024) for Routine chronic condition follow-up.        Great to see you today.  I have refilled the medication(s) we provide.   If labs were collected or images ordered, we will inform you of  results once we have received them and reviewed. We will contact you either by echart message, or telephone call.  Please give ample time to the testing facility, and our office to run,  receive and review results. Please do not call inquiring of results, even if you can see them in your chart. We will contact you as soon as we are able. If it has been over 1 week since the test was completed, and you have not yet heard from us , then please call us .    - echart message- for normal results that have been seen by the patient already.   - telephone call: abnormal results or if patient has not viewed results in their echart.  If a referral to a specialist was entered for you, please call us  in 2 weeks if you have not heard from the specialist office to schedule.

## 2023-12-10 NOTE — Progress Notes (Signed)
 [name], female  DOB: [birth year], 83 y.o.   MRN: [id number] Patient Care Team    Relationship Specialty Notifications Start End  Catherine Charlies LABOR, DO PCP - General Family Medicine  03/03/17   Rudy Greig GAILS, OD  Optometry  03/03/17   Mat Browning, MD Consulting Physician Obstetrics and Gynecology  03/03/17   Jeffrie Oneil BROCKS, MD Consulting Physician Cardiology  12/10/23     Chief Complaint  Patient presents with   Annual Exam    Influenza vaccine- declined Chronic condition management Pt is fasting    Subjective:  Grace Fletcher is a 83 y.o.  Female  present for CPE and chronic Conditions/illness Management All past medical history, surgical history, allergies, family history, immunizations, medications and social history were updated in the electronic medical record today. All recent labs, ED visits and hospitalizations within the last year were reviewed.  Health maintenance:  Mammogram: completed:03/10/2022, prior h/o left breast cancer (stage IIa) lumpectomy, radiation and tamoxifen for 5 years. [employer] [address] > ordered Immunizations: tdap 03/28/2015, Influenza-declined(encouraged yearly), PNA completed. Shingrix declined DEXA:completed 2019 osteopenia -2.4- pt declined further imaging.  Assistive device: none Oxygen ldz:[id number] Patient has a Dental home. 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   She is established with cardiology.  Patient was tried on spironolactone  25 mg a day, but this more dehydration symptoms-dizziness, constipation excetra. She had a history of hypertension at 1 time was on metoprolol  and an ACE inhibitor, both of which were later discontinued secondary to hypotension. Latest  echocardiogram resulted with mild ascending aortic dilatation at 41 mm.  Cardiology is aware and will follow per note. Pt takes daily baby ASA.  Pt is  prescribed statin.that she does not take statin She has est with cardio.  Depression: Patient reports she was started on Celexa  many years ago when she was diagnosed with breast cancer.  She had discontinued until her son had become incarcerated.     Today she reports celexa  40 mg every day is helping a great deal and she would like to continue at the same dose.  Studies: Carotid artery studies 05/2023: Summary:  Right Carotid: The extracranial vessels were near-normal with only minimal  wall thickening or plaque.  Left Carotid: The extracranial vessels were near-normal with only minimal  wall thickening or plaque.  Vertebrals:  Bilateral vertebral arteries demonstrate antegrade flow.  Subclavians: Normal flow hemodynamics were seen in bilateral subclavian arteries.   Echocardiogram 05/2023: IMPRESSIONS   1. Left ventricular ejection fraction, by estimation, is 50 to 55%. The  left ventricle has low normal function. The left ventricle has no regional  wall motion abnormalities. Left ventricular diastolic parameters are  consistent with Grade II diastolic  dysfunction (pseudonormalization).   2. Right ventricular systolic function is low normal. The right  ventricular size is normal. There is moderately elevated pulmonary artery  systolic pressure. The estimated right ventricular systolic pressure is  47.1 mmHg.   3. Well seated atrial septal occluder without residual shunt.   4. The mitral valve is normal in structure. Mild to moderate mitral valve  regurgitation.   5. Tricuspid valve regurgitation is moderate.   6. The aortic valve is tricuspid. Aortic valve regurgitation is trivial.  No aortic stenosis is present.   7. There is mild dilatation of the ascending aorta, measuring  41 mm.   8. The inferior vena cava is normal in size  with greater than 50%  respiratory variability, suggesting right atrial pressure of 3 mmHg.        12/09/2023   11:08 AM 06/02/2023   10:24 AM 09/12/2022    1:10 PM 04/02/2022    1:10 PM 03/28/2022    1:16 PM  Depression screen PHQ 2/9  Decreased Interest 0 1 1 0 0  Down, Depressed, Hopeless 0 0 1 0 1  PHQ - 2 Score 0 1 2 0 1  Altered sleeping 0 1 2 0 1  Tired, decreased energy 0 1 2 0 1  Change in appetite 0 1 0 0 1  Feeling bad or failure about yourself  0 0 0    Trouble concentrating 0 0 0 0 1  Moving slowly or fidgety/restless 0 0 0 0 0  Suicidal thoughts 0 0 0 0 0  PHQ-9 Score 0 4  6  0  5   Difficult doing work/chores Not difficult at all Not difficult at all Not difficult at all       Data saved with a previous flowsheet row definition      06/02/2023   10:24 AM 09/12/2022    1:11 PM 03/28/2022    1:15 PM 10/11/2021    1:44 PM  GAD 7 : Generalized Anxiety Score  Nervous, Anxious, on Edge 1 1 1  0  Control/stop worrying 0 0 1 0  Worry too much - different things 0 0 0 0  Trouble relaxing 0 0 0 0  Restless 0 0 0 0  Easily annoyed or irritable 0 0 0 0  Afraid - awful might happen 0 0 0 0  Total GAD 7 Score 1 1 2  0  Anxiety Difficulty Not difficult at all Not difficult at all        03/28/2022    1:15 PM 04/02/2022    1:13 PM 09/12/2022    1:10 PM 06/02/2023   10:24 AM 12/09/2023   10:56 AM  Fall Risk  Falls in the past year? 0 0 0 0 0  Was there an injury with Fall?  0 0  0  Fall Risk Category Calculator  0 0  0  Patient at Risk for Falls Due to  Impaired vision;Impaired balance/gait   No Fall Risks  Fall risk Follow up Falls evaluation completed Falls prevention discussed  Falls evaluation completed Falls evaluation completed;Falls prevention discussed    Immunization History  Administered Date(s) Administered   DTaP 04/13/2015   Fluad Quad(high Dose 65+) 10/11/2021, 09/12/2022   Fluad Trivalent(High Dose 65+) 09/12/2022   INFLUENZA, HIGH DOSE SEASONAL PF 11/27/2016,  11/19/2017, 12/26/2018, 12/26/2018, 11/26/2020   Influenza,inj,Quad PF,6+ Mos 11/28/2018   PFIZER(Purple Top)SARS-COV-2 Vaccination 02/08/2019, 03/07/2019, 04/01/2019, 11/24/2019   Pfizer Covid-19 Vaccine Bivalent Booster 28yrs & up 11/26/2020   Pneumococcal Conjugate-13 02/07/2019   Pneumococcal Polysaccharide-23 11/19/2017   Past Medical History:  Diagnosis Date   Anxiety 05/26/2013   Arthritis    Breast cancer, left breast (HCC) 2006   Lumpectomy, radiation and tamoxifen for 5 years.   Chronic systolic heart failure (HCC) 05/14/2016   Colon polyp    Cyst of pancreas    Depression 05/26/2013   Migraines    Moderate tricuspid regurgitation by prior echocardiogram 05/13/2010   Osteopenia 12/04/2017   Pneumonia due to COVID-19 virus    Pulmonary hypertension (HCC) 05/13/2010   Pulmonary stenosis 05/13/2010   Study Conclusions  - Left  ventricle: The cavity size was normal. Systolic   function was mildly reduced. The estimated ejection   fraction was in the range of 45% to 50%. There is   hypokinesis of the anteroseptal myocardium. Doppler   parameters are consistent with abnormal left ventricular   relaxation (grade 1 diastolic dysfunction). - Right ventricle: The cavity size was mildly dilated. Wall   thickness was normal. Systolic function was mildly   reduced. - Right atrium: The atrium was mildly dilated. - Atrial septum: No atrial level shunt, at baseline with   Doppler analysis. Prior ASD repair, CardioSeal 2004. - Pulmonic valve: Transvalvular velocity was increased, due   to mild degree of stenosis. Peak velocity: 224cm/s (S). - Pulmonary arteries: Systolic pressure was mildly   increased.     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   CardioSEAL device, 2004; [facility]    No Known Allergies Past Surgical History:  Procedure Laterality Date   BREAST LUMPECTOMY     left breast cancer, status post lumpectomy, radiation and tamoxifen 5 years   CARPAL TUNNEL  RELEASE     CESAREAN SECTION     x4   SENTINEL LYMPH NODE BIOPSY     Family History  Problem Relation Age of Onset   Dementia Mother    Arthritis Mother    Heart attack Father    Heart disease Father    Hypertension Sister    Heart attack Paternal Grandmother    Heart disease Paternal Grandmother    Social History   Social History Narrative   Married. 4 children.    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   Takes a daily vitamin.   Smoke alarm in the home.   Wears her seatbelt.   Feels safe in her relationships.      Son lives with patient/2025    Allergies as of 12/10/2023   No Known Allergies      Medication List        Accurate as of December 10, 2023 11:08 AM. If you have any questions, ask your nurse or doctor.          aspirin 81 MG tablet Take 81 mg by mouth daily.   atorvastatin  20 MG tablet Commonly known as: LIPITOR Take 1 tablet (20 mg total) by mouth every other day.   citalopram  40 MG tablet Commonly known as: CELEXA  Take 1 tablet (40 mg total) by mouth daily.   docusate sodium 100 MG capsule Commonly known as: COLACE Take 100 mg by mouth daily.   multivitamin capsule Take 1 capsule by mouth daily.   Vitamin D  1000 units capsule Take 1,000 Units by mouth daily.        All past medical history, surgical history, allergies, family history, immunizations andmedications were updated in the EMR today and reviewed under the history and medication portions of their EMR.     ROS: 14 pt review of systems performed and negative (unless mentioned in an HPI) Review of Systems  All other systems reviewed and are negative.   Objective: BP 118/80   Pulse 62   Temp 97.9 F (36.6 C)   Ht 5' 2 (1.575 m)   Wt 146 lb 12.8 oz (66.6 kg)   SpO2 98%   BMI 26.85 kg/m  Physical Exam Vitals and nursing note reviewed.  Constitutional:      General: She is not in acute distress.    Appearance: Normal appearance. She is not ill-appearing,  toxic-appearing or diaphoretic.  HENT:  Head: Normocephalic and atraumatic.     Right Ear: Tympanic membrane, ear canal and external ear normal. There is no impacted cerumen.     Left Ear: Tympanic membrane, ear canal and external ear normal. There is no impacted cerumen.     Nose: No congestion or rhinorrhea.     Mouth/Throat:     Mouth: Mucous membranes are moist.     Pharynx: Oropharynx is clear. No oropharyngeal exudate or posterior oropharyngeal erythema.  Eyes:     General: No scleral icterus.       Right eye: No discharge.        Left eye: No discharge.     Extraocular Movements: Extraocular movements intact.     Conjunctiva/sclera: Conjunctivae normal.     Pupils: Pupils are equal, round, and reactive to light.  Cardiovascular:     Rate and Rhythm: Normal rate and regular rhythm.     Pulses: Normal pulses.     Heart sounds: Murmur heard.     No friction rub. No gallop.  Pulmonary:     Effort: Pulmonary effort is normal. No respiratory distress.     Breath sounds: Normal breath sounds. No stridor. No wheezing, rhonchi or rales.  Chest:     Chest wall: No tenderness.  Abdominal:     General: Abdomen is flat. Bowel sounds are normal. There is no distension.     Palpations: Abdomen is soft. There is no mass.     Tenderness: There is no abdominal tenderness. There is no right CVA tenderness, left CVA tenderness, guarding or rebound.     Hernia: No hernia is present.  Musculoskeletal:        General: No swelling, tenderness or deformity. Normal range of motion.     Cervical back: Normal range of motion and neck supple. No rigidity or tenderness.     Right lower leg: No edema.     Left lower leg: No edema.  Lymphadenopathy:     Cervical: No cervical adenopathy.  Skin:    General: Skin is warm and dry.     Coloration: Skin is not jaundiced or pale.     Findings: No bruising, erythema, lesion or rash.  Neurological:     General: No focal deficit present.     Mental  Status: She is alert and oriented to person, place, and time. Mental status is at baseline.     Cranial Nerves: No cranial nerve deficit.     Sensory: No sensory deficit.     Motor: No weakness.     Coordination: Coordination normal.     Gait: Gait normal.     Deep Tendon Reflexes: Reflexes normal.  Psychiatric:        Mood and Affect: Mood normal.        Behavior: Behavior normal.        Thought Content: Thought content normal.        Judgment: Judgment normal.     No results found.  Assessment/plan: Juliett Eastburn Yanko is a 83 y.o. female present for CPE and chronic Conditions/illness Management Depression, unspecified depression type/Anxiety Stable Continue Celexa  40 mg daily.  Diastolic dysfunction without heart failure/hyperlipidemia/statin therapy/overweight/pulmonary hypertension Stable -Continue baby aspirin. Continue lipitor Did not tolerate spirolactone.  Labs collected today  Osteopenia, unspecified location/edema D deficiency Continue vitamin D  1000 units daily.   Declined further bone density Vitamin D  levels collected today  Elevated hemoglobin A1c - Hemoglobin A1c Influenza vaccine needed declined Breast cancer screening by mammogram - MM  3D SCREENING MAMMOGRAM BILATERAL BREAST; Future  Aortic dilatation-41 mm ascending aorta Monitor yearly- cardio  Routine general medical examination at a health care facility (Primary) Patient was encouraged to exercise greater than 150 minutes a week. Patient was encouraged to choose a diet filled with fresh fruits and vegetables, and lean meats. AVS provided to patient today for education/recommendation on gender specific health and safety maintenance. Mammogram: completed:03/10/2022, prior h/o left breast cancer (stage IIa) lumpectomy, radiation and tamoxifen for 5 years. [address] > ordered Immunizations: tdap 03/28/2015, Influenza-declined (encouraged yearly), PNA completed. Shingrix declined DEXA:completed  2019 osteopenia -2.4- pt declined further imaging.    Return in about 25 weeks (around 06/02/2024) for Routine chronic condition follow-up.  Orders Placed This Encounter  Procedures   MM 3D SCREENING MAMMOGRAM BILATERAL BREAST   CBC   Comprehensive metabolic panel with GFR   Hemoglobin A1c   Lipid panel   TSH   Vitamin D  (25 hydroxy)   Meds ordered this encounter  Medications   citalopram  (CELEXA ) 40 MG tablet    Sig: Take 1 tablet (40 mg total) by mouth daily.    Dispense:  90 tablet    Refill:  1   atorvastatin  (LIPITOR) 20 MG tablet    Sig: Take 1 tablet (20 mg total) by mouth every other day.    Dispense:  45 tablet    Refill:  3   Referral Orders  No referral(s) requested today     Electronically signed by: Charlies Bellini, DO [facility] [place]

## 2023-12-11 ENCOUNTER — Ambulatory Visit: Payer: Self-pay | Admitting: Family Medicine

## 2023-12-11 MED ORDER — EZETIMIBE 10 MG PO TABS
10.0000 mg | ORAL_TABLET | Freq: Every day | ORAL | 3 refills | Status: AC
Start: 2023-12-11 — End: ?

## 2024-06-02 ENCOUNTER — Ambulatory Visit: Admitting: Family Medicine
